# Patient Record
Sex: Male | Born: 1950
Health system: Southern US, Community
[De-identification: ages and names within clinical notes are randomized; demographics above are authoritative.]

## PROBLEM LIST (undated history)

## (undated) DIAGNOSIS — Z87898 Personal history of other specified conditions: Secondary | ICD-10-CM

## (undated) DIAGNOSIS — Z0000 Encounter for general adult medical examination without abnormal findings: Secondary | ICD-10-CM

## (undated) DIAGNOSIS — F1991 Other psychoactive substance use, unspecified, in remission: Secondary | ICD-10-CM

## (undated) DIAGNOSIS — B182 Chronic viral hepatitis C: Secondary | ICD-10-CM

## (undated) DIAGNOSIS — Z8719 Personal history of other diseases of the digestive system: Secondary | ICD-10-CM

## (undated) DIAGNOSIS — K219 Gastro-esophageal reflux disease without esophagitis: Secondary | ICD-10-CM

## (undated) DIAGNOSIS — I10 Essential (primary) hypertension: Secondary | ICD-10-CM

## (undated) DIAGNOSIS — T7840XA Allergy, unspecified, initial encounter: Secondary | ICD-10-CM

## (undated) DIAGNOSIS — F1120 Opioid dependence, uncomplicated: Secondary | ICD-10-CM

## (undated) DIAGNOSIS — R2230 Localized swelling, mass and lump, unspecified upper limb: Secondary | ICD-10-CM

## (undated) DIAGNOSIS — IMO0001 Reserved for inherently not codable concepts without codable children: Secondary | ICD-10-CM

## (undated) DIAGNOSIS — M25512 Pain in left shoulder: Secondary | ICD-10-CM

## (undated) DIAGNOSIS — R030 Elevated blood-pressure reading, without diagnosis of hypertension: Secondary | ICD-10-CM

## (undated) DIAGNOSIS — L989 Disorder of the skin and subcutaneous tissue, unspecified: Secondary | ICD-10-CM

## (undated) HISTORY — DX: Personal history of other specified conditions: Z87.898

## (undated) HISTORY — DX: Opioid dependence, uncomplicated: F11.20

## (undated) HISTORY — DX: Encounter for general adult medical examination without abnormal findings: Z00.00

## (undated) HISTORY — DX: Pain in left shoulder: M25.512

## (undated) HISTORY — DX: Essential (primary) hypertension: I10

## (undated) HISTORY — DX: Gastro-esophageal reflux disease without esophagitis: K21.9

## (undated) HISTORY — DX: Other psychoactive substance use, unspecified, in remission: F19.91

## (undated) HISTORY — DX: Personal history of other diseases of the digestive system: Z87.19

## (undated) HISTORY — DX: Chronic viral hepatitis C: B18.2

## (undated) HISTORY — DX: Reserved for inherently not codable concepts without codable children: IMO0001

## (undated) HISTORY — DX: Allergy, unspecified, initial encounter: T78.40XA

## (undated) HISTORY — DX: Elevated blood-pressure reading, without diagnosis of hypertension: R03.0

## (undated) HISTORY — DX: Localized swelling, mass and lump, unspecified upper limb: R22.30

## (undated) HISTORY — DX: Disorder of the skin and subcutaneous tissue, unspecified: L98.9

---

## 1994-06-12 HISTORY — PX: KNEE ARTHROSCOPY: SUR90

## 2005-03-03 ENCOUNTER — Ambulatory Visit: Payer: Self-pay | Admitting: Internal Medicine

## 2005-03-21 ENCOUNTER — Ambulatory Visit: Payer: Self-pay | Admitting: Internal Medicine

## 2007-01-10 ENCOUNTER — Ambulatory Visit: Payer: Self-pay | Admitting: Internal Medicine

## 2007-01-10 LAB — CONVERTED CEMR LAB
ALT: 58 units/L — ABNORMAL HIGH (ref 0–53)
AST: 57 units/L — ABNORMAL HIGH (ref 0–37)
Albumin: 4.4 g/dL (ref 3.5–5.2)
Basophils Relative: 1.3 % — ABNORMAL HIGH (ref 0.0–1.0)
HCT: 40.3 % (ref 39.0–52.0)
HCV Quantitative: 1710000 intl units/mL — ABNORMAL HIGH (ref <5)
Hemoglobin: 14.1 g/dL (ref 13.0–17.0)
INR: 1 (ref 0.9–2.0)
Monocytes Absolute: 0.3 10*3/uL (ref 0.2–0.7)
Neutrophils Relative %: 51.1 % (ref 43.0–77.0)
Prothrombin Time: 12 s (ref 10.0–14.0)
RDW: 11.9 % (ref 11.5–14.6)
Total Bilirubin: 1.1 mg/dL (ref 0.3–1.2)

## 2007-02-07 ENCOUNTER — Ambulatory Visit: Payer: Self-pay | Admitting: Internal Medicine

## 2007-05-23 ENCOUNTER — Encounter: Payer: Self-pay | Admitting: Internal Medicine

## 2007-05-23 ENCOUNTER — Ambulatory Visit: Payer: Self-pay | Admitting: Gastroenterology

## 2007-07-31 ENCOUNTER — Ambulatory Visit (HOSPITAL_COMMUNITY): Admission: RE | Admit: 2007-07-31 | Discharge: 2007-07-31 | Payer: Self-pay | Admitting: Gastroenterology

## 2007-07-31 ENCOUNTER — Encounter (INDEPENDENT_AMBULATORY_CARE_PROVIDER_SITE_OTHER): Payer: Self-pay | Admitting: Interventional Radiology

## 2007-08-15 ENCOUNTER — Ambulatory Visit: Payer: Self-pay | Admitting: Gastroenterology

## 2007-11-21 ENCOUNTER — Ambulatory Visit: Payer: Self-pay | Admitting: Gastroenterology

## 2008-05-14 ENCOUNTER — Encounter: Payer: Self-pay | Admitting: Internal Medicine

## 2008-05-14 ENCOUNTER — Ambulatory Visit: Payer: Self-pay | Admitting: Gastroenterology

## 2008-07-19 ENCOUNTER — Emergency Department (HOSPITAL_COMMUNITY): Admission: EM | Admit: 2008-07-19 | Discharge: 2008-07-19 | Payer: Self-pay | Admitting: Emergency Medicine

## 2008-07-20 ENCOUNTER — Ambulatory Visit: Payer: Self-pay | Admitting: Internal Medicine

## 2008-07-20 DIAGNOSIS — B182 Chronic viral hepatitis C: Secondary | ICD-10-CM | POA: Insufficient documentation

## 2008-07-20 DIAGNOSIS — M545 Low back pain, unspecified: Secondary | ICD-10-CM | POA: Insufficient documentation

## 2008-08-20 ENCOUNTER — Ambulatory Visit: Payer: Self-pay | Admitting: Internal Medicine

## 2008-08-20 DIAGNOSIS — K1321 Leukoplakia of oral mucosa, including tongue: Secondary | ICD-10-CM | POA: Insufficient documentation

## 2008-08-20 DIAGNOSIS — K121 Other forms of stomatitis: Secondary | ICD-10-CM | POA: Insufficient documentation

## 2008-08-20 DIAGNOSIS — K1230 Oral mucositis (ulcerative), unspecified: Secondary | ICD-10-CM

## 2008-09-04 ENCOUNTER — Ambulatory Visit: Payer: Self-pay | Admitting: Internal Medicine

## 2008-11-12 ENCOUNTER — Ambulatory Visit: Payer: Self-pay | Admitting: Internal Medicine

## 2008-11-12 DIAGNOSIS — M25519 Pain in unspecified shoulder: Secondary | ICD-10-CM | POA: Insufficient documentation

## 2008-12-17 ENCOUNTER — Ambulatory Visit: Payer: Self-pay | Admitting: Internal Medicine

## 2008-12-29 ENCOUNTER — Encounter: Admission: RE | Admit: 2008-12-29 | Discharge: 2009-01-19 | Payer: Self-pay | Admitting: Orthopedic Surgery

## 2009-01-01 ENCOUNTER — Telehealth: Payer: Self-pay | Admitting: Internal Medicine

## 2009-03-26 ENCOUNTER — Ambulatory Visit: Payer: Self-pay | Admitting: Internal Medicine

## 2009-06-07 ENCOUNTER — Telehealth: Payer: Self-pay | Admitting: Internal Medicine

## 2009-06-08 ENCOUNTER — Telehealth: Payer: Self-pay | Admitting: Internal Medicine

## 2009-09-27 ENCOUNTER — Ambulatory Visit: Payer: Self-pay | Admitting: Internal Medicine

## 2009-09-28 ENCOUNTER — Ambulatory Visit (HOSPITAL_BASED_OUTPATIENT_CLINIC_OR_DEPARTMENT_OTHER): Admission: RE | Admit: 2009-09-28 | Discharge: 2009-09-28 | Payer: Self-pay | Admitting: Internal Medicine

## 2009-09-28 ENCOUNTER — Telehealth: Payer: Self-pay | Admitting: Internal Medicine

## 2009-09-28 ENCOUNTER — Ambulatory Visit: Payer: Self-pay | Admitting: Diagnostic Radiology

## 2009-09-28 ENCOUNTER — Encounter: Payer: Self-pay | Admitting: Internal Medicine

## 2009-09-28 DIAGNOSIS — D376 Neoplasm of uncertain behavior of liver, gallbladder and bile ducts: Secondary | ICD-10-CM | POA: Insufficient documentation

## 2009-09-28 LAB — CONVERTED CEMR LAB
Albumin: 4.9 g/dL (ref 3.5–5.2)
BUN: 13 mg/dL (ref 6–23)
CO2: 19 meq/L (ref 19–32)
Calcium: 9.9 mg/dL (ref 8.4–10.5)
Chloride: 103 meq/L (ref 96–112)
Creatinine, Ser: 1.21 mg/dL (ref 0.40–1.50)
Eosinophils Absolute: 0 10*3/uL (ref 0.0–0.7)
Eosinophils Relative: 1 % (ref 0–5)
Glucose, Bld: 99 mg/dL (ref 70–99)
HCT: 45.2 % (ref 39.0–52.0)
Hemoglobin: 15.2 g/dL (ref 13.0–17.0)
Lymphs Abs: 1.1 10*3/uL (ref 0.7–4.0)
MCV: 90.9 fL (ref 78.0–100.0)
Monocytes Absolute: 0.2 10*3/uL (ref 0.1–1.0)
Monocytes Relative: 7 % (ref 3–12)
Platelets: 120 10*3/uL — ABNORMAL LOW (ref 150–400)
Potassium: 4.5 meq/L (ref 3.5–5.3)
WBC: 3.3 10*3/uL — ABNORMAL LOW (ref 4.0–10.5)

## 2009-09-29 ENCOUNTER — Encounter: Payer: Self-pay | Admitting: Internal Medicine

## 2009-09-29 ENCOUNTER — Ambulatory Visit (HOSPITAL_BASED_OUTPATIENT_CLINIC_OR_DEPARTMENT_OTHER): Admission: RE | Admit: 2009-09-29 | Discharge: 2009-09-29 | Payer: Self-pay | Admitting: Internal Medicine

## 2009-09-29 ENCOUNTER — Ambulatory Visit: Payer: Self-pay | Admitting: Diagnostic Radiology

## 2009-09-29 LAB — CONVERTED CEMR LAB: AFP-Tumor Marker: 4.9 ng/mL (ref 0.0–8.0)

## 2009-10-04 ENCOUNTER — Encounter: Payer: Self-pay | Admitting: Internal Medicine

## 2009-10-04 ENCOUNTER — Telehealth (INDEPENDENT_AMBULATORY_CARE_PROVIDER_SITE_OTHER): Payer: Self-pay | Admitting: *Deleted

## 2010-01-26 ENCOUNTER — Ambulatory Visit: Payer: Self-pay | Admitting: Internal Medicine

## 2010-02-11 ENCOUNTER — Encounter: Payer: Self-pay | Admitting: Internal Medicine

## 2010-04-12 ENCOUNTER — Encounter: Payer: Self-pay | Admitting: Internal Medicine

## 2010-04-12 LAB — CONVERTED CEMR LAB
BUN: 12 mg/dL (ref 6–23)
Creatinine, Ser: 1.11 mg/dL (ref 0.40–1.50)

## 2010-04-16 ENCOUNTER — Ambulatory Visit: Payer: Self-pay | Admitting: Diagnostic Radiology

## 2010-04-16 ENCOUNTER — Ambulatory Visit (HOSPITAL_BASED_OUTPATIENT_CLINIC_OR_DEPARTMENT_OTHER): Admission: RE | Admit: 2010-04-16 | Discharge: 2010-04-16 | Payer: Self-pay | Admitting: Internal Medicine

## 2010-04-18 ENCOUNTER — Telehealth: Payer: Self-pay | Admitting: Internal Medicine

## 2010-06-10 ENCOUNTER — Telehealth: Payer: Self-pay | Admitting: Internal Medicine

## 2010-07-03 ENCOUNTER — Encounter: Payer: Self-pay | Admitting: Gastroenterology

## 2010-07-04 ENCOUNTER — Encounter: Payer: Self-pay | Admitting: Internal Medicine

## 2010-07-12 NOTE — Progress Notes (Signed)
Summary: MRI Results  Phone Note Outgoing Call   Summary of Call: call pt - MRI of liver - negative for liver cancer Initial call taken by: D. Thomos Lemons DO,  April 18, 2010 2:10 PM  Follow-up for Phone Call        call placed to patient at [phone]. Patients wife Lynden Ang states patient was at work. She has been advised per Dr Artist Pais instructions, and states she will inform patient when he returns home Follow-up by: Glendell Docker CMA,  April 18, 2010 3:38 PM

## 2010-07-12 NOTE — Assessment & Plan Note (Signed)
Summary: 3 month follow up/mhf   Vital Signs:  Patient profile:   60 year old male Weight:      166.25 pounds BMI:     22.63 O2 Sat:      98 % on Room air Temp:     98.2 degrees F oral Pulse rate:   71 / minute Pulse rhythm:   regular Resp:     16 per minute BP sitting:   128 / 70  (right arm) Cuff size:   regular  Vitals Entered By: Glendell Docker CMA (January 26, 2010 8:27 AM)  O2 Flow:  Room air CC: 3 Month Follow up  Is Patient Diabetic? No Pain Assessment Patient in pain? no      Comments No concerns, medication for mouth sores are working well   Primary Care Ichael Pullara:  D. Thomos Lemons DO  CC:  3 Month Follow up .  History of Present Illness: 60 y/o white male with hep c for f/u surveillance liver US was abnormal ,  abd MRI obtained MRI of abd IMPRESSION: 1.   Motion degraded exam.  The motion is most severe involving the arterial phase postcontrast series.  This decreases sensitivity for dysplastic nodule or early hepatocellular carcinoma. 2.  Given this factor, 2 hepatic hemangiomas without evidence of cirrhosis or suspicious liver lesion. 3.   Intra and extrahepatic biliary ductal dilatation without cause identified.  Correlate with bilirubin levels.  If these are elevated, consider further evaluation with ERCP to exclude ampullary stenosis or otherwise occult ampullary lesion.  bilirubin levels normal.  AFP levels normal feels well overall.   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   Eyes:  pupils equal, pupils round, and pupils reactive to light.   Lungs:  normal respiratory effort and normal breath sounds.   Heart:  normal rate, regular rhythm, and no gallop.   Abdomen:  soft, non-tender, no masses, no hepatomegaly, and no splenomegaly.     Impression & Recommendations:  Problem # 1:  HEPATITIS C, CHRONIC (ICD-070.54) Limited MRI of Abd in 09/2009.  AFP level normal. Bilirubin level normal  plan - repeat MRI of abd in Oct. we discussed new medication for tx of hep c.  refer again to Hep C clinic IMPRESSION: 1.   Motion degraded exam.  The motion is most severe involving the arterial phase postcontrast series.  This decreases sensitivity for dysplastic nodule or early hepatocellular carcinoma. 2.  Given this factor, 2 hepatic hemangiomas without evidence of cirrhosis or suspicious liver lesion. 3.   Intra and extrahepatic biliary ductal dilatation without cause identified.  Correlate with bilirubin levels.  If these are elevated, consider further evaluation with ERCP to exclude ampullary stenosis or otherwise occult ampullary lesion. Orders: Misc. Referral (Misc. Ref)  Problem # 2:  LEUKOPLAKIA OF ORAL MUCOSA INCLUDING TONGUE (ICD-528.6) Assessment: Improved  Problem # 3:  LIVER MASS (ICD-235.3) MRI of liver 09/2009 showed. plan on repeat MRI of abd next year and AFP  IMPRESSION: 1.   Motion degraded exam.  The motion is most severe involving the arterial phase postcontrast series.  This decreases sensitivity for dysplastic nodule or early hepatocellular carcinoma. 2.  Given this factor, 2 hepatic hemangiomas without evidence of cirrhosis or suspicious liver lesion. 3.   Intra and extrahepatic biliary ductal dilatation without cause identified.  Correlate with bilirubin levels.  If these are elevated, consider further evaluation with ERCP to exclude ampullary stenosis or otherwise occult ampullary lesion.  Complete Medication  List: 1)  Methadone Hcl 10 Mg/30ml Soln (Methadone hcl) .... 14mg  by mouth once daily 2)  Advil 200 Mg Tabs (Ibuprofen) .... Take 1 tablet by mouth three times a day as needed 3)  Fluocinonide 0.05 % Gel (Fluocinonide) .... Use two times a day as directed 4)  Lidocaine Viscous 2 % Soln (Lidocaine hcl) .... 5 ml two times a day as needed (swish and spit)  Patient Instructions: 1)  Please schedule a follow-up appointment in 6 months. Prescriptions: LIDOCAINE VISCOUS 2 % SOLN (LIDOCAINE HCL) 5 ml two times a day as needed (swish and spit)  #60 ml x 3   Entered and Authorized by:   D. Thomos Lemons DO   Signed by:   D. Thomos Lemons DO on 01/26/2010   Method used:   Electronically to        [facility]* (retail)       [facility]. [address]       [place], [place]  [address]       Ph: [phone] or [phone]       Fax: [phone]   RxID:   [id number] FLUOCINONIDE 0.05 % GEL (FLUOCINONIDE) use two times a day as directed  #30 x 1   Entered and Authorized by:   D. Thomos Lemons DO   Signed by:   D. Thomos Lemons DO on 01/26/2010   Method used:   Electronically to        [facility]* (retail)       [facility]. [address]       [place], [place]  [address]       Ph: [phone] or [phone]       Fax: [phone]   RxID:   [id number]   Current Allergies (reviewed today): No known allergies

## 2010-07-12 NOTE — Assessment & Plan Note (Signed)
Summary: 6 mo. f/u - jr   Vital Signs:  Patient profile:   60 year old [name] Height:      72 inches Weight:      171.25 pounds BMI:     23.31 O2 Sat:      98 % on Room air Temp:     98.0 degrees F oral Pulse rate:   75 / minute Pulse rhythm:   regular Resp:     16 per minute BP sitting:   136 / 70  (right arm) Cuff size:   large  Vitals Entered By: Glendell Docker CMA (September 27, 2009 9:51 AM)  O2 Flow:  Room air CC: Rm 3- 6 Month Follow up  Comments no changes per patient   Primary Care Provider:  Dondra Spry DO  CC:  Rm 3- 6 Month Follow up .  History of Present Illness: Shaun [name] with chronic hep c for f/u still has interittent issues with mouth soreness  hep c - does not have reg f/u with hep c clinic no jaundice,  no wt changes,  no anorexia  Preventive Screening-Counseling & Management  Alcohol-Tobacco     Smoking Status: never  Allergies (verified): No Known Drug Allergies  Past History:  Past Medical History: Chronic Hepatitis C  Hx of IV Drug use    Negative HIV screening the past.     Social History: Married Current Smoker  Alcohol use-no   Occupation: [employer]      Physical Exam  General:  alert, well-developed, and well-nourished.   Lungs:  normal respiratory effort and normal breath sounds.   Heart:  normal rate, regular rhythm, and no gallop.   Abdomen:  soft, non-tender, no masses, no hepatomegaly, and no splenomegaly.     Impression & Recommendations:  Problem # 1:  HEPATITIS C, CHRONIC (ICD-070.54) He has not followed with Hep C clinic.  follow LFTs.  screen for [facility] with alpha feto protein and liver US Orders: T-Comprehensive Metabolic Panel ([id number]) T-CBC w/Diff ([id number]) T- * Misc. Laboratory test [phone]) Ultrasound (Ultrasound)  Problem # 2:  LEUKOPLAKIA OF ORAL MUCOSA INCLUDING TONGUE (ICD-528.6) Pt likely has licen planus.  He has intermittent soreness of the mouth and tongue.  trial of fluocinonide  gel  Complete Medication List: 1)  Methadone Hcl 10 Mg/6ml Soln (Methadone hcl) .... 14mg  by mouth once daily 2)  Advil 200 Mg Tabs (Ibuprofen) .... Take 1 tablet by mouth three times a day as needed 3)  Fluocinonide 0.05 % Gel (Fluocinonide) .... Use two times a day as directed  Patient Instructions: 1)  Please schedule a follow-up appointment in 3 months. 2)  Call our office if sores in the mouth get worse Prescriptions: FLUOCINONIDE 0.05 % GEL (FLUOCINONIDE) use two times a day as directed  #30 grams x 3   Entered and Authorized by:   D. Thomos Lemons DO   Signed by:   D. Thomos Lemons DO on 09/27/2009   Method used:   Electronically to        [facility]. [phone]* (retail)       [address]       [place], [place]  [address]       Ph: [phone]       Fax: [phone]   RxID:   [id number]   Current Allergies (reviewed today): No known allergies

## 2010-07-12 NOTE — Progress Notes (Signed)
Summary: Lab Testing  ---- Converted from flag ---- ---- 09/28/2009 9:25 AM, D. Thomos Lemons DO wrote: please make sure alpha fetoprotein lab order completed ------------------------------  Phone Note Outgoing Call Call back at [phone]   Call placed by: Glendell Docker CMA,  September 28, 2009 10:11 AM Call placed to: [facility] Summary of Call: Spoke with Dorene Grebe with [facility] she states the test for Hepatitis C  Reba is currently not being performed anywhere in the [place] and a letter regarding that shoudl be received by our office. She did state  there  alternative tests;  Hepatitis antibody, or pcr qualitative or quantitative could be performed. She also states that Alpha fetoprotein was not found on any of the patients lab orders, however the test could be added. If added a diagnosis will be needed.  Initial call taken by: Glendell Docker CMA,  September 28, 2009 10:15 AM  Follow-up for Phone Call        I just need alpha fetoprotein.  use hep c code Follow-up by: D. Thomos Lemons DO,  September 28, 2009 4:49 PM  Additional Follow-up for Phone Call Additional follow up Details #1::        spoke with Marylene Land at [facility] for the [employer] added ([id number]) Additional Follow-up by: Glendell Docker CMA,  September 29, 2009 10:25 AM

## 2010-07-12 NOTE — Miscellaneous (Signed)
Summary: BUN/Creatinine prior to MRI  Clinical Lists Changes  Orders: Added new Test order of T-BUN [phone]) - Signed Added new Test order of T-Creatinine Blood [phone]) - Signed

## 2010-07-12 NOTE — Progress Notes (Signed)
Summary: MRI Resuts  Phone Note Call from Patient Call back at Work Phone [phone]   Caller: Patient Call For: D. Thomos Lemons DO Reason for Call: Talk to Nurse Summary of Call: Pt is anxious to know results from MRI, pls call to advise Initial call taken by: Lannette Donath,  October 04, 2009 2:27 PM  Follow-up for Phone Call        MRI of liver suboptimal quality due to motion degradation but no discrete mass identified.  I suggest repeat MRI of abd in 6 months alpha feto protein - tumor marker was normal Follow-up by: D. Thomos Lemons DO,  October 04, 2009 3:03 PM  Additional Follow-up for Phone Call Additional follow up Details #1::        patient advised per Dr Artist Pais instructions Additional Follow-up by: Glendell Docker CMA,  October 05, 2009 11:29 AM

## 2010-07-12 NOTE — Progress Notes (Signed)
Summary: Test Results  Phone Note Outgoing Call   Summary of Call: call pt - u/s of liver shows abnormality - could be hemangioma (benign tumor) but given hx of hep c, radiologist recommends MRI of liver.  see orders Initial call taken by: D. Thomos Lemons DO,  September 28, 2009 4:51 PM  Follow-up for Phone Call        patient advised per Dr Artist Pais instructions  Follow-up by: Glendell Docker CMA,  September 29, 2009 10:37 AM  New Problems: LIVER MASS (ICD-235.3)   New Problems: LIVER MASS (ICD-235.3)

## 2010-07-14 NOTE — Progress Notes (Signed)
Summary: Fluocinonide Gel Refill  Phone Note Refill Request Message from:  Fax from Pharmacy on June 10, 2010 2:56 PM  Refills Requested: Medication #1:  FLUOCINONIDE 0.05 % GEL use two times a day as directed   Dosage confirmed as above?Dosage Confirmed   Brand Name Necessary? No   Supply Requested: 1 month   Last Refilled: 05/03/2010 [facility] [address] [place] [phone]    Method Requested: Electronic Next Appointment Scheduled: 07-20-10 Dr Artist Pais  Initial call taken by: Roselle Locus,  June 10, 2010 2:57 PM  Follow-up for Phone Call        Rx completed in Dr. Tiajuana Amass Follow-up by: Glendell Docker CMA,  June 10, 2010 3:38 PM    Prescriptions: FLUOCINONIDE 0.05 % GEL (FLUOCINONIDE) use two times a day as directed  #30 x 0   Entered by:   Glendell Docker CMA   Authorized by:   D. Thomos Lemons DO   Signed by:   Glendell Docker CMA on 06/10/2010   Method used:   Electronically to        [facility]* (retail)       [address] [place]       [place], [place]  [address]       Ph: [phone] or [phone]       Fax: [phone]   RxID:   [id number]

## 2010-07-20 ENCOUNTER — Encounter: Payer: Self-pay | Admitting: Internal Medicine

## 2010-07-20 ENCOUNTER — Ambulatory Visit (INDEPENDENT_AMBULATORY_CARE_PROVIDER_SITE_OTHER): Payer: 59 | Admitting: Internal Medicine

## 2010-07-20 DIAGNOSIS — K1321 Leukoplakia of oral mucosa, including tongue: Secondary | ICD-10-CM

## 2010-07-20 DIAGNOSIS — B182 Chronic viral hepatitis C: Secondary | ICD-10-CM

## 2010-07-20 LAB — CONVERTED CEMR LAB
AFP-Tumor Marker: 4.4 ng/mL (ref 0.0–8.0)
ALT: 55 units/L — ABNORMAL HIGH (ref 0–53)
Alkaline Phosphatase: 54 units/L (ref 39–117)
Bilirubin, Direct: 0.2 mg/dL (ref 0.0–0.3)
CO2: 27 meq/L (ref 19–32)
Chloride: 101 meq/L (ref 96–112)
Creatinine, Ser: 1.1 mg/dL (ref 0.40–1.50)
HCV Quantitative: 2400000 intl units/mL — ABNORMAL HIGH (ref <43)
Indirect Bilirubin: 0.5 mg/dL (ref 0.0–0.9)
Platelets: 124 10*3/uL — ABNORMAL LOW (ref 150–400)
RDW: 12.6 % (ref 11.5–15.5)
Sodium: 140 meq/L (ref 135–145)
Total Protein: 7.2 g/dL (ref 6.0–8.3)
WBC: 3 10*3/uL — ABNORMAL LOW (ref 4.0–10.5)

## 2010-07-21 ENCOUNTER — Encounter (INDEPENDENT_AMBULATORY_CARE_PROVIDER_SITE_OTHER): Payer: Self-pay | Admitting: *Deleted

## 2010-07-26 ENCOUNTER — Encounter: Payer: Self-pay | Admitting: Internal Medicine

## 2010-07-26 ENCOUNTER — Encounter (INDEPENDENT_AMBULATORY_CARE_PROVIDER_SITE_OTHER): Payer: Self-pay | Admitting: *Deleted

## 2010-07-28 ENCOUNTER — Encounter: Payer: Self-pay | Admitting: Internal Medicine

## 2010-07-28 NOTE — Letter (Signed)
Summary: Pre Visit Letter Revised  [facility]  [address] [place], [place] [address]   Phone: [phone]  Fax: [phone]        07/21/2010 MRN: [id number] Shaun [name] [address] [place], [place]  [address]  [place]             Procedure Date:  August 11, 2010   dir col-Dr Dalene Carrow to the Gastroenterology Division at [facility].    You are scheduled to see a nurse for your pre-procedure visit on July 28, 2010 at 11:00am on the 3rd floor at [facility], [address]  We ask that you try to arrive at our office 15 minutes prior to your appointment time to allow for check-in.  Please take a minute to review the attached form.  If you answer "Yes" to one or more of the questions on the first page, we ask that you call the person listed at your earliest opportunity.  If you answer "No" to all of the questions, please complete the rest of the form and bring it to your appointment.    Your nurse visit will consist of discussing your medical and surgical history, your immediate family medical history, and your medications.   If you are unable to list all of your medications on the form, please bring the medication bottles to your appointment and we will list them.  We will need to be aware of both prescribed and over the counter drugs.  We will need to know exact dosage information as well.    Please be prepared to read and sign documents such as consent forms, a financial agreement, and acknowledgement forms.  If necessary, and with your consent, a friend or relative is welcome to sit-in on the nurse visit with you.  Please bring your insurance card so that we may make a copy of it.  If your insurance requires a referral to see a specialist, please bring your referral form from your primary care physician.  No co-pay is required for this nurse visit.     If you cannot keep your appointment, please call [phone] to cancel or reschedule  prior to your appointment date.  This allows [place] the opportunity to schedule an appointment for another patient in need of care.    Thank you for choosing  Gastroenterology for your medical needs.  We appreciate the opportunity to care for you.  Please visit [place] at our website  to learn more about our practice.  Sincerely, The Gastroenterology Division

## 2010-07-28 NOTE — Assessment & Plan Note (Signed)
Summary: 6 month follow up/mhf   Vital Signs:  Patient profile:   60 year old male Height:      72 inches Weight:      171 pounds BMI:     23.28 O2 Sat:      97 % on Room air Temp:     98.4 degrees F oral Pulse rate:   67 / minute Resp:     18 per minute BP sitting:   130 / 70  (right arm) Cuff size:   regular  Vitals Entered By: Glendell Docker CMA (July 20, 2010 8:28 AM)  O2 Flow:  Room air CC: 6 Month Follow up  Is Patient Diabetic? No Pain Assessment Patient in pain? no         Primary Care Provider:  Dondra Spry DO  CC:  6 Month Follow up .  History of Present Illness:       This is a 60 year old male who presents with Hepatitis C follow-up.  The patient complains of no newsymptoms, but denies weight loss, weight gain, and abdominal pain.  Evaluation to date has included Hepatitis serology, HIV testing, HCV viral level, and MRI.    pt seen by hep c clinic in [place] in the past.  pt unable to get appt due to waiting list    Preventive Screening-Counseling & Management  Alcohol-Tobacco     Smoking Status: never  Allergies (verified): No Known Drug Allergies  Past History:  Past Medical History: Chronic Hepatitis C  Hx of IV Drug use     Negative HIV screening the past.      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   Head:  normocephalic and atraumatic.   Eyes:  pupils equal, pupils round, and pupils reactive to light.  no scleral icterus Mouth:  pharynx pink and moist.   Lungs:  normal respiratory effort and normal breath sounds.   Heart:  normal rate, regular rhythm, no murmur, and no gallop.     Impression &  Recommendations:  Problem # 1:  HEPATITIS C, CHRONIC (ICD-070.54) Pt unable to get appt at Hep C clinic in [place] refer to Dr. Marcelene Butte in [place]. Hep C genotype 1 obtain updated LFTs,  viral load, and AFP pt previously vaccinated for Hep A and Hep B (pt to forward copies of vaccine records) pt given copy of last MRI of Liver to bring with him to specialist appt  Orders: Gastroenterology Referral (GI) T-Basic Metabolic Panel [phone]) T-Hepatic Function [phone]) T-CBC No Diff ([id number]) T-Hepatitis C Viral Load ([id number]) T- * Misc. Laboratory test [id number])  Problem # 2:  STOMATITIS AND MUCOSITIS UNSPECIFIED (ICD-528.00) Assessment: Unchanged controlled with use of fluocinonide gel pt will call when he needs refill  Problem # 3:  PREVENTIVE HEALTH CARE (ICD-V70.0) refer to GI for screening colonoscopy  Orders: Gastroenterology Referral (GI)  Td Booster: Tdap (03/26/2009)   Flu Vax: Fluvax Non-MCR (03/26/2009)   Pneumovax: Pneumovax (07/20/2008)  Complete Medication List: 1)  Methadone Hcl 10 Mg/74ml Soln (Methadone hcl) .... 14mg  by mouth once daily 2)  Advil 200 Mg Tabs (Ibuprofen) .... Take 1  tablet by mouth three times a day as needed 3)  Fluocinonide 0.05 % Gel (Fluocinonide) .... Use two times a day as directed 4)  Lidocaine Viscous 2 % Soln (Lidocaine hcl) .... 5 ml two times a day as needed (swish and spit)  Other Orders: T-PSA ([id number])  Patient Instructions: 1)  Please schedule a follow-up appointment in 6 months.   Orders Added: 1)  Gastroenterology Referral [GI] 2)  T-Basic Metabolic Panel [[id number]] 3)  T-Hepatic Function [[id number]] 4)  T-CBC No Diff [[id number]] 5)  T-Hepatitis C Viral Load [[id number]] 6)  T- * Misc. Laboratory test [99999] 7)  T-PSA [phone] 8)  Gastroenterology Referral [GI] 9)  Est. Patient Level III [30865]    Current Allergies (reviewed today): No known allergies

## 2010-08-03 NOTE — Letter (Signed)
Summary: [facility] Instructions  [facility]  [address] [place], [place] [address]   Phone: [phone]  Fax: [phone]       Shaun [name]    11-19-1950    MRN: [id number]        Procedure Day /Date:  Friday 08/12/10     Arrival Time: 1:00pm      Procedure Time:  2:00pm     Location of Procedure:                    [facility] _  [facility] (4th Floor)                       PREPARATION FOR COLONOSCOPY WITH MOVIPREP   Starting 5 days prior to your procedure  SATURDAY 02/25  do not eat nuts, seeds, popcorn, corn, beans, peas,  salads, or any raw vegetables.  Do not take any fiber supplements (e.g. Metamucil, Citrucel, and Benefiber).  THE DAY BEFORE YOUR PROCEDURE         DATE: Thursday 3/1  1.  Drink clear liquids the entire day-NO SOLID FOOD  2.  Do not drink anything colored red or purple.  Avoid juices with pulp.  No orange juice.  3.  Drink at least 64 oz. (8 glasses) of fluid/clear liquids during the day to prevent dehydration and help the prep work efficiently.  CLEAR LIQUIDS INCLUDE: Water Jello Ice Popsicles Tea (sugar ok, no milk/cream) Powdered fruit flavored drinks Coffee (sugar ok, no milk/cream) Gatorade Juice: apple, white grape, white cranberry  Lemonade Clear bullion, consomm, broth Carbonated beverages (any kind) Strained chicken noodle soup Hard Candy                             4.  In the morning, mix first dose of MoviPrep solution:    Empty 1 Pouch A and 1 Pouch B into the disposable container    Add lukewarm drinking water to the top line of the container. Mix to dissolve    Refrigerate (mixed solution should be used within 24 hrs)  5.  Begin drinking the prep at 5:00 p.m. The MoviPrep container is divided by 4 marks.   Every 15 minutes drink the solution down to the next mark (approximately 8 oz) until the full liter is complete.   6.  Follow completed prep with 16 oz of clear liquid of your choice  (Nothing red or purple).  Continue to drink clear liquids until bedtime.  7.  Before going to bed, mix second dose of MoviPrep solution:    Empty 1 Pouch A and 1 Pouch B into the disposable container    Add lukewarm drinking water to the top line of the container. Mix to dissolve    Refrigerate  THE DAY OF YOUR PROCEDURE      DATE: Friday 3/2  Beginning at  9:00 a.m. (5 hours before procedure):         1. Every 15 minutes, drink the solution down to the next mark (approx 8 oz) until the full liter is complete.  2. Follow completed prep with 16 oz. of clear liquid of your choice.    3. You may drink clear liquids until  12:00pm  (2 HOURS BEFORE PROCEDURE).   MEDICATION INSTRUCTIONS  Unless otherwise instructed, you should take regular prescription medications with a small sip of water   as early as possible the  morning of your procedure.        OTHER INSTRUCTIONS  You will need a responsible adult at least 61 years of age to accompany you and drive you home.   This person must remain in the waiting room during your procedure.  Wear loose fitting clothing that is easily removed.  Leave jewelry and other valuables at home.  However, you may wish to bring a book to read or  an iPod/MP3 player to listen to music as you wait for your procedure to start.  Remove all body piercing jewelry and leave at home.  Total time from sign-in until discharge is approximately 2-3 hours.  You should go home directly after your procedure and rest.  You can resume normal activities the  day after your procedure.  The day of your procedure you should not:   Drive   Make legal decisions   Operate machinery   Drink alcohol   Return to work  You will receive specific instructions about eating, activities and medications before you leave.    The above instructions have been reviewed and explained to me by   Ezra Sites RN  July 28, 2010 11:32 AM    I fully understand and  can verbalize these instructions _____________________________ Date _________

## 2010-08-03 NOTE — Letter (Signed)
   [place] at [facility] [address] [place], [place]  [address]  [place] Phone: [phone]      July 26, 2010   FLEET HIGHAM [address] [place], [place] [address]  RE:  LAB RESULTS  Dear  Mr. HOH,  The following is an interpretation of your most recent lab tests.  Please take note of any instructions provided or changes to medications that have resulted from your lab work.  PSA:  normal - no follow-up needed PSA: 0.67  ELECTROLYTES:  Good - no changes needed  KIDNEY FUNCTION TESTS:  Good - no changes needed  LIVER FUNCTION TESTS:  Stable - no changes needed  CBC:  Stable - no changes needed  AFP ( tumor marker ) - normal       Sincerely Yours,    Dr. Thomos Lemons  Appended Document:  mailed

## 2010-08-03 NOTE — Miscellaneous (Signed)
Summary: [facility]- pt needs Propofol  Clinical Lists Changes  Medications: Added new medication of MOVIPREP 100 GM  SOLR (PEG-KCL-NACL-NASULF-NA ASC-C) As per prep instructions. - Signed Rx of MOVIPREP 100 GM  SOLR (PEG-KCL-NACL-NASULF-NA ASC-C) As per prep instructions.;  #1 x 0;  Signed;  Entered by: Ezra Sites RN;  Authorized by: Hart Carwin MD;  Method used: Electronically to [facility]*, [address], [place], [place]  [address], Ph: [phone] or [phone], Fax: [phone] Observations: Added new observation of NKA: T (07/28/2010 10:44)  Pt has been taking Methadone for 20 years because of history of IV drug use.  Pt scheduled for colonoscopy w/ Propofol 08/12/2010.  Prescriptions: MOVIPREP 100 GM  SOLR (PEG-KCL-NACL-NASULF-NA ASC-C) As per prep instructions.  #1 x 0   Entered by:   Ezra Sites RN   Authorized by:   Hart Carwin MD   Signed by:   Ezra Sites RN on 07/28/2010   Method used:   Electronically to        [facility]* (retail)       [address]. [address]       [place], [place]  [address]       Ph: [phone] or [phone]       Fax: [phone]   RxID:   [id number]

## 2010-08-03 NOTE — Miscellaneous (Signed)
Summary: for colon/needs Propofol  Clinical Lists Changes   Pt has history of IV drug use. He has not used IV drugs in 20 years.  He has been taking Methadone 12 mg. daily for 20 years.  Pt scheduled for colonoscopy w/ Propofol 08/12/2010 at 2:00 at [facility].

## 2010-08-09 NOTE — Letter (Signed)
Summary: [facility]   Imported By: Maryln Gottron 08/04/2010 13:36:42  _____________________________________________________________________  External Attachment:    Type:   Image     Comment:   External Document

## 2010-08-11 ENCOUNTER — Other Ambulatory Visit: Payer: 59 | Admitting: Internal Medicine

## 2010-08-12 ENCOUNTER — Encounter (AMBULATORY_SURGERY_CENTER): Payer: BC Managed Care – PPO | Admitting: Internal Medicine

## 2010-08-12 ENCOUNTER — Encounter: Payer: Self-pay | Admitting: Internal Medicine

## 2010-08-12 DIAGNOSIS — Z1211 Encounter for screening for malignant neoplasm of colon: Secondary | ICD-10-CM

## 2010-08-12 LAB — HM COLONOSCOPY

## 2010-08-18 NOTE — Procedures (Signed)
Summary: Colonoscopy  Patient: [name] Note: All result statuses are Final unless otherwise noted.  Tests: (1) Colonoscopy (COL)   COL Colonoscopy           DONE     [facility]     [address]     [place], [place]  [address]           COLONOSCOPY PROCEDURE REPORT           PATIENT:  Shaun, [name]  MR#:  [id number]     BIRTHDATE:  1951-02-03, 59 yrs. old  GENDER:  male     ENDOSCOPIST:  Hedwig Morton. Juanda Chance, MD     REF. BY:  Thomos Lemons, DO     PROCEDURE DATE:  08/12/2010     PROCEDURE:  Colonoscopy 19147     ASA CLASS:  Class II     INDICATIONS:  colorectal cancer screening, average risk     MEDICATIONS:   propofol (Diprivan) 380 mg IV           DESCRIPTION OF PROCEDURE:   After the risks benefits and     alternatives of the procedure were thoroughly explained, informed     consent was obtained.  Digital rectal exam was performed and     revealed no rectal masses.   The LB [id number]-[id number] endoscope     was introduced through the anus and advanced to the cecum, which     was identified by both the appendix and ileocecal valve, without     limitations.  The quality of the prep was good, using MiraLax.     The instrument was then slowly withdrawn as the colon was fully     examined.     <<PROCEDUREIMAGES>>           FINDINGS:  No polyps or cancers were seen (see image1, image2,     image3, and image4).   Retroflexed views in the rectum revealed no     abnormalities.    The scope was then withdrawn from the patient     and the procedure completed.           COMPLICATIONS:  None     ENDOSCOPIC IMPRESSION:     1) No polyps or cancers     2) Normal colonoscopy     RECOMMENDATIONS:     1) high fiber diet     REPEAT EXAM:  In 10 year(s) for.           ______________________________     Hedwig Morton. Juanda Chance, MD           CC:           n.     eSIGNED:   Hedwig Morton. Orena Cavazos at 08/12/2010 02:53 PM           [name], [phone]  Note: An exclamation  mark (!) indicates a result that was not dispersed into the flowsheet. Document Creation Date: 08/12/2010 2:54 PM _______________________________________________________________________  (1) Order result status: Final Collection or observation date-time: 08/12/2010 14:48 Requested date-time:  Receipt date-time:  Reported date-time:  Referring Physician:   Ordering Physician: Lina Sar [phone]) Specimen Source:  Source: Launa Grill Order Number: [id number] Lab site:   Appended Document: Colonoscopy    Clinical Lists Changes  Observations: Added new observation of COLONNXTDUE: 08/2020 (08/12/2010 16:16)

## 2010-09-27 LAB — URINALYSIS, ROUTINE W REFLEX MICROSCOPIC
Bilirubin Urine: NEGATIVE
Hgb urine dipstick: NEGATIVE
Nitrite: NEGATIVE
Specific Gravity, Urine: 1.016 (ref 1.005–1.030)
Urobilinogen, UA: 0.2 mg/dL (ref 0.0–1.0)
pH: 6 (ref 5.0–8.0)

## 2010-11-03 ENCOUNTER — Encounter: Payer: Self-pay | Admitting: Internal Medicine

## 2010-12-03 ENCOUNTER — Emergency Department (HOSPITAL_COMMUNITY): Payer: BC Managed Care – PPO

## 2010-12-03 ENCOUNTER — Emergency Department (HOSPITAL_BASED_OUTPATIENT_CLINIC_OR_DEPARTMENT_OTHER): Payer: BC Managed Care – PPO

## 2010-12-03 ENCOUNTER — Emergency Department (HOSPITAL_BASED_OUTPATIENT_CLINIC_OR_DEPARTMENT_OTHER)
Admission: EM | Admit: 2010-12-03 | Discharge: 2010-12-03 | Disposition: A | Discharge status: Home / Self Care | Payer: BC Managed Care – PPO | Attending: Emergency Medicine | Admitting: Emergency Medicine

## 2010-12-03 ENCOUNTER — Emergency Department (INDEPENDENT_AMBULATORY_CARE_PROVIDER_SITE_OTHER): Payer: BC Managed Care – PPO

## 2010-12-03 DIAGNOSIS — R609 Edema, unspecified: Secondary | ICD-10-CM

## 2010-12-03 DIAGNOSIS — F17200 Nicotine dependence, unspecified, uncomplicated: Secondary | ICD-10-CM | POA: Insufficient documentation

## 2010-12-03 DIAGNOSIS — W208XXA Other cause of strike by thrown, projected or falling object, initial encounter: Secondary | ICD-10-CM | POA: Insufficient documentation

## 2010-12-03 DIAGNOSIS — M25579 Pain in unspecified ankle and joints of unspecified foot: Secondary | ICD-10-CM

## 2010-12-03 DIAGNOSIS — S8010XA Contusion of unspecified lower leg, initial encounter: Secondary | ICD-10-CM | POA: Insufficient documentation

## 2010-12-23 ENCOUNTER — Telehealth: Payer: Self-pay | Admitting: Internal Medicine

## 2010-12-23 MED ORDER — FLUOCINONIDE 0.05 % EX GEL: Freq: Two times a day (BID) | CUTANEOUS | Status: DC

## 2010-12-23 NOTE — Telephone Encounter (Signed)
Refill- fluocinonide 0.05% gel. Use twice daily, as directed. Qty 30gm. Last fill 11.22.11

## 2011-01-13 ENCOUNTER — Encounter: Payer: Self-pay | Admitting: Internal Medicine

## 2011-01-18 ENCOUNTER — Ambulatory Visit: Payer: 59 | Admitting: Internal Medicine

## 2011-01-18 ENCOUNTER — Telehealth: Payer: Self-pay | Admitting: *Deleted

## 2011-01-18 ENCOUNTER — Ambulatory Visit (INDEPENDENT_AMBULATORY_CARE_PROVIDER_SITE_OTHER): Payer: BC Managed Care – PPO | Admitting: Internal Medicine

## 2011-01-18 ENCOUNTER — Encounter: Payer: Self-pay | Admitting: Internal Medicine

## 2011-01-18 DIAGNOSIS — B182 Chronic viral hepatitis C: Secondary | ICD-10-CM

## 2011-01-18 DIAGNOSIS — K121 Other forms of stomatitis: Secondary | ICD-10-CM

## 2011-01-18 MED ORDER — MAGIC MOUTHWASH W/LIDOCAINE: 5.0000 mL | Freq: Three times a day (TID) | ORAL | Status: DC | PRN

## 2011-01-18 NOTE — Patient Instructions (Signed)
Please schedule chem7, lft, cbc (070.54) prior to next visit

## 2011-01-18 NOTE — Progress Notes (Signed)
  Subjective:    Patient ID: Shaun [name], male    DOB: 17-Dec-1950, 60 y.o.   MRN: [id number]  HPI Pt presents to clinic for followup of multiple medical problems.  H/o hep c now s/p gi evaluation. Records currently not available however pt indicates liver bx was stage one with no recommendation for interferon tx or gi followup.  Plt count has been mildly depressed in past. Denies gross active bleeding, blood in stool, nose bleeds, or hematuria. Notes h/o recurrent mouth ulcers with no recent flare. Previously tx'ed symptomatically with magic mouthwash with improved comfort. No other complaints.  Reviewed pmh, medications and allergies    Review of Systems  Constitutional: Negative for fever.  HENT: Negative for sore throat, mouth sores and trouble swallowing.   Gastrointestinal: Negative for abdominal pain and blood in stool.  Genitourinary: Negative for hematuria.       Objective:   Physical Exam  Nursing note and vitals reviewed. Constitutional: He appears well-developed and well-nourished. No distress.  HENT:  Head: Normocephalic and atraumatic.  Right Ear: External ear normal.  Left Ear: External ear normal.  Eyes: Conjunctivae are normal. No scleral icterus.  Neck: Neck supple. Carotid bruit is not present.  Cardiovascular: Normal rate, regular rhythm and normal heart sounds.  Exam reveals no gallop and no friction rub.   No murmur heard. Pulmonary/Chest: Effort normal and breath sounds normal. No respiratory distress. He has no wheezes. He has no rales.  Abdominal: Soft. Bowel sounds are normal. He exhibits no distension and no mass. There is no hepatosplenomegaly. There is no tenderness. There is no rebound and no guarding.  Neurological: He is alert.  Skin: Skin is warm and dry. He is not diaphoretic.  Psychiatric: He has a normal mood and affect.          Assessment & Plan:

## 2011-01-18 NOTE — Telephone Encounter (Signed)
Pharmacist called on rx received for magic mouth wash. He would like to know what components that provider is wanting the patient to have. He was not sure if it was to include a antibiotic. Please advise.

## 2011-01-18 NOTE — Assessment & Plan Note (Signed)
Stable. Asx. S/p GI consult with no apparent recommendation for treatment. Schedule f/u lft's with next visit.

## 2011-01-18 NOTE — Telephone Encounter (Signed)
Call placed to [facility] on Rx clarification  On Magic Mouth Wash. 30 ml Benadryl, 60 ml of Mylanta and 4 gm of Carafate provided to pharmacist per Dr Rodena Medin instruction.

## 2011-01-18 NOTE — Assessment & Plan Note (Signed)
Currently asx. Magic mouthwash provided for future sores/ulcers.

## 2011-01-19 ENCOUNTER — Other Ambulatory Visit: Payer: Self-pay | Admitting: Internal Medicine

## 2011-01-19 DIAGNOSIS — B182 Chronic viral hepatitis C: Secondary | ICD-10-CM

## 2011-03-03 LAB — CBC
Platelets: 88 — ABNORMAL LOW
RBC: 4.98
WBC: 4.3

## 2011-03-03 LAB — PROTIME-INR
INR: 0.9
Prothrombin Time: 12.7

## 2011-05-16 ENCOUNTER — Encounter: Payer: Self-pay | Admitting: Internal Medicine

## 2011-05-16 ENCOUNTER — Ambulatory Visit (INDEPENDENT_AMBULATORY_CARE_PROVIDER_SITE_OTHER): Payer: BC Managed Care – PPO | Admitting: Internal Medicine

## 2011-05-16 DIAGNOSIS — B1920 Unspecified viral hepatitis C without hepatic coma: Secondary | ICD-10-CM

## 2011-05-16 DIAGNOSIS — D696 Thrombocytopenia, unspecified: Secondary | ICD-10-CM

## 2011-05-16 DIAGNOSIS — B182 Chronic viral hepatitis C: Secondary | ICD-10-CM

## 2011-05-16 DIAGNOSIS — Z13220 Encounter for screening for lipoid disorders: Secondary | ICD-10-CM

## 2011-05-16 DIAGNOSIS — Z23 Encounter for immunization: Secondary | ICD-10-CM

## 2011-05-16 DIAGNOSIS — Z2911 Encounter for prophylactic immunotherapy for respiratory syncytial virus (RSV): Secondary | ICD-10-CM

## 2011-05-16 NOTE — Progress Notes (Signed)
  Subjective:    Patient ID: Shaun [name], male    DOB: [birth year], 60 y.o.   MRN: [id number]  HPI Pt presents to clinic for followup of multiple medical problems. No recent mouth ulcers. Uses magic mouthwash for mild ulcers and viscous lidocaine for more severe. H/o hep c without abd pain. No active complaints.  Past Medical History  Diagnosis Date  . Hepatitis c, chronic   . History of intravenous drug use in remission    No past surgical history on file.  reports that he has never smoked. He has never used smokeless tobacco. He reports that he does not drink alcohol. His drug history not on file. family history includes Heart disease in his father.  There is no history of Cancer. No Known Allergies   Review of Systems see hpi     Objective:   Physical Exam  Nursing note and vitals reviewed. Constitutional: He appears well-developed and well-nourished. No distress.  HENT:  Head: Normocephalic and atraumatic.  Right Ear: External ear normal.  Left Ear: External ear normal.  Eyes: Conjunctivae are normal. No scleral icterus.  Neck: Neck supple.  Cardiovascular: Normal rate, regular rhythm and normal heart sounds.  Exam reveals no gallop and no friction rub.   No murmur heard. Pulmonary/Chest: Effort normal and breath sounds normal. No respiratory distress. He has no wheezes.  Neurological: He is alert.  Skin: Skin is warm and dry. He is not diaphoretic.  Psychiatric: He has a normal mood and affect.          Assessment & Plan:

## 2011-05-17 LAB — CBC
Hemoglobin: 15.5 g/dL (ref 13.0–17.0)
Platelets: 131 10*3/uL — ABNORMAL LOW (ref 150–400)
RBC: 4.87 MIL/uL (ref 4.22–5.81)
WBC: 3 10*3/uL — ABNORMAL LOW (ref 4.0–10.5)

## 2011-05-17 LAB — BASIC METABOLIC PANEL
CO2: 30 mEq/L (ref 19–32)
Calcium: 9.4 mg/dL (ref 8.4–10.5)
Chloride: 101 mEq/L (ref 96–112)
Glucose, Bld: 103 mg/dL — ABNORMAL HIGH (ref 70–99)
Potassium: 4.2 mEq/L (ref 3.5–5.3)
Sodium: 141 mEq/L (ref 135–145)

## 2011-05-17 LAB — HEPATIC FUNCTION PANEL
AST: 44 U/L — ABNORMAL HIGH (ref 0–37)
Albumin: 4.7 g/dL (ref 3.5–5.2)
Alkaline Phosphatase: 52 U/L (ref 39–117)
Total Protein: 6.9 g/dL (ref 6.0–8.3)

## 2011-05-17 LAB — LIPID PANEL: HDL: 50 mg/dL (ref >39)

## 2011-05-17 NOTE — Assessment & Plan Note (Signed)
Obtain cbc, chem7 and lft

## 2011-07-07 ENCOUNTER — Ambulatory Visit (INDEPENDENT_AMBULATORY_CARE_PROVIDER_SITE_OTHER): Payer: BC Managed Care – PPO | Admitting: Internal Medicine

## 2011-07-07 ENCOUNTER — Encounter: Payer: Self-pay | Admitting: Internal Medicine

## 2011-07-07 VITALS — BP 116/60 | HR 68 | Temp 98.3°F | Resp 18 | Wt 173.0 lb

## 2011-07-07 DIAGNOSIS — M549 Dorsalgia, unspecified: Secondary | ICD-10-CM

## 2011-07-07 MED ORDER — FLUOCINONIDE 0.05 % EX GEL: Freq: Two times a day (BID) | CUTANEOUS | Status: DC

## 2011-07-07 MED ORDER — CYCLOBENZAPRINE HCL 5 MG PO TABS: 5.0000 mg | ORAL_TABLET | Freq: Three times a day (TID) | ORAL | Status: DC | PRN

## 2011-07-08 NOTE — Assessment & Plan Note (Signed)
Attempt flexeril prn. Cautioned re possible sedating effect. Followup if no improvement or worsening.  

## 2011-07-08 NOTE — Progress Notes (Signed)
  Subjective:    Patient ID: Shaun [name], male    DOB: 1950-08-19, 60 y.o.   MRN: [id number]  HPI Pt presents to clinic for evaluation of back pain. Notes 1+wk h/o left medial scapular pain without injury/trauma. No radiating pain. Pain worse with lifting or certain arm movements. Taking ibuprofen otc without adverse effect. Medication helps. No other alleviating or exacerbating factors. No other complaints.   Past Medical History  Diagnosis Date  . Hepatitis c, chronic   . History of intravenous drug use in remission    No past surgical history on file.  reports that he has never smoked. He has never used smokeless tobacco. He reports that he does not drink alcohol. His drug history not on file. family history includes Heart disease in his father.  There is no history of Cancer. No Known Allergies   Review of Systems see hpi     Objective:   Physical Exam  Nursing note and vitals reviewed. Constitutional: He appears well-developed and well-nourished.  HENT:  Head: Normocephalic and atraumatic.  Musculoskeletal:       FROM left arm/shoulder. Left scapula NT without bony abn. Mild tenderness to palpation along lower rhomboid muscles.   Neurological: He is alert.  Skin: Skin is warm and dry.  Psychiatric: He has a normal mood and affect.          Assessment & Plan:

## 2011-10-20 ENCOUNTER — Telehealth: Payer: Self-pay | Admitting: Internal Medicine

## 2011-10-20 MED ORDER — MAGIC MOUTHWASH W/LIDOCAINE: 5.0000 mL | Freq: Three times a day (TID) | ORAL | Status: DC | PRN

## 2011-10-20 NOTE — Telephone Encounter (Signed)
Refill- benad 30ml mylant 60ml cara F4. Swish in mouth and swallow 33ml(1 teasp) 3 times daily as needed. Qty 90 last fill 3.21.13

## 2011-10-20 NOTE — Telephone Encounter (Signed)
ok 

## 2011-10-20 NOTE — Telephone Encounter (Signed)
Call placed to  [facility] at [phone], verbal refill provided to pharmacist.

## 2011-11-14 ENCOUNTER — Encounter: Payer: Self-pay | Admitting: Internal Medicine

## 2011-11-14 ENCOUNTER — Ambulatory Visit (INDEPENDENT_AMBULATORY_CARE_PROVIDER_SITE_OTHER): Payer: BC Managed Care – PPO | Admitting: Internal Medicine

## 2011-11-14 VITALS — BP 122/80 | HR 67 | Temp 98.2°F | Resp 18 | Ht 72.0 in | Wt 167.0 lb

## 2011-11-14 DIAGNOSIS — B1920 Unspecified viral hepatitis C without hepatic coma: Secondary | ICD-10-CM

## 2011-11-14 DIAGNOSIS — R739 Hyperglycemia, unspecified: Secondary | ICD-10-CM

## 2011-11-14 DIAGNOSIS — D696 Thrombocytopenia, unspecified: Secondary | ICD-10-CM

## 2011-11-14 DIAGNOSIS — R21 Rash and other nonspecific skin eruption: Secondary | ICD-10-CM

## 2011-11-14 DIAGNOSIS — B182 Chronic viral hepatitis C: Secondary | ICD-10-CM

## 2011-11-14 DIAGNOSIS — R7309 Other abnormal glucose: Secondary | ICD-10-CM

## 2011-11-14 LAB — CBC WITH DIFFERENTIAL/PLATELET
Eosinophils Relative: 1 % (ref 0–5)
HCT: 41.7 % (ref 39.0–52.0)
Lymphocytes Relative: 41 % (ref 12–46)
Lymphs Abs: 1.5 10*3/uL (ref 0.7–4.0)
MCV: 86.9 fL (ref 78.0–100.0)
Monocytes Absolute: 0.3 10*3/uL (ref 0.1–1.0)
RBC: 4.8 MIL/uL (ref 4.22–5.81)
WBC: 3.7 10*3/uL — ABNORMAL LOW (ref 4.0–10.5)

## 2011-11-14 LAB — BASIC METABOLIC PANEL WITH GFR
BUN: 16 mg/dL (ref 6–23)
CO2: 27 meq/L (ref 19–32)
Calcium: 9.2 mg/dL (ref 8.4–10.5)
Chloride: 101 meq/L (ref 96–112)
Creat: 1.2 mg/dL (ref 0.50–1.35)
Glucose, Bld: 105 mg/dL — ABNORMAL HIGH (ref 70–99)
Potassium: 4.4 meq/L (ref 3.5–5.3)
Sodium: 139 meq/L (ref 135–145)

## 2011-11-14 LAB — HEPATIC FUNCTION PANEL
ALT: 54 U/L — ABNORMAL HIGH (ref 0–53)
AST: 49 U/L — ABNORMAL HIGH (ref 0–37)
Albumin: 4.6 g/dL (ref 3.5–5.2)
Alkaline Phosphatase: 47 U/L (ref 39–117)
Bilirubin, Direct: 0.2 mg/dL (ref 0.0–0.3)
Indirect Bilirubin: 0.7 mg/dL (ref 0.0–0.9)
Total Bilirubin: 0.9 mg/dL (ref 0.3–1.2)
Total Protein: 7 g/dL (ref 6.0–8.3)

## 2011-11-14 NOTE — Progress Notes (Signed)
  Subjective:    Patient ID: Shaun [name], male    DOB: [birth year], 61 y.o.   MRN: [id number]  HPI Pt presents to clinic for followup of multiple medical problems. H/o chronic hepatitis c. States no longer followed by GI-seen last year. Has thrombocytopenia without gross active bleeding. States h/o chronic intermittent rash described as papules. Lidex helps but returns. No obvious trigger.   Past Medical History  Diagnosis Date  . Hepatitis C, chronic   . History of intravenous drug use in remission    No past surgical history on file.  reports that he has never smoked. He has never used smokeless tobacco. He reports that he does not drink alcohol. His drug history not on file. family history includes Heart disease in his father.  There is no history of Cancer. No Known Allergies    Review of Systems see hpi     Objective:   Physical Exam  Nursing note and vitals reviewed. Constitutional: He appears well-developed and well-nourished. No distress.  HENT:  Head: Normocephalic and atraumatic.  Eyes: Conjunctivae are normal. No scleral icterus.  Cardiovascular: Normal rate, regular rhythm and normal heart sounds.  Exam reveals no gallop and no friction rub.   No murmur heard. Pulmonary/Chest: Effort normal and breath sounds normal. No respiratory distress. He has no wheezes. He has no rales.  Abdominal: Soft. Bowel sounds are normal. He exhibits no distension and no mass. There is no tenderness. There is no rebound and no guarding.  Neurological: He is alert.  Skin: Skin is warm and dry. No rash noted. He is not diaphoretic.  Psychiatric: He has a normal mood and affect.          Assessment & Plan:

## 2011-11-15 DIAGNOSIS — R21 Rash and other nonspecific skin eruption: Secondary | ICD-10-CM | POA: Insufficient documentation

## 2011-11-15 LAB — AFP TUMOR MARKER: AFP-Tumor Marker: 4.4 ng/mL (ref 0.0–8.0)

## 2011-11-15 NOTE — Assessment & Plan Note (Signed)
Continue lidex prn. Consider derm consult if rash persists

## 2011-11-15 NOTE — Assessment & Plan Note (Signed)
Obtain cbc, lft. Check AFP

## 2012-02-09 ENCOUNTER — Telehealth: Payer: Self-pay | Admitting: *Deleted

## 2012-02-09 NOTE — Telephone Encounter (Signed)
Patient request to have lab results faxed to [facility]; came in office and signed release form, done/[facility]

## 2012-05-14 ENCOUNTER — Ambulatory Visit (INDEPENDENT_AMBULATORY_CARE_PROVIDER_SITE_OTHER): Payer: BC Managed Care – PPO | Admitting: Internal Medicine

## 2012-05-14 ENCOUNTER — Other Ambulatory Visit (HOSPITAL_BASED_OUTPATIENT_CLINIC_OR_DEPARTMENT_OTHER): Payer: BC Managed Care – PPO

## 2012-05-14 ENCOUNTER — Encounter: Payer: Self-pay | Admitting: Internal Medicine

## 2012-05-14 VITALS — BP 126/72 | HR 65 | Temp 98.1°F | Resp 14 | Ht 71.0 in | Wt 170.2 lb

## 2012-05-14 DIAGNOSIS — Z23 Encounter for immunization: Secondary | ICD-10-CM

## 2012-05-14 DIAGNOSIS — R7309 Other abnormal glucose: Secondary | ICD-10-CM

## 2012-05-14 DIAGNOSIS — B1920 Unspecified viral hepatitis C without hepatic coma: Secondary | ICD-10-CM

## 2012-05-14 DIAGNOSIS — R739 Hyperglycemia, unspecified: Secondary | ICD-10-CM | POA: Insufficient documentation

## 2012-05-14 DIAGNOSIS — D696 Thrombocytopenia, unspecified: Secondary | ICD-10-CM

## 2012-05-14 DIAGNOSIS — B182 Chronic viral hepatitis C: Secondary | ICD-10-CM

## 2012-05-14 NOTE — Assessment & Plan Note (Signed)
Obtain Chem-7 and A1c

## 2012-05-14 NOTE — Progress Notes (Signed)
  Subjective:    Patient ID: Shaun [name], male    DOB: January 01, 1951, 61 y.o.   MRN: [id number]  HPI Pt presents to clinic for followup of multiple medical problems. Known history of hepatitis C previously evaluated by gastroenterology and underwent liver biopsy. Has not followed back up with GI. Has had negative AFP within the past year. No known hepatic imaging since 2011. Denies abdominal pain, increase in abdominal girth fever or chills. Also has history of hyperglycemia without formal diagnosis of diabetes.  Past Medical History  Diagnosis Date  . Hepatitis C, chronic   . History of intravenous drug use in remission    No past surgical history on file.  reports that he has never smoked. He has never used smokeless tobacco. He reports that he does not drink alcohol. His drug history not on file. family history includes Heart disease in his father.  There is no history of Cancer. No Known Allergies    Review of Systems see hpi     Objective:   Physical Exam  Nursing note and vitals reviewed. Constitutional: He appears well-developed and well-nourished. No distress.  HENT:  Head: Normocephalic and atraumatic.  Right Ear: External ear normal.  Left Ear: External ear normal.  Eyes: Conjunctivae normal are normal. No scleral icterus.  Neck: Neck supple.  Cardiovascular: Normal rate, regular rhythm and normal heart sounds.  Exam reveals no gallop and no friction rub.   No murmur heard. Pulmonary/Chest: Effort normal and breath sounds normal. No respiratory distress. He has no wheezes. He has no rales.  Abdominal: Soft. Normal appearance and bowel sounds are normal. He exhibits no shifting dullness, no ascites and no mass. There is no hepatosplenomegaly. There is no tenderness.  Neurological: He is alert.  Skin: He is not diaphoretic.  Psychiatric: He has a normal mood and affect.          Assessment & Plan:

## 2012-05-14 NOTE — Assessment & Plan Note (Signed)
Obtain CBC, liver function tests and abdominal ultrasound. AFP negative within the past 6 months.

## 2012-05-15 MED ORDER — FLUOCINONIDE 0.05 % EX GEL: Freq: Two times a day (BID) | CUTANEOUS | Status: DC

## 2012-05-15 MED ORDER — MAGIC MOUTHWASH W/LIDOCAINE: 5.0000 mL | Freq: Three times a day (TID) | ORAL | Status: DC | PRN

## 2012-05-15 NOTE — Addendum Note (Signed)
Addended by: Regis Bill on: 05/15/2012 11:59 AM   Modules accepted: Orders

## 2012-05-15 NOTE — Addendum Note (Signed)
Addended by: Regis Bill on: 05/15/2012 01:45 PM   Modules accepted: Orders

## 2012-05-17 ENCOUNTER — Other Ambulatory Visit: Payer: Self-pay | Admitting: *Deleted

## 2012-05-17 MED ORDER — MAGIC MOUTHWASH W/LIDOCAINE: ORAL | Status: DC

## 2012-05-20 ENCOUNTER — Ambulatory Visit (HOSPITAL_BASED_OUTPATIENT_CLINIC_OR_DEPARTMENT_OTHER)
Admission: RE | Admit: 2012-05-20 | Discharge: 2012-05-20 | Disposition: A | Discharge status: Home / Self Care | Payer: BC Managed Care – PPO | Source: Ambulatory Visit | Attending: Internal Medicine | Admitting: Internal Medicine

## 2012-05-20 ENCOUNTER — Telehealth: Payer: Self-pay | Admitting: *Deleted

## 2012-05-20 DIAGNOSIS — B1920 Unspecified viral hepatitis C without hepatic coma: Secondary | ICD-10-CM | POA: Insufficient documentation

## 2012-05-20 DIAGNOSIS — D1809 Hemangioma of other sites: Secondary | ICD-10-CM | POA: Insufficient documentation

## 2012-05-20 NOTE — Telephone Encounter (Signed)
Left a detailed voice message that there was no mass seen on abdominal ultra sound.

## 2012-05-22 ENCOUNTER — Ambulatory Visit (INDEPENDENT_AMBULATORY_CARE_PROVIDER_SITE_OTHER): Payer: BC Managed Care – PPO | Admitting: Family

## 2012-05-22 ENCOUNTER — Encounter: Payer: Self-pay | Admitting: Family

## 2012-05-22 VITALS — BP 130/72 | HR 84 | Temp 98.3°F | Resp 16 | Ht 71.0 in | Wt 172.1 lb

## 2012-05-22 DIAGNOSIS — R599 Enlarged lymph nodes, unspecified: Secondary | ICD-10-CM

## 2012-05-22 DIAGNOSIS — R591 Generalized enlarged lymph nodes: Secondary | ICD-10-CM

## 2012-05-22 NOTE — Assessment & Plan Note (Signed)
I suspect that this is related to recent flu shot. Will have pt return in 2 weeks for recheck.  If symptoms worsen, or if no improvement at that time, consider additional work up for LAD.

## 2012-05-22 NOTE — Patient Instructions (Addendum)
Please call if you develop fever, increased pain/swelling. Follow up in 2 weeks so we can re-evaluate you.

## 2012-05-22 NOTE — Progress Notes (Signed)
  Subjective:    Patient ID: Shaun [name], male    DOB: May 20, 1951, 61 y.o.   MRN: [id number]  HPI  Mr. Brodowski is a 61 yr old male who presents today with chief complaint of "knot" on his left arm. He first noticed 1 week ago.  Knot is mildly tender.  He had a flu shot on 12/3.  Denies fever, cough or cold symptoms.   Review of Systems See HPI  Past Medical History  Diagnosis Date  . Hepatitis C, chronic   . History of intravenous drug use in remission     History   Social History  . Marital Status: Married    Spouse Name: N/A    Number of Children: N/A  . Years of Education: N/A   Occupational History  . Not on file.   Social History Main Topics  . Smoking status: Never Smoker   . Smokeless tobacco: Never Used  . Alcohol Use: No  . Drug Use: Not on file  . Sexually Active: Not on file   Other Topics Concern  . Not on file   Social History Narrative  . No narrative on file    No past surgical history on file.  Family History  Problem Relation Age of Onset  . Heart disease Father   . Cancer Neg Hx     negative for colon and prostate    No Known Allergies  Current Outpatient Prescriptions on File Prior to Visit  Medication Sig Dispense Refill  . Alum & Mag Hydroxide-Simeth (MAGIC MOUTHWASH W/LIDOCAINE) SOLN SWISH & SPIT MOUTHWASH [3] THREE TIMES DAILY AS NEEDED.  480 mL  0  . cyclobenzaprine (FLEXERIL) 5 MG tablet Take 1 tablet (5 mg total) by mouth 3 (three) times daily as needed for muscle spasms.  30 tablet  0  . fluocinonide gel (LIDEX) 0.05 % Apply topically 2 (two) times daily.  30 g  1  . glucosamine-chondroitin 500-400 MG tablet Take 1 tablet by mouth daily.      Marland Kitchen ibuprofen (ADVIL,MOTRIN) 200 MG tablet Take 200 mg by mouth as needed.      . methadone (DOLOPHINE) 10 MG/5ML solution Take 14mg  by mouth once daily.       . Multiple Vitamin (MULTIVITAMIN) tablet Take 1 tablet by mouth daily.        BP 130/72  Pulse 84  Temp 98.3 F (36.8 C)  (Oral)  Resp 16  Ht 5\' 11"  (1.803 m)  Wt 172 lb 1.3 oz (78.055 kg)  BMI 24.00 kg/m2  SpO2 98%       Objective:   Physical Exam  Constitutional: He appears well-developed and well-nourished. No distress.  Cardiovascular: Normal rate and regular rhythm.   No murmur heard. Pulmonary/Chest: Effort normal and breath sounds normal. No respiratory distress. He has no wheezes. He has no rales. He exhibits no tenderness.  Lymphadenopathy:       Approximately 1 cm diameter firm mobile mass left upper inner arm most consistent with lymph node.  A similar sizes palpable lymph node in left axilla.   Skin: Skin is warm and dry.          Assessment & Plan:

## 2012-05-28 ENCOUNTER — Telehealth: Payer: Self-pay | Admitting: *Deleted

## 2012-05-28 MED ORDER — CEPHALEXIN 500 MG PO CAPS: 500.0000 mg | ORAL_CAPSULE | Freq: Four times a day (QID) | ORAL | Status: DC

## 2012-05-28 NOTE — Telephone Encounter (Signed)
Per verbal from Provider, ok to send Keflex 500mg  1 four times daily for 7 days, #28 x no refills sent to [facility]. Advised pt to keep appt for tomorrow. Pt voices understanding.

## 2012-05-28 NOTE — Telephone Encounter (Signed)
Pt called stating swollen lymph node on his arm has increased in size and is slightly reddish in appearance. Also notes that he has another gland that is swollen in his left axilla, also appears reddish in color. Pt denies fever. Scheduled pt appt for tomorrow at 3:30.  Please advise if there are further directions?

## 2012-05-29 ENCOUNTER — Ambulatory Visit (INDEPENDENT_AMBULATORY_CARE_PROVIDER_SITE_OTHER): Payer: BC Managed Care – PPO | Admitting: Family

## 2012-05-29 ENCOUNTER — Encounter: Payer: Self-pay | Admitting: Family

## 2012-05-29 VITALS — BP 128/74 | HR 64 | Temp 98.4°F | Resp 16 | Wt 169.0 lb

## 2012-05-29 DIAGNOSIS — R591 Generalized enlarged lymph nodes: Secondary | ICD-10-CM

## 2012-05-29 DIAGNOSIS — R599 Enlarged lymph nodes, unspecified: Secondary | ICD-10-CM

## 2012-05-29 NOTE — Progress Notes (Signed)
Subjective:    Patient ID: Shaun [name], male    DOB: Apr 29, 1951, 61 y.o.   MRN: [id number]  HPI  Shaun [name] is a 61 yr old male with hx of hep C who presents today for follow up of his lymphadenopathy.  He was seen on 12/11 with mass left arm, felt to be slightly enlarged lymph node.  Yesterday, he noted that the lymph node on the left arm had become larger and slightly reddened.  He reports enlarged lymph node in left axilla "the size of a walnut."  He denies any associated fever.    Review of Systems    see HPI  Past Medical History  Diagnosis Date  . Hepatitis C, chronic   . History of intravenous drug use in remission     History   Social History  . Marital Status: Married    Spouse Name: N/A    Number of Children: N/A  . Years of Education: N/A   Occupational History  . Not on file.   Social History Main Topics  . Smoking status: Never Smoker   . Smokeless tobacco: Never Used  . Alcohol Use: No  . Drug Use: Not on file  . Sexually Active: Not on file   Other Topics Concern  . Not on file   Social History Narrative  . No narrative on file    No past surgical history on file.  Family History  Problem Relation Age of Onset  . Heart disease Father   . Cancer Neg Hx     negative for colon and prostate    No Known Allergies  Current Outpatient Prescriptions on File Prior to Visit  Medication Sig Dispense Refill  . Alum & Mag Hydroxide-Simeth (MAGIC MOUTHWASH W/LIDOCAINE) SOLN SWISH & SPIT MOUTHWASH [3] THREE TIMES DAILY AS NEEDED.  480 mL  0  . cephALEXin (KEFLEX) 500 MG capsule Take 1 capsule (500 mg total) by mouth 4 (four) times daily.  28 capsule  0  . cyclobenzaprine (FLEXERIL) 5 MG tablet Take 1 tablet (5 mg total) by mouth 3 (three) times daily as needed for muscle spasms.  30 tablet  0  . fluocinonide gel (LIDEX) 0.05 % Apply topically 2 (two) times daily.  30 g  1  . glucosamine-chondroitin 500-400 MG tablet Take 1 tablet by mouth daily.       Marland Kitchen ibuprofen (ADVIL,MOTRIN) 200 MG tablet Take 200 mg by mouth as needed.      . methadone (DOLOPHINE) 10 MG/5ML solution Take 14mg  by mouth once daily.       . Multiple Vitamin (MULTIVITAMIN) tablet Take 1 tablet by mouth daily.        BP 128/74  Pulse 64  Temp 98.4 F (36.9 C) (Oral)  Resp 16  Wt 169 lb (76.658 kg)  SpO2 97%    Objective:   Physical Exam  Constitutional: He appears well-developed and well-nourished. No distress.  Lymphadenopathy:       Head (right side): No occipital adenopathy present.       Head (left side): No occipital adenopathy present.    He has cervical adenopathy.    He has axillary adenopathy.       Right axillary: No pectoral and no lateral adenopathy present.       Right: No inguinal adenopathy present.       Left: No inguinal adenopathy present.       Lymph node again noted left upper/inner arm, now larger in size- about 1 inch  diameter.  Mild overlying erythema is noted, another smaller lymph node is below this lymph node.   Large 1 inch tender lymph node is noted in the left axilla.   Psychiatric: He has a normal mood and affect. His behavior is normal. Thought content normal.          Assessment & Plan:

## 2012-05-29 NOTE — Assessment & Plan Note (Addendum)
Deteriorated.  Reviewed case with Dr. Rodena Medin.  Most likely cause is infectious.  Viral vs bacterial. Will have pt complete keflex, follow up with Dr. Rodena Medin in 2-3 weeks- sooner if symptoms worsen.  If no improvement at that time, consider referral to surgery for biopsy.  He had neg HIV screen back in 2010, consider HIV re-screen next visit.

## 2012-05-29 NOTE — Patient Instructions (Addendum)
Please continue keflex. Follow up with Dr. Rodena Medin in 2-3 weeks. Call us if you develop fever, increased pain or redness.

## 2012-06-07 ENCOUNTER — Ambulatory Visit (INDEPENDENT_AMBULATORY_CARE_PROVIDER_SITE_OTHER): Payer: BC Managed Care – PPO | Admitting: Family

## 2012-06-07 ENCOUNTER — Encounter: Payer: Self-pay | Admitting: Family

## 2012-06-07 ENCOUNTER — Ambulatory Visit (HOSPITAL_BASED_OUTPATIENT_CLINIC_OR_DEPARTMENT_OTHER)
Admission: RE | Admit: 2012-06-07 | Discharge: 2012-06-07 | Disposition: A | Discharge status: Home / Self Care | Payer: BC Managed Care – PPO | Source: Ambulatory Visit | Attending: Family | Admitting: Family

## 2012-06-07 ENCOUNTER — Telehealth: Payer: Self-pay | Admitting: Family

## 2012-06-07 VITALS — BP 124/70 | HR 75 | Temp 98.5°F | Resp 16 | Wt 168.2 lb

## 2012-06-07 DIAGNOSIS — R599 Enlarged lymph nodes, unspecified: Secondary | ICD-10-CM

## 2012-06-07 DIAGNOSIS — R2230 Localized swelling, mass and lump, unspecified upper limb: Secondary | ICD-10-CM

## 2012-06-07 DIAGNOSIS — R229 Localized swelling, mass and lump, unspecified: Secondary | ICD-10-CM | POA: Insufficient documentation

## 2012-06-07 DIAGNOSIS — R591 Generalized enlarged lymph nodes: Secondary | ICD-10-CM

## 2012-06-07 MED ORDER — DOXYCYCLINE HYCLATE 100 MG PO TABS: 100.0000 mg | ORAL_TABLET | Freq: Two times a day (BID) | ORAL | Status: DC

## 2012-06-07 NOTE — Telephone Encounter (Signed)
Please call pt on work phone this afternoon and let him know that I reviewed the ultrasound. The area appears to be a solid mass- nothing to drain. I would like him to continue antibiotics as we discussed and I am going to refer him to see a surgeon to possibly perform a biopsy of the area.

## 2012-06-07 NOTE — Progress Notes (Signed)
  Subjective:    Patient ID: Shaun [name], male    DOB: [birth year], 61 y.o.   MRN: [id number]  HPI  Shaun [name] is a 61 yr old male who presents today for follow up. He was seen last week for lymphadenopathy and placed on keflex.  He has completed keflex, but notes that the area of concern on his left upper arm is now slightly worse. The area remains red and tender.  He denies any other associated symptoms such as fever or rash.   Review of Systems See HPI    Objective:   Physical Exam  Constitutional: He is oriented to person, place, and time. He appears well-developed and well-nourished. No distress.  Lymphadenopathy:       Golf ball sized firm mass noted on left upper inner arm.  Non-fluctuant.  Mild associated overlying erythema is noted.  Left axillary LAD is resolved.   Neurological: He is alert and oriented to person, place, and time.          Assessment & Plan:

## 2012-06-07 NOTE — Assessment & Plan Note (Signed)
Lymph node enlargement versus abscess.  Will perform [place] of area. Rx with doxy. If appears Lymph node- refer to surgeon for biopsy.  If appears abscess will plan rx with doxy and close follow up.

## 2012-06-07 NOTE — Telephone Encounter (Signed)
LMOM with contact name & number for return call RE: results & further provider instructions/SLS 

## 2012-06-07 NOTE — Patient Instructions (Addendum)
Please start doxycycline. Complete your ultrasound on the first floor.  Follow up with Dr. Rodena Medin in 1 week.

## 2012-06-10 NOTE — Telephone Encounter (Signed)
Patient returned phone call. Best # [phone]

## 2012-06-10 NOTE — Telephone Encounter (Signed)
Patient informed, understood & agreed, has already been contacted for OV w/Surgeon [01.06.14]; canceled upcoming follow-up w/PCP; pt will re-scheduled after visit with surgeon/SLS

## 2012-06-13 ENCOUNTER — Ambulatory Visit: Payer: BC Managed Care – PPO | Admitting: Internal Medicine

## 2012-06-17 ENCOUNTER — Encounter (INDEPENDENT_AMBULATORY_CARE_PROVIDER_SITE_OTHER): Payer: Self-pay | Admitting: General Surgery

## 2012-06-17 ENCOUNTER — Ambulatory Visit: Payer: BC Managed Care – PPO | Admitting: Family

## 2012-06-17 ENCOUNTER — Ambulatory Visit (INDEPENDENT_AMBULATORY_CARE_PROVIDER_SITE_OTHER): Payer: BC Managed Care – PPO | Admitting: General Surgery

## 2012-06-17 VITALS — BP 132/76 | HR 66 | Temp 98.6°F | Resp 18 | Ht 72.0 in | Wt 167.8 lb

## 2012-06-17 DIAGNOSIS — R2230 Localized swelling, mass and lump, unspecified upper limb: Secondary | ICD-10-CM

## 2012-06-17 DIAGNOSIS — R229 Localized swelling, mass and lump, unspecified: Secondary | ICD-10-CM

## 2012-06-17 NOTE — Progress Notes (Signed)
Patient ID: Shaun [name], male   DOB: 24-Oct-1950, 62 y.o.   MRN: [id number]  Chief Complaint  Patient presents with  . New Evaluation    hypochoic    HPI Shaun [name] is a 62 y.o. male.  Referred by Dr. Rodena Medin HPI 11 yom with history of hep c and substance abuse who presents with recent history of enlarging left upper arm mass that causes some discomfort. He has no other symptoms with this.  It has grown rapidly in last couple weeks.  He also noted a mass under his left axilla.  He comes in today to have this evaluated.  He describes no weight loss or change in appetite. Past Medical History  Diagnosis Date  . Hepatitis C, chronic   . History of intravenous drug use in remission     Past Surgical History  Procedure Date  . Knee arthroscopy 1996    ligment repair    Family History  Problem Relation Age of Onset  . Heart disease Father   . Cancer Neg Hx     negative for colon and prostate    Social History History  Substance Use Topics  . Smoking status: Never Smoker   . Smokeless tobacco: Never Used  . Alcohol Use: No    No Known Allergies  Current Outpatient Prescriptions  Medication Sig Dispense Refill  . Alum & Mag Hydroxide-Simeth (MAGIC MOUTHWASH W/LIDOCAINE) SOLN SWISH & SPIT MOUTHWASH [3] THREE TIMES DAILY AS NEEDED.  480 mL  0  . fluocinonide gel (LIDEX) 0.05 % Apply topically 2 (two) times daily.  30 g  1  . glucosamine-chondroitin 500-400 MG tablet Take 1 tablet by mouth daily.      Marland Kitchen ibuprofen (ADVIL,MOTRIN) 200 MG tablet Take 200 mg by mouth as needed.      . methadone (DOLOPHINE) 10 MG/5ML solution Take 14mg  by mouth once daily.       . Multiple Vitamin (MULTIVITAMIN) tablet Take 1 tablet by mouth daily.      . cyclobenzaprine (FLEXERIL) 5 MG tablet Take 1 tablet (5 mg total) by mouth 3 (three) times daily as needed for muscle spasms.  30 tablet  0  . doxycycline (VIBRA-TABS) 100 MG tablet Take 1 tablet (100 mg total) by mouth 2 (two) times daily.   20 tablet  0    Review of Systems Review of Systems  Constitutional: Negative for fever, chills and unexpected weight change.  HENT: Negative for hearing loss, congestion, sore throat, trouble swallowing and voice change.   Eyes: Negative for visual disturbance.  Respiratory: Negative for cough and wheezing.   Cardiovascular: Negative for chest pain, palpitations and leg swelling.  Gastrointestinal: Negative for nausea, vomiting, abdominal pain, diarrhea, constipation, blood in stool, abdominal distention, anal bleeding and rectal pain.  Genitourinary: Negative for hematuria and difficulty urinating.  Musculoskeletal: Negative for arthralgias.  Skin: Negative for rash and wound.  Neurological: Negative for seizures, syncope, weakness and headaches.  Hematological: Positive for adenopathy. Does not bruise/bleed easily.  Psychiatric/Behavioral: Negative for confusion.    Blood pressure 132/76, pulse 66, temperature 98.6 F (37 C), resp. rate 18, height 6' (1.829 m), weight 167 lb 12.8 oz (76.114 kg).  Physical Exam Physical Exam  Vitals reviewed. Constitutional: He appears well-developed and well-nourished.  Eyes: No scleral icterus.  Cardiovascular: Normal rate, regular rhythm and normal heart sounds.   Pulses:      Radial pulses are 3+ on the left side.  Pulmonary/Chest: Effort normal and breath sounds normal. He has  no wheezes. He has no rales.  Musculoskeletal:       Arms: Lymphadenopathy:    He has no cervical adenopathy.    He has axillary adenopathy.       Left axillary: Lateral adenopathy present.    Data Reviewed  U/s reviewed  Assessment    Left upper extremity mass    Plan    I am concerned that this a sarcoma by exam, u/s results, growth recently and possibly lymphadenopathy. Will obtain an mr of left upper extremity, ct chest and then plan biopsy       Shaun [name] 06/17/2012, 9:56 PM

## 2012-06-19 ENCOUNTER — Telehealth (INDEPENDENT_AMBULATORY_CARE_PROVIDER_SITE_OTHER): Payer: Self-pay | Admitting: General Surgery

## 2012-06-19 NOTE — Telephone Encounter (Signed)
Called and spoke with patient and made him aware we need his new insurance card before we can pre-cert his testing scheduled for Monday 06/24/12. He states they have still not sent this to him. He sent his check certified mail to them and they received it on 06/13/2012. I offered to delay his testing until we have the information. He does not want to delay his testing. He is aware that his insurance may deny the test later if we are unable to get a pre-cert. He expressed understanding and will get [place] the card as soon as he has it.

## 2012-06-21 ENCOUNTER — Other Ambulatory Visit (INDEPENDENT_AMBULATORY_CARE_PROVIDER_SITE_OTHER): Payer: Self-pay | Admitting: General Surgery

## 2012-06-21 ENCOUNTER — Telehealth (INDEPENDENT_AMBULATORY_CARE_PROVIDER_SITE_OTHER): Payer: Self-pay | Admitting: General Surgery

## 2012-06-21 ENCOUNTER — Telehealth (INDEPENDENT_AMBULATORY_CARE_PROVIDER_SITE_OTHER): Payer: Self-pay

## 2012-06-21 DIAGNOSIS — IMO0002 Reserved for concepts with insufficient information to code with codable children: Secondary | ICD-10-CM

## 2012-06-21 DIAGNOSIS — R2230 Localized swelling, mass and lump, unspecified upper limb: Secondary | ICD-10-CM

## 2012-06-21 DIAGNOSIS — R590 Localized enlarged lymph nodes: Secondary | ICD-10-CM

## 2012-06-21 NOTE — Telephone Encounter (Signed)
Per Lynden Ang at [facility] order in system for Left Humerus was incorrect and needed to be with/without...changed order per her req.Marland KitchenMarland KitchenElease Hashimoto is aware

## 2012-06-21 NOTE — Telephone Encounter (Signed)
Diannia Ruder asking if we would change the order on CT Chest to contrast only. I will make the change in epic.

## 2012-06-24 ENCOUNTER — Ambulatory Visit
Admission: RE | Admit: 2012-06-24 | Discharge: 2012-06-24 | Disposition: A | Discharge status: Home / Self Care | Payer: BC Managed Care – PPO | Source: Ambulatory Visit | Attending: General Surgery | Admitting: General Surgery

## 2012-06-24 ENCOUNTER — Other Ambulatory Visit: Payer: BC Managed Care – PPO

## 2012-06-24 DIAGNOSIS — IMO0002 Reserved for concepts with insufficient information to code with codable children: Secondary | ICD-10-CM

## 2012-06-24 DIAGNOSIS — R2230 Localized swelling, mass and lump, unspecified upper limb: Secondary | ICD-10-CM

## 2012-06-24 DIAGNOSIS — R590 Localized enlarged lymph nodes: Secondary | ICD-10-CM

## 2012-06-24 MED ORDER — GADOBENATE DIMEGLUMINE 529 MG/ML IV SOLN
15.0000 mL | Freq: Once | INTRAVENOUS | Status: AC | PRN
  Administered 2012-06-24: 15 mL via INTRAVENOUS

## 2012-06-24 MED ORDER — IOHEXOL 300 MG/ML  SOLN
75.0000 mL | Freq: Once | INTRAMUSCULAR | Status: AC | PRN
  Administered 2012-06-24: 75 mL via INTRAVENOUS

## 2012-06-26 ENCOUNTER — Telehealth (INDEPENDENT_AMBULATORY_CARE_PROVIDER_SITE_OTHER): Payer: Self-pay

## 2012-06-26 NOTE — Telephone Encounter (Signed)
Called pt to let him know that his scans were reviewed by Dr Dwain Sarna. The chest CT scan is normal and the MRI left humerous is advising that we schedule a bx. Dr Dwain Sarna wants me to schedule the pt for a u/s guided bx of lt arm. I will work on getting this scheduled at the hospital and call the pt back. I will need to make a f/u appt with Dr Dwain Sarna after u/s guided bx. The pt understands.

## 2012-06-27 ENCOUNTER — Telehealth (INDEPENDENT_AMBULATORY_CARE_PROVIDER_SITE_OTHER): Payer: Self-pay

## 2012-06-27 DIAGNOSIS — IMO0002 Reserved for concepts with insufficient information to code with codable children: Secondary | ICD-10-CM

## 2012-06-27 NOTE — Telephone Encounter (Signed)
Called to let scheduling know there is an order in epic that needs to be scheduled for u/s guided bx. They will look at it under review.

## 2012-06-28 ENCOUNTER — Encounter (INDEPENDENT_AMBULATORY_CARE_PROVIDER_SITE_OTHER): Payer: BC Managed Care – PPO | Admitting: General Surgery

## 2012-06-28 NOTE — Telephone Encounter (Signed)
LMOM w/Jennifer to call me about the status of an appt for the pt w/IR for u/s guided bx.

## 2012-07-02 ENCOUNTER — Encounter (HOSPITAL_COMMUNITY): Payer: Self-pay | Admitting: Pharmacy Technician

## 2012-07-02 ENCOUNTER — Other Ambulatory Visit (HOSPITAL_COMMUNITY): Payer: Self-pay | Admitting: Physician Assistant

## 2012-07-02 ENCOUNTER — Telehealth (INDEPENDENT_AMBULATORY_CARE_PROVIDER_SITE_OTHER): Payer: Self-pay

## 2012-07-02 DIAGNOSIS — IMO0002 Reserved for concepts with insufficient information to code with codable children: Secondary | ICD-10-CM

## 2012-07-02 NOTE — Telephone Encounter (Signed)
Called to check on the status of the u/s bx appt and the scheduler told me the order was in wrong. I advised her that I put this order in last week and I have been waiting for an appt. I tried to explain to [place] when I called the first time of scheduling this test but she wouldn't listen and told me this would be in review with IR. I called back after a few days to check on the appt and still no appt they told me I needed to speak with IR. I left a voicemail in IR and still no answer to the appt with no call back. I called this am to scheduling and they told me this doesn't go to IR they could of scheduled the first time I called so I am re doing the orders.

## 2012-07-03 NOTE — Telephone Encounter (Signed)
Called pt to make sure the hospital called him with his appt time for the bx scheduled for 1/23 at 2:00. The pt was aware of the appt info. I made him a f/u appt with Dr Dwain Sarna for next week to go over the results.

## 2012-07-04 ENCOUNTER — Encounter (HOSPITAL_COMMUNITY): Payer: Self-pay

## 2012-07-04 ENCOUNTER — Telehealth (INDEPENDENT_AMBULATORY_CARE_PROVIDER_SITE_OTHER): Payer: Self-pay

## 2012-07-04 ENCOUNTER — Ambulatory Visit (HOSPITAL_COMMUNITY)
Admission: RE | Admit: 2012-07-04 | Discharge: 2012-07-04 | Disposition: A | Discharge status: Home / Self Care | Payer: BC Managed Care – PPO | Source: Ambulatory Visit | Attending: General Surgery | Admitting: General Surgery

## 2012-07-04 DIAGNOSIS — L980 Pyogenic granuloma: Secondary | ICD-10-CM | POA: Insufficient documentation

## 2012-07-04 DIAGNOSIS — Z79899 Other long term (current) drug therapy: Secondary | ICD-10-CM | POA: Insufficient documentation

## 2012-07-04 DIAGNOSIS — IMO0002 Reserved for concepts with insufficient information to code with codable children: Secondary | ICD-10-CM

## 2012-07-04 DIAGNOSIS — R229 Localized swelling, mass and lump, unspecified: Secondary | ICD-10-CM | POA: Insufficient documentation

## 2012-07-04 DIAGNOSIS — B182 Chronic viral hepatitis C: Secondary | ICD-10-CM | POA: Insufficient documentation

## 2012-07-04 LAB — APTT: aPTT: 26 seconds (ref 24–37)

## 2012-07-04 LAB — CBC
HCT: 42.8 % (ref 39.0–52.0)
Hemoglobin: 15.2 g/dL (ref 13.0–17.0)
MCHC: 35.5 g/dL (ref 30.0–36.0)
MCV: 87.7 fL (ref 78.0–100.0)
RDW: 12.2 % (ref 11.5–15.5)

## 2012-07-04 LAB — PROTIME-INR: INR: 1.05 (ref 0.00–1.49)

## 2012-07-04 MED ORDER — DIAZEPAM 5 MG PO TABS
ORAL_TABLET | ORAL | Status: AC
  Administered 2012-07-04: 5 mg via ORAL
  Filled 2012-07-04: qty 1

## 2012-07-04 MED ORDER — SODIUM CHLORIDE 0.9 % IV SOLN: Freq: Once | INTRAVENOUS | Status: DC

## 2012-07-04 MED ORDER — DIAZEPAM 5 MG PO TABS
5.0000 mg | ORAL_TABLET | Freq: Once | ORAL | Status: AC
  Administered 2012-07-04: 5 mg via ORAL

## 2012-07-04 NOTE — Telephone Encounter (Signed)
Called pt to notify him that his platelet count is really low and that Dr Dwain Sarna wants him seen by his PCP tomorrow. I called DR Hodgin's office with [facility] in [facility] they advised me that Dr Rodena Medin is out on medical leave but the nurse practioner could see the pt tomorrow at 3:00. The pt understands to go to the appt tomorrow and to f/u with Dr Dwain Sarna as planned next week.

## 2012-07-04 NOTE — Procedures (Signed)
Interventional Radiology Procedure Note  Procedure: Biopsy and culture of left upper arm nodule. Complications: None Recommendations: - Purulent drainage favor bartonella or atypical TB vs necrotic node - Path and micro pending  Signed,  Sterling Big, MD Vascular & Interventional Radiologist [facility]iology

## 2012-07-04 NOTE — H&P (Signed)
Agree with PA note.  Of note, pt suddenly recalled he has been bitten and scratched by a cat in the recent past.  He says he was asked before about this and he didn't think of it at the time.  Could this be Bartonella?  Signed,  Sterling Big, MD Vascular & Interventional Radiologist [facility]

## 2012-07-04 NOTE — H&P (Signed)
Chief Complaint: "I'm here for a biopsy" Referring Physician:[place] HPI: Shaun [name] is an 62 y.o. male who has an enlarging mass on his left upper arm. He has had imaging that is concerning for malignancy and is referred for biopsy. PMHx and meds reviewed.  Past Medical History:  Past Medical History  Diagnosis Date  . Hepatitis C, chronic   . History of intravenous drug use in remission     Past Surgical History:  Past Surgical History  Procedure Date  . Knee arthroscopy 1996    ligment repair    Family History:  Family History  Problem Relation Age of Onset  . Heart disease Father   . Cancer Neg Hx     negative for colon and prostate    Social History:  reports that he has never smoked. He has never used smokeless tobacco. He reports that he does not drink alcohol. His drug history not on file.  Allergies: No Known Allergies  Medications: Alum & Mag Hydroxide-Simeth (MAGIC MOUTHWASH W/LIDOCAINE) SOLN (Taking) Sig - Route: Take 10 mLs by mouth 3 (three) times daily as needed. Swish and spit. - Oral Class: Historical Med fluocinonide gel (LIDEX) 0.05 % (Taking) Sig - Route: Apply 1 application topically 2 (two) times daily. - Topical Class: Historical Med glucosamine-chondroitin 500-400 MG tablet (Taking) Sig - Route: Take 1 tablet by mouth daily. - Oral Class: Historical Med Number of times this order has been changed since signing: 1 Order [place] ibuprofen (ADVIL,MOTRIN) 200 MG tablet (Taking) Sig - Route: Take 200 mg by mouth 3 (three) times daily as needed. For pain. - Oral Class: Historical Med Number of times this order has been changed since signing: 2 Order [place] methadone (DOLOPHINE) 10 MG/5ML solution (Taking) Sig - Route: Take 12 mg by mouth daily. - Oral Class: Historical Med Number of times this order has been changed since signing: 2 Order [place] Multiple Vitamin (MULTIVITAMIN) tablet (Taking) Sig - Route: Take 1 tablet by mouth daily. - Oral  Class: Historical Med   Please HPI for pertinent positives, otherwise complete 10 system ROS negative.  Physical Exam: Blood pressure 124/80, pulse 60, temperature 97.8 F (36.6 C), temperature source Oral, resp. rate 18, height 6' (1.829 m), weight 160 lb (72.576 kg), SpO2 98.00%. Body mass index is 21.70 kg/(m^2).   General Appearance:  Alert, cooperative, no distress, appears stated age  Head:  Normocephalic, without obvious abnormality, atraumatic  ENT: Unremarkable  Neck: Supple, symmetrical, trachea midline, no adenopathy, thyroid: not enlarged, symmetric, no tenderness/mass/nodules  Lungs:   Clear to auscultation bilaterally, no w/r/r, respirations unlabored without use of accessory muscles.  Heart:  Regular rate and rhythm, S1, S2 normal, no murmur, rub or gallop. Carotids 2+ without bruit.  Extremities: (L)medial humeral area with palpable nontender mass. Soft and no overlying skin changes or erythema.  Pulses: 2+ and symmetric  Neurologic: Normal affect, no gross deficits.   No results found for this or any previous visit (from the past 48 hour(s)). No results found.  Assessment/Plan (L)medial upper arm mass Hep C Plan for US guided biopsy. Originally planned for sedation but feel pt would do fine with po Valium and local anesthesia only, he is agreeable to that. Discussed risks. Labs pending Consent signed in chart  Brayton El PA-C 07/04/2012, 1:26 PM

## 2012-07-05 ENCOUNTER — Encounter (HOSPITAL_COMMUNITY): Payer: Self-pay | Admitting: Emergency Medicine

## 2012-07-05 ENCOUNTER — Ambulatory Visit: Payer: BC Managed Care – PPO | Admitting: Family

## 2012-07-05 ENCOUNTER — Emergency Department (HOSPITAL_COMMUNITY)
Admission: EM | Admit: 2012-07-05 | Discharge: 2012-07-05 | Disposition: A | Discharge status: Home / Self Care | Payer: BC Managed Care – PPO | Attending: Emergency Medicine | Admitting: Emergency Medicine

## 2012-07-05 ENCOUNTER — Telehealth: Payer: Self-pay | Admitting: Family

## 2012-07-05 DIAGNOSIS — Z79899 Other long term (current) drug therapy: Secondary | ICD-10-CM | POA: Insufficient documentation

## 2012-07-05 DIAGNOSIS — F1910 Other psychoactive substance abuse, uncomplicated: Secondary | ICD-10-CM | POA: Insufficient documentation

## 2012-07-05 DIAGNOSIS — R591 Generalized enlarged lymph nodes: Secondary | ICD-10-CM

## 2012-07-05 DIAGNOSIS — Z8619 Personal history of other infectious and parasitic diseases: Secondary | ICD-10-CM | POA: Insufficient documentation

## 2012-07-05 DIAGNOSIS — R599 Enlarged lymph nodes, unspecified: Secondary | ICD-10-CM | POA: Insufficient documentation

## 2012-07-05 LAB — CBC WITH DIFFERENTIAL/PLATELET
Basophils Absolute: 0 10*3/uL (ref 0.0–0.1)
Basophils Relative: 1 % (ref 0–1)
Eosinophils Absolute: 0.1 10*3/uL (ref 0.0–0.7)
Eosinophils Relative: 2 % (ref 0–5)
Lymphs Abs: 1.1 10*3/uL (ref 0.7–4.0)
MCH: 30.6 pg (ref 26.0–34.0)
MCV: 89.2 fL (ref 78.0–100.0)
Neutrophils Relative %: 54 % (ref 43–77)
Platelets: 121 10*3/uL — ABNORMAL LOW (ref 150–400)
RBC: 4.83 MIL/uL (ref 4.22–5.81)
RDW: 12.1 % (ref 11.5–15.5)

## 2012-07-05 LAB — BASIC METABOLIC PANEL
Calcium: 9.4 mg/dL (ref 8.4–10.5)
GFR calc Af Amer: 83 mL/min — ABNORMAL LOW (ref >90)
GFR calc non Af Amer: 72 mL/min — ABNORMAL LOW (ref >90)
Glucose, Bld: 139 mg/dL — ABNORMAL HIGH (ref 70–99)
Potassium: 3.9 mEq/L (ref 3.5–5.1)
Sodium: 137 mEq/L (ref 135–145)

## 2012-07-05 NOTE — Telephone Encounter (Signed)
Please call pt and let him know that based on his platelet count he should be evaluated in the ER and not in the office.  Please advise him to go directly to the ER.

## 2012-07-05 NOTE — ED Notes (Signed)
RN to obtain labs with start of IV 

## 2012-07-05 NOTE — ED Provider Notes (Signed)
History     CSN: [id number]  Arrival date & time 07/05/12  1013   First MD Initiated Contact with Patient 07/05/12 1021      Chief Complaint  Patient presents with  . low platelet count     (Consider location/radiation/quality/duration/timing/severity/associated sxs/prior treatment) HPI.... recent biopsy on mass in proximal medial left upper extremity yesterday.  Blood work revealed a low platelet count. Patient was instructed to come to the emergency room department.  Otherwise he has no complaints. No bruising. No petechiae. No rectal bleeding. No other somatic complaints.  Past Medical History  Diagnosis Date  . Hepatitis C, chronic   . History of intravenous drug use in remission     Past Surgical History  Procedure Date  . Knee arthroscopy 1996    ligment repair    Family History  Problem Relation Age of Onset  . Heart disease Father   . Cancer Neg Hx     negative for colon and prostate    History  Substance Use Topics  . Smoking status: Never Smoker   . Smokeless tobacco: Never Used  . Alcohol Use: No      Review of Systems  All other systems reviewed and are negative.    Allergies  Review of patient's allergies indicates no known allergies.  Home Medications   Current Outpatient Rx  Name  Route  Sig  Dispense  Refill  . MAGIC MOUTHWASH W/LIDOCAINE   Oral   Take 10 mLs by mouth 3 (three) times daily as needed. Swish and spit.         Marland Kitchen FLUOCINONIDE 0.05 % EX GEL   Topical   Apply 1 application topically 2 (two) times daily as needed. Applies to tongue as needed         . GLUCOSAMINE-CHONDROITIN 500-400 MG PO TABS   Oral   Take 1 tablet by mouth every morning.          . IBUPROFEN 200 MG PO TABS   Oral   Take 200 mg by mouth 3 (three) times daily as needed. For pain.         Marland Kitchen METHADONE HCL 10 MG/5ML PO SOLN   Oral   Take 12 mg by mouth every morning.          Marland Kitchen ONE-DAILY MULTI VITAMINS PO TABS   Oral   Take 1 tablet by  mouth every morning.            BP 101/65  Pulse 73  Temp 98.4 F (36.9 C)  Resp 20  SpO2 100%  Physical Exam  Nursing note and vitals reviewed. Constitutional: He is oriented to person, place, and time. He appears well-developed and well-nourished.  HENT:  Head: Normocephalic and atraumatic.  Eyes: Conjunctivae normal and EOM are normal. Pupils are equal, round, and reactive to light.  Neck: Normal range of motion. Neck supple.  Cardiovascular: Normal rate, regular rhythm and normal heart sounds.   Pulmonary/Chest: Effort normal and breath sounds normal.  Abdominal: Soft. Bowel sounds are normal.  Musculoskeletal: Normal range of motion.  Neurological: He is alert and oriented to person, place, and time.  Skin: Skin is warm and dry.       No ecchymosis or petechiae.  Healing wound on left upper extremity  Psychiatric: He has a normal mood and affect.    ED Course  Procedures (including critical care time)  Labs Reviewed  CBC WITH DIFFERENTIAL - Abnormal; Notable for the following:  WBC 3.2 (*)     Platelets 121 (*)     All other components within normal limits  BASIC METABOLIC PANEL - Abnormal; Notable for the following:    Glucose, Bld 139 (*)     GFR calc non Af Amer 72 (*)     GFR calc Af Amer 83 (*)     All other components within normal limits   Results for orders placed during the hospital encounter of 07/05/12  CBC WITH DIFFERENTIAL      Component Value Range   WBC 3.2 (*) 4.0 - 10.5 K/uL   RBC 4.83  4.22 - 5.81 MIL/uL   Hemoglobin 14.8  13.0 - 17.0 g/dL   HCT 91.4  78.2 - 95.6 %   MCV 89.2  78.0 - 100.0 fL   MCH 30.6  26.0 - 34.0 pg   MCHC 34.3  30.0 - 36.0 g/dL   RDW 21.3  08.6 - 57.8 %   Platelets 121 (*) 150 - 400 K/uL   Neutrophils Relative 54  43 - 77 %   Neutro Abs 1.8  1.7 - 7.7 K/uL   Lymphocytes Relative 35  12 - 46 %   Lymphs Abs 1.1  0.7 - 4.0 K/uL   Monocytes Relative 8  3 - 12 %   Monocytes Absolute 0.3  0.1 - 1.0 K/uL    Eosinophils Relative 2  0 - 5 %   Eosinophils Absolute 0.1  0.0 - 0.7 K/uL   Basophils Relative 1  0 - 1 %   Basophils Absolute 0.0  0.0 - 0.1 K/uL  BASIC METABOLIC PANEL      Component Value Range   Sodium 137  135 - 145 mEq/L   Potassium 3.9  3.5 - 5.1 mEq/L   Chloride 100  96 - 112 mEq/L   CO2 27  19 - 32 mEq/L   Glucose, Bld 139 (*) 70 - 99 mg/dL   BUN 14  6 - 23 mg/dL   Creatinine, Ser 4.69  0.50 - 1.35 mg/dL   Calcium 9.4  8.4 - 62.9 mg/dL   GFR calc non Af Amer 72 (*) >90 mL/min   GFR calc Af Amer 83 (*) >90 mL/min   US Biopsy  07/04/2012  *RADIOLOGY REPORT*  ULTRASOUND CORE BIOPSY  Date: July 04, 2012  Clinical History: 62 year old male with a past medical history of IV drug use and hepatitis C.  Additionally, he has a 26-month history of nodular mass in the superficial soft tissues of the medial left upper arm.  During the consent process, he recalled that he was bit and scratched by a kitten prior to the development of this lump.  He states that he forgot to mention this on his previous Dr. visits.  He presents for ultrasound guided biopsy  Procedures Performed: 1. Ultrasound-guided core biopsy  Interventional Radiologist:  Sterling Big, MD  Sedation: Conscious sedation was not used  PROCEDURE/FINDINGS:   Informed consent was obtained from the patient following explanation of the procedure, risks, benefits and alternatives. The patient understands, agrees and consents for the procedure. All questions were addressed. A time out was performed.  Maximal barrier sterile technique utilized including caps, mask, sterile gowns, sterile gloves, large sterile drape, hand hygiene, and betadine skin prep.  The left upper extremity was interrogated with ultrasound.  An irregular hypoechoic mass was successfully identified.  Local anesthesia was achieved by infiltration 1% lidocaine.  A small dermatotomy was made in the skin with #11  blade.  Using a 18 gauge Biopince automated biopsy device,  several core biopsies were obtained.  After the second core biopsy I note is there was some purulent appearing liquid draining from the site.  Therefore, I swabbed and culture this and attempted to palpating express all of the residual fluid.  Additional core biopsies were then obtained for both culture and cytology.  The patient tolerated the procedure well, there is no immediate complication.  IMPRESSION:  Technically successful ultrasound guided core biopsy of superficial nodule in the medial left upper extremity. Of note, at least a portion of the mass consisted of complex fluid which was successfully expelled, and sent for culture.  Additionally, just today he recalled an event where he was bit and scratched by a kitten prior to the onset of this nodule.  I am suspicious for possible Bartonella (cat scratch lymphadenopathy) or potentially an atypical mycobacterial infection.  If this is a neoplastic process, it is highly necrotic.  Samples were sent for cytology as well as bacterial, fungal and mycobacterial culture.  Signed,  Sterling Big, MD Vascular & Interventional Radiologist [facility] Radiology   Original Report Authenticated By: Malachy Moan, M.D.      1. Lymphadenopathy       MDM  Platelet count today was 121      HgB 14.8       WBC  3.2       Suspect lab error for earlier reading        Donnetta Hutching, MD 07/05/12 1407

## 2012-07-05 NOTE — Telephone Encounter (Signed)
Notified pt at 8:45am and he voiced understanding. Called [facility] and gave update to charge nurse of pt's pending arrival.

## 2012-07-05 NOTE — ED Notes (Signed)
IV team responded  

## 2012-07-05 NOTE — ED Notes (Signed)
IV team paged.  

## 2012-07-05 NOTE — ED Notes (Addendum)
Patient states he had a biopsy done of lump on left upper arm yesterday and had labs as well- patient's PCP called today and said his platelet count was low and he needed to come to the ER to be evaluated.  Patient denies any symptoms at this time.

## 2012-07-07 LAB — CULTURE, ROUTINE-ABSCESS: Culture: NO GROWTH

## 2012-07-10 ENCOUNTER — Encounter (INDEPENDENT_AMBULATORY_CARE_PROVIDER_SITE_OTHER): Payer: Self-pay | Admitting: General Surgery

## 2012-07-10 ENCOUNTER — Ambulatory Visit (INDEPENDENT_AMBULATORY_CARE_PROVIDER_SITE_OTHER): Payer: BC Managed Care – PPO | Admitting: General Surgery

## 2012-07-10 VITALS — BP 160/72 | HR 60 | Temp 97.7°F | Resp 16 | Ht 72.0 in | Wt 172.0 lb

## 2012-07-10 DIAGNOSIS — A281 Cat-scratch disease: Secondary | ICD-10-CM

## 2012-07-10 MED ORDER — DOXYCYCLINE HYCLATE 100 MG PO TABS: 100.0000 mg | ORAL_TABLET | Freq: Two times a day (BID) | ORAL | Status: DC

## 2012-07-10 NOTE — Progress Notes (Signed)
Subjective:     Patient ID: Shaun [name], male   DOB: [birth year], 62 y.o.   MRN: [id number]  HPI  2 yom who I saw several weeks ago with a hard mass on the inner aspect of his left upper arm associated with a palpable node in his axilla.  At this time he denied any trauma or any contact with animals.  He stated he had no idea how this came up.  It grew rapidly.  I was concerned this might be a soft tissue tumor and sent him for imaging and biopsy.  He underwent mr that showed this might very well be node that was necrotic.  An attempt at biopsy then had purulence drain from this and since then it is about a quarter of its previous size.  He states it is much better and significantly less tender.  There is no redness.  He has no fevers.  He feels much better now.He also states that he now recalls that he was bitten and scratched by a kitten just prior to this.  Review of Systems     Objective:   Physical Exam 2x4 cm hard mass upper arm inner aspect over bicep no infection noted, no erythema, this is significantly smaller    Assessment:     Likely catscratch disease    Plan:     I think now this is drained he could be cured with antibiotics and reserving drainage for failure.  This would be operative now.  i will put on two week course of abx and see back at completion

## 2012-07-30 ENCOUNTER — Ambulatory Visit (INDEPENDENT_AMBULATORY_CARE_PROVIDER_SITE_OTHER): Payer: BC Managed Care – PPO | Admitting: General Surgery

## 2012-07-30 ENCOUNTER — Encounter (INDEPENDENT_AMBULATORY_CARE_PROVIDER_SITE_OTHER): Payer: Self-pay | Admitting: General Surgery

## 2012-07-30 VITALS — BP 132/78 | HR 76 | Temp 98.1°F | Resp 16 | Ht 72.0 in | Wt 169.0 lb

## 2012-07-30 DIAGNOSIS — I889 Nonspecific lymphadenitis, unspecified: Secondary | ICD-10-CM

## 2012-07-30 NOTE — Progress Notes (Signed)
Subjective:     Patient ID: Shaun [name], male   DOB: Apr 06, 1951, 62 y.o.   MRN: [id number]  HPI 25 yom who I saw previously with a hard mass on the inner aspect of his left upper arm associated with a palpable node in his axilla. At this time he denied any trauma or any contact with animals. He stated he had no idea how this came up. It grew rapidly. I was concerned this might be a soft tissue tumor and sent him for imaging and biopsy. He underwent mr that showed this might very well be node that was necrotic. An attempt at biopsy then had purulence drain from this and since then it is about a quarter of its previous size. He told me at last visit that he then did recall a cat scratching him at this site.  Since then I have had him on abx after drainage and this is nearly gone.  He has no complaints right now.  Review of Systems     Objective:   Physical Exam Left upper arm with minimal nodularity at prior site of what was an abscess    Assessment:     catscratch disease     Plan:     I think much of his testing would have been avoided if I had known about the history which he did not state initially.  This area is nearly gone now on exam.  I think reasonable to just monitor this for now and will see back if it does not disappear.  This clearly was infectious.

## 2012-07-30 NOTE — Patient Instructions (Signed)
I think this mass will completely go away but if still present in one month call me

## 2012-07-31 LAB — FUNGUS CULTURE W SMEAR

## 2012-08-16 LAB — AFB CULTURE WITH SMEAR (NOT AT ARMC): Acid Fast Smear: NONE SEEN

## 2012-11-11 ENCOUNTER — Ambulatory Visit: Payer: BC Managed Care – PPO | Admitting: Internal Medicine

## 2012-11-12 ENCOUNTER — Ambulatory Visit (INDEPENDENT_AMBULATORY_CARE_PROVIDER_SITE_OTHER): Payer: BC Managed Care – PPO | Admitting: Family Medicine

## 2012-11-12 ENCOUNTER — Encounter: Payer: Self-pay | Admitting: Family Medicine

## 2012-11-12 VITALS — BP 144/80 | HR 72 | Temp 97.9°F | Ht 72.0 in | Wt 168.0 lb

## 2012-11-12 DIAGNOSIS — IMO0001 Reserved for inherently not codable concepts without codable children: Secondary | ICD-10-CM

## 2012-11-12 DIAGNOSIS — K1370 Unspecified lesions of oral mucosa: Secondary | ICD-10-CM

## 2012-11-12 DIAGNOSIS — L989 Disorder of the skin and subcutaneous tissue, unspecified: Secondary | ICD-10-CM

## 2012-11-12 DIAGNOSIS — R030 Elevated blood-pressure reading, without diagnosis of hypertension: Secondary | ICD-10-CM

## 2012-11-12 DIAGNOSIS — R7309 Other abnormal glucose: Secondary | ICD-10-CM

## 2012-11-12 DIAGNOSIS — M25512 Pain in left shoulder: Secondary | ICD-10-CM

## 2012-11-12 DIAGNOSIS — D696 Thrombocytopenia, unspecified: Secondary | ICD-10-CM

## 2012-11-12 DIAGNOSIS — L578 Other skin changes due to chronic exposure to nonionizing radiation: Secondary | ICD-10-CM

## 2012-11-12 DIAGNOSIS — M25519 Pain in unspecified shoulder: Secondary | ICD-10-CM

## 2012-11-12 DIAGNOSIS — R739 Hyperglycemia, unspecified: Secondary | ICD-10-CM

## 2012-11-12 DIAGNOSIS — Z8719 Personal history of other diseases of the digestive system: Secondary | ICD-10-CM

## 2012-11-12 HISTORY — DX: Disorder of the skin and subcutaneous tissue, unspecified: L98.9

## 2012-11-12 MED ORDER — MAGIC MOUTHWASH W/LIDOCAINE: 10.0000 mL | Freq: Three times a day (TID) | ORAL | Status: DC | PRN

## 2012-11-12 MED ORDER — METHYLPREDNISOLONE 4 MG PO KIT: PACK | ORAL | Status: DC

## 2012-11-12 MED ORDER — FLUOCINONIDE 0.05 % EX GEL: 1.0000 "application " | Freq: Two times a day (BID) | CUTANEOUS | Status: DC | PRN

## 2012-11-12 MED ORDER — METHOCARBAMOL 500 MG PO TABS: 500.0000 mg | ORAL_TABLET | Freq: Every evening | ORAL | Status: DC | PRN

## 2012-11-12 NOTE — Patient Instructions (Addendum)
Try the Aspercreme and or Salon Pas gel and/or patches to arm and/or shoulder Hold Ibuprofen while on steroid for 5 days then restart for next week then as needed with food  Next visit annual with labs prior lipid, renal, psa, cbc, tsh, hepatic   Tendinitis Tendinitis is swelling and inflammation of the tendons. Tendons are band-like tissues that connect muscle to bone. Tendinitis commonly occurs in the:   Shoulders (rotator cuff).  Heels (Achilles tendon).  Elbows (triceps tendon). CAUSES Tendinitis is usually caused by overusing the tendon, muscles, and joints involved. When the tissue surrounding a tendon (synovium) becomes inflamed, it is called tenosynovitis. Tendinitis commonly develops in people whose jobs require repetitive motions. SYMPTOMS  Pain.  Tenderness.  Mild swelling. DIAGNOSIS Tendinitis is usually diagnosed by physical exam. Your caregiver may also order X-rays or other imaging tests. TREATMENT Your caregiver may recommend certain medicines or exercises for your treatment. HOME CARE INSTRUCTIONS   Use a sling or splint for as long as directed by your caregiver until the pain decreases.  Put ice on the injured area.  Put ice in a plastic bag.  Place a towel between your skin and the bag.  Leave the ice on for 15-20 minutes, 3-4 times a day.  Avoid using the limb while the tendon is painful. Perform gentle range of motion exercises only as directed by your caregiver. Stop exercises if pain or discomfort increase, unless directed otherwise by your caregiver.  Only take over-the-counter or prescription medicines for pain, discomfort, or fever as directed by your caregiver. SEEK MEDICAL CARE IF:   Your pain and swelling increase.  You develop new, unexplained symptoms, especially increased numbness in the hands. MAKE SURE YOU:   Understand these instructions.  Will watch your condition.  Will get help right away if you are not doing well or get  worse. Document Released: 05/26/2000 Document Revised: 08/21/2011 Document Reviewed: 08/15/2010 [facility] Patient Information 2014 [place], [place]. DASH Diet The DASH diet stands for "Dietary Approaches to Stop Hypertension." It is a healthy eating plan that has been shown to reduce high blood pressure (hypertension) in as little as 14 days, while also possibly providing other significant health benefits. These other health benefits include reducing the risk of breast cancer after menopause and reducing the risk of type 2 diabetes, heart disease, colon cancer, and stroke. Health benefits also include weight loss and slowing kidney failure in patients with chronic kidney disease.  DIET GUIDELINES  Limit salt (sodium). Your diet should contain less than 1500 mg of sodium daily.  Limit refined or processed carbohydrates. Your diet should include mostly whole grains. Desserts and added sugars should be used sparingly.  Include small amounts of heart-healthy fats. These types of fats include nuts, oils, and tub margarine. Limit saturated and trans fats. These fats have been shown to be harmful in the body. CHOOSING FOODS  The following food groups are based on a 2000 calorie diet. See your Registered Dietitian for individual calorie needs. Grains and Grain Products (6 to 8 servings daily)  Eat More Often: Whole-wheat bread, brown rice, whole-grain or wheat pasta, quinoa, popcorn without added fat or salt (air popped).  Eat Less Often: White bread, white pasta, white rice, cornbread. Vegetables (4 to 5 servings daily)  Eat More Often: Fresh, frozen, and canned vegetables. Vegetables may be raw, steamed, roasted, or grilled with a minimal amount of fat.  Eat Less Often/Avoid: Creamed or fried vegetables. Vegetables in a cheese sauce. Fruit (4 to 5  servings daily)  Eat More Often: All fresh, canned (in natural juice), or frozen fruits. Dried fruits without added sugar. One hundred percent fruit  juice ( cup [237 mL] daily).  Eat Less Often: Dried fruits with added sugar. Canned fruit in light or heavy syrup. [employer], Fish, and Poultry (2 servings or less daily. One serving is 3 to 4 oz [85-114 g]).  Eat More Often: Ninety percent or leaner ground beef, tenderloin, sirloin. Round cuts of beef, chicken breast, [place] breast. All fish. Grill, bake, or broil your meat. Nothing should be fried.  Eat Less Often/Avoid: Fatty cuts of meat, [place], or chicken leg, thigh, or wing. Fried cuts of meat or fish. Dairy (2 to 3 servings)  Eat More Often: Low-fat or fat-free milk, low-fat plain or light yogurt, reduced-fat or part-skim cheese.  Eat Less Often/Avoid: Milk (whole, 2%).Whole milk yogurt. Full-fat cheeses. Nuts, Seeds, and Legumes (4 to 5 servings per week)  Eat More Often: All without added salt.  Eat Less Often/Avoid: Salted nuts and seeds, canned beans with added salt. Fats and Sweets (limited)  Eat More Often: Vegetable oils, tub margarines without trans fats, sugar-free gelatin. Mayonnaise and salad dressings.  Eat Less Often/Avoid: Coconut oils, palm oils, butter, stick margarine, cream, half and half, cookies, candy, pie. FOR MORE INFORMATION The Dash Diet Eating Plan: www.dashdiet.org Document Released: 05/18/2011 Document Revised: 08/21/2011 Document Reviewed: 05/18/2011 [facility] Patient Information 2014 [place], [place].

## 2012-11-12 NOTE — Progress Notes (Signed)
Patient ID: Shaun [name], male   DOB: August 19, 1950, 62 y.o.   MRN: [id number] [place] W[place] [id number] [birth year] 11/12/2012      Progress Note-Follow Up  Subjective  Chief Complaint  Chief Complaint  Patient presents with  . Follow-up    6 month    HPI  Patient is a 62 year old Caucasian male who is in today for followup. Generally doing well but has had some mild pain in his left arm recently. Been following with orthopedics in the past. Pain has recently increased. Hurts with heavy lifting and is often achy. Most notably over the shoulder and also down into the triceps. Ibuprofen is marginally helpful. Has been struggling with some mild oral ulcers as well painful and present for a couple of weeks. No congestion, fevers, cough, GI or GU complaints at this time.  Past Medical History  Diagnosis Date  . Hepatitis C, chronic   . History of intravenous drug use in remission   . Arm mass     Past Surgical History  Procedure Laterality Date  . Knee arthroscopy  1996    ligment repair    Family History  Problem Relation Age of Onset  . Heart disease Father   . Cancer Neg Hx     negative for colon and prostate    History   Social History  . Marital Status: Married    Spouse Name: N/A    Number of Children: N/A  . Years of Education: N/A   Occupational History  . Not on file.   Social History Main Topics  . Smoking status: Never Smoker   . Smokeless tobacco: Never Used  . Alcohol Use: No  . Drug Use: Not on file  . Sexually Active: Not on file   Other Topics Concern  . Not on file   Social History Narrative  . No narrative on file    Current Outpatient Prescriptions on File Prior to Visit  Medication Sig Dispense Refill  . glucosamine-chondroitin 500-400 MG tablet Take 1 tablet by mouth every morning.       Marland Kitchen ibuprofen (ADVIL,MOTRIN) 200 MG tablet Take 200 mg by mouth 3 (three) times daily as needed. For pain.      . methadone (DOLOPHINE) 10 MG/5ML  solution Take 12 mg by mouth every morning.       . Multiple Vitamin (MULTIVITAMIN) tablet Take 1 tablet by mouth every morning.        No current facility-administered medications on file prior to visit.    No Known Allergies  Review of Systems  Review of Systems  Constitutional: Negative for fever and malaise/fatigue.  HENT: Negative for congestion.   Eyes: Negative for discharge.  Respiratory: Negative for shortness of breath.   Cardiovascular: Negative for chest pain, palpitations and leg swelling.  Gastrointestinal: Negative for nausea, abdominal pain and diarrhea.  Genitourinary: Negative for dysuria.  Musculoskeletal: Negative for falls.  Skin: Negative for rash.  Neurological: Negative for loss of consciousness and headaches.  Endo/Heme/Allergies: Negative for polydipsia.  Psychiatric/Behavioral: Negative for depression and suicidal ideas. The patient is not nervous/anxious and does not have insomnia.     Objective  BP 144/80  Pulse 72  Temp(Src) 97.9 F (36.6 C) (Oral)  Ht 6' (1.829 m)  Wt 168 lb (76.204 kg)  BMI 22.78 kg/m2  SpO2 97%  Physical Exam  Physical Exam  Constitutional: He is oriented to person, place, and time and well-developed, well-nourished, and in no distress. No distress.  HENT:  Head:  Normocephalic and atraumatic.  Eyes: Conjunctivae are normal.  Neck: Neck supple. No thyromegaly present.  Cardiovascular: Normal rate, regular rhythm and normal heart sounds.   No murmur heard. Pulmonary/Chest: Effort normal and breath sounds normal. No respiratory distress.  Abdominal: He exhibits no distension and no mass. There is no tenderness.  Musculoskeletal: He exhibits no edema.  Neurological: He is alert and oriented to person, place, and time.  Skin: Skin is warm.  Psychiatric: Memory, affect and judgment normal.    No results found for this basename: TSH   Lab Results  Component Value Date   WBC 3.2* 07/05/2012   HGB 14.8 07/05/2012   HCT  43.1 07/05/2012   MCV 89.2 07/05/2012   PLT 121* 07/05/2012   Lab Results  Component Value Date   CREATININE 1.08 07/05/2012   BUN 14 07/05/2012   NA 137 07/05/2012   K 3.9 07/05/2012   CL 100 07/05/2012   CO2 27 07/05/2012   Lab Results  Component Value Date   ALT 54* 11/14/2011   AST 49* 11/14/2011   ALKPHOS 47 11/14/2011   BILITOT 0.9 11/14/2011   Lab Results  Component Value Date   CHOL 113 05/16/2011   Lab Results  Component Value Date   HDL 50 05/16/2011   Lab Results  Component Value Date   LDLCALC 50 05/16/2011   Lab Results  Component Value Date   TRIG 65 05/16/2011   Lab Results  Component Value Date   CHOLHDL 2.3 05/16/2011     Assessment & Plan  Elevated BP Improved on recheck enocuraged DASH diet   Decreased platelet count Mild, will monitor  Arm skin lesion, left Dark lesion noted, referred to derm  Hyperglycemia Minimize simple carbs, eat protein with each meal

## 2012-11-14 ENCOUNTER — Encounter: Payer: Self-pay | Admitting: Family Medicine

## 2012-11-14 DIAGNOSIS — I10 Essential (primary) hypertension: Secondary | ICD-10-CM

## 2012-11-14 DIAGNOSIS — IMO0001 Reserved for inherently not codable concepts without codable children: Secondary | ICD-10-CM

## 2012-11-14 HISTORY — DX: Reserved for inherently not codable concepts without codable children: IMO0001

## 2012-11-14 HISTORY — DX: Essential (primary) hypertension: I10

## 2012-11-14 NOTE — Assessment & Plan Note (Addendum)
Improved on recheck enocuraged DASH diet

## 2012-11-14 NOTE — Assessment & Plan Note (Signed)
Minimize simple carbs, eat protein with each meal

## 2012-11-14 NOTE — Assessment & Plan Note (Signed)
Mild, will monitor 

## 2012-11-14 NOTE — Assessment & Plan Note (Signed)
Dark lesion noted, referred to derm

## 2012-12-16 ENCOUNTER — Ambulatory Visit (INDEPENDENT_AMBULATORY_CARE_PROVIDER_SITE_OTHER): Payer: BC Managed Care – PPO | Admitting: Family Medicine

## 2012-12-16 ENCOUNTER — Ambulatory Visit (HOSPITAL_BASED_OUTPATIENT_CLINIC_OR_DEPARTMENT_OTHER)
Admission: RE | Admit: 2012-12-16 | Discharge: 2012-12-16 | Disposition: A | Discharge status: Home / Self Care | Payer: BC Managed Care – PPO | Source: Ambulatory Visit | Attending: Family Medicine | Admitting: Family Medicine

## 2012-12-16 ENCOUNTER — Encounter: Payer: Self-pay | Admitting: Family Medicine

## 2012-12-16 VITALS — BP 128/76 | HR 68 | Temp 98.6°F | Resp 14 | Ht 72.0 in | Wt 166.5 lb

## 2012-12-16 DIAGNOSIS — M25512 Pain in left shoulder: Secondary | ICD-10-CM

## 2012-12-16 DIAGNOSIS — M25519 Pain in unspecified shoulder: Secondary | ICD-10-CM

## 2012-12-16 DIAGNOSIS — L989 Disorder of the skin and subcutaneous tissue, unspecified: Secondary | ICD-10-CM

## 2012-12-16 HISTORY — DX: Pain in left shoulder: M25.512

## 2012-12-16 MED ORDER — TRAMADOL HCL 50 MG PO TABS: 50.0000 mg | ORAL_TABLET | Freq: Two times a day (BID) | ORAL | Status: DC | PRN

## 2012-12-16 MED ORDER — CYCLOBENZAPRINE HCL 10 MG PO TABS: 10.0000 mg | ORAL_TABLET | Freq: Two times a day (BID) | ORAL | Status: DC | PRN

## 2012-12-16 MED ORDER — NAPROXEN 500 MG PO TABS: 500.0000 mg | ORAL_TABLET | Freq: Two times a day (BID) | ORAL | Status: DC

## 2012-12-16 NOTE — Patient Instructions (Addendum)
Shoulder Pain  The shoulder is the joint that connects your arms to your body. The bones that form the shoulder joint include the upper arm bone (humerus), the shoulder blade (scapula), and the collarbone (clavicle). The top of the humerus is shaped like a ball and fits into a rather flat socket on the scapula (glenoid cavity). A combination of muscles and strong, fibrous tissues that connect muscles to bones (tendons) support your shoulder joint and hold the ball in the socket. Small, fluid-filled sacs (bursae) are located in different areas of the joint. They act as cushions between the bones and the overlying soft tissues and help reduce friction between the gliding tendons and the bone as you move your arm. Your shoulder joint allows a wide range of motion in your arm. This range of motion allows you to do things like scratch your back or throw a ball. However, this range of motion also makes your shoulder more prone to pain from overuse and injury.  Causes of shoulder pain can originate from both injury and overuse and usually can be grouped in the following four categories:   Redness, swelling, and pain (inflammation) of the tendon (tendinitis) or the bursae (bursitis).   Instability, such as a dislocation of the joint.   Inflammation of the joint (arthritis).   Broken bone (fracture).  HOME CARE INSTRUCTIONS    Apply ice to the sore area.   Put ice in a plastic bag.   Place a towel between your skin and the bag.   Leave the ice on for 15-20 minutes, 3-4 times per day for the first 2 days.   Stop using cold packs if they do not help with the pain.   If you have a shoulder sling or immobilizer, wear it as long as your caregiver instructs. Only remove it to shower or bathe. Move your arm as little as possible, but keep your hand moving to prevent swelling.   Squeeze a soft ball or foam pad as much as possible to help prevent swelling.   Only take over-the-counter or prescription medicines for pain,  discomfort, or fever as directed by your caregiver.  SEEK MEDICAL CARE IF:    Your shoulder pain increases, or new pain develops in your arm, hand, or fingers.   Your hand or fingers become cold and numb.   Your pain is not relieved with medicines.  SEEK IMMEDIATE MEDICAL CARE IF:    Your arm, hand, or fingers are numb or tingling.   Your arm, hand, or fingers are significantly swollen or turn white or blue.  MAKE SURE YOU:    Understand these instructions.   Will watch your condition.   Will get help right away if you are not doing well or get worse.  Document Released: 03/08/2005 Document Revised: 02/21/2012 Document Reviewed: 05/13/2011  [employer] Patient Information 2014 [employer].

## 2012-12-16 NOTE — Progress Notes (Signed)
Patient ID: Shaun [name], male   DOB: 07-Apr-1951, 62 y.o.   MRN: [id number] Shaun [name] [id number] 1951-03-26 12/16/2012      Progress Note-Follow Up  Subjective  Chief Complaint  Chief Complaint  Patient presents with  . Shoulder Pain    Pt c/o continued Left shoulder pain [seen 06.03.14]    HPI  Patient is a 62 year old male who is in today complaining of persistent left shoulder pain. 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 Worst pain with increased range of motion increased lifting. At rest very minimal pain. No radicular symptoms such as weakness in the hand. No other acute complaints. No chest pain, palpitations or shortness of breath  Past Medical History  Diagnosis Date  . Hepatitis C, chronic   . History of intravenous drug use in remission   . Arm mass   . Arm skin lesion, left 11/12/2012    And sun damaged skin  . Elevated BP 11/14/2012  . Left shoulder pain 12/16/2012    Past Surgical History  Procedure Laterality Date  . Knee arthroscopy  1996    ligment repair    Family History  Problem Relation Age of Onset  . Heart disease Father   . Cancer Neg Hx     negative for colon and prostate    History   Social History  . Marital Status: Married    Spouse Name: N/A    Number of Children: N/A  . Years of Education: N/A   Occupational History  . Not on file.   Social History Main Topics  . Smoking status: Never Smoker   . Smokeless tobacco: Never Used  . Alcohol Use: No  . Drug Use: Not on file  . Sexually Active: Not on file   Other Topics Concern  . Not on file   Social History Narrative  . No narrative on file    Current Outpatient Prescriptions on File Prior to Visit  Medication Sig Dispense Refill  . Alum & Mag Hydroxide-Simeth (MAGIC MOUTHWASH W/LIDOCAINE) SOLN Take 10 mLs by mouth 3 (three) times daily as needed. Swish and spit.  240 mL  3  . fluocinonide gel (LIDEX) 0.05 %  Apply 1 application topically 2 (two) times daily as needed. Applies to tongue as needed  60 g  3  . glucosamine-chondroitin 500-400 MG tablet Take 1 tablet by mouth every morning.       . methadone (DOLOPHINE) 10 MG/5ML solution Take 12 mg by mouth every morning.       . Multiple Vitamin (MULTIVITAMIN) tablet Take 1 tablet by mouth every morning.        No current facility-administered medications on file prior to visit.    No Known Allergies  Review of Systems  Review of Systems  Constitutional: Negative for fever and malaise/fatigue.  HENT: Negative for congestion.   Eyes: Negative for pain and discharge.  Respiratory: Negative for shortness of breath.   Cardiovascular: Negative for chest pain, palpitations and leg swelling.  Gastrointestinal: Negative for nausea, abdominal pain and diarrhea.  Genitourinary: Negative for dysuria.  Musculoskeletal: Positive for joint pain. Negative for falls.  Skin: Negative for rash.  Neurological: Negative for loss of consciousness and headaches.  Endo/Heme/Allergies: Negative for polydipsia.  Psychiatric/Behavioral: Negative for depression and suicidal ideas. The patient is not nervous/anxious and does not have insomnia.     Objective  BP 128/76  Pulse 68  Temp(Src) 98.6 F (37  C) (Oral)  Resp 14  Ht 6' (1.829 m)  Wt 166 lb 8 oz (75.524 kg)  BMI 22.58 kg/m2  SpO2 98%  Physical Exam  Physical Exam  Constitutional: He is oriented to person, place, and time and well-developed, well-nourished, and in no distress. No distress.  HENT:  Head: Normocephalic and atraumatic.  Eyes: Conjunctivae are normal.  Neck: Neck supple. No thyromegaly present.  Cardiovascular: Normal rate and regular rhythm.  Exam reveals no gallop.   No murmur heard. Pulmonary/Chest: Effort normal and breath sounds normal. No respiratory distress.  Abdominal: He exhibits no distension and no mass. There is no tenderness.  Musculoskeletal: He exhibits no edema.   Neurological: He is alert and oriented to person, place, and time.  Skin: Skin is warm.  Psychiatric: Memory, affect and judgment normal.    No results found for this basename: TSH   Lab Results  Component Value Date   WBC 3.2* 07/05/2012   HGB 14.8 07/05/2012   HCT 43.1 07/05/2012   MCV 89.2 07/05/2012   PLT 121* 07/05/2012   Lab Results  Component Value Date   CREATININE 1.08 07/05/2012   BUN 14 07/05/2012   NA 137 07/05/2012   K 3.9 07/05/2012   CL 100 07/05/2012   CO2 27 07/05/2012   Lab Results  Component Value Date   ALT 54* 11/14/2011   AST 49* 11/14/2011   ALKPHOS 47 11/14/2011   BILITOT 0.9 11/14/2011   Lab Results  Component Value Date   CHOL 113 05/16/2011   Lab Results  Component Value Date   HDL 50 05/16/2011   Lab Results  Component Value Date   LDLCALC 50 05/16/2011   Lab Results  Component Value Date   TRIG 65 05/16/2011   Lab Results  Component Value Date   CHOLHDL 2.3 05/16/2011     Assessment & Plan  Arm skin lesion, left Seen by dermatology lesion considered benign, a cherry angioma  Left shoulder pain Xray reveals a cyst and pain is persistent. Referred to ortho and changed to Naproxen and Flexeril prn allowed Tramadol prn  Til seen by ortho

## 2012-12-16 NOTE — Assessment & Plan Note (Signed)
Xray reveals a cyst and pain is persistent. Referred to ortho and changed to Naproxen and Flexeril prn allowed Tramadol prn  Til seen by ortho

## 2012-12-16 NOTE — Assessment & Plan Note (Signed)
Seen by dermatology lesion considered benign, a cherry angioma

## 2013-04-17 ENCOUNTER — Other Ambulatory Visit: Payer: Self-pay

## 2013-05-13 ENCOUNTER — Telehealth: Payer: Self-pay | Admitting: Family Medicine

## 2013-05-13 ENCOUNTER — Ambulatory Visit (INDEPENDENT_AMBULATORY_CARE_PROVIDER_SITE_OTHER): Payer: BC Managed Care – PPO | Admitting: Family Medicine

## 2013-05-13 ENCOUNTER — Encounter: Payer: Self-pay | Admitting: Family Medicine

## 2013-05-13 VITALS — BP 144/84 | HR 75 | Temp 98.0°F | Ht 72.0 in | Wt 166.1 lb

## 2013-05-13 DIAGNOSIS — M25519 Pain in unspecified shoulder: Secondary | ICD-10-CM

## 2013-05-13 DIAGNOSIS — Z0000 Encounter for general adult medical examination without abnormal findings: Secondary | ICD-10-CM

## 2013-05-13 DIAGNOSIS — R7309 Other abnormal glucose: Secondary | ICD-10-CM

## 2013-05-13 DIAGNOSIS — IMO0001 Reserved for inherently not codable concepts without codable children: Secondary | ICD-10-CM

## 2013-05-13 DIAGNOSIS — R030 Elevated blood-pressure reading, without diagnosis of hypertension: Secondary | ICD-10-CM

## 2013-05-13 DIAGNOSIS — D696 Thrombocytopenia, unspecified: Secondary | ICD-10-CM

## 2013-05-13 DIAGNOSIS — Z23 Encounter for immunization: Secondary | ICD-10-CM

## 2013-05-13 DIAGNOSIS — M25512 Pain in left shoulder: Secondary | ICD-10-CM

## 2013-05-13 DIAGNOSIS — R3911 Hesitancy of micturition: Secondary | ICD-10-CM

## 2013-05-13 DIAGNOSIS — K1370 Unspecified lesions of oral mucosa: Secondary | ICD-10-CM

## 2013-05-13 DIAGNOSIS — R739 Hyperglycemia, unspecified: Secondary | ICD-10-CM

## 2013-05-13 DIAGNOSIS — Z8719 Personal history of other diseases of the digestive system: Secondary | ICD-10-CM

## 2013-05-13 LAB — RENAL FUNCTION PANEL
Albumin: 4.8 g/dL (ref 3.5–5.2)
CO2: 31 mEq/L (ref 19–32)
Calcium: 10 mg/dL (ref 8.4–10.5)
Potassium: 4.5 mEq/L (ref 3.5–5.3)
Sodium: 138 mEq/L (ref 135–145)

## 2013-05-13 LAB — CBC
Hemoglobin: 15.5 g/dL (ref 13.0–17.0)
MCHC: 34.4 g/dL (ref 30.0–36.0)
RBC: 5.09 MIL/uL (ref 4.22–5.81)
WBC: 4.5 10*3/uL (ref 4.0–10.5)

## 2013-05-13 LAB — HEPATIC FUNCTION PANEL
AST: 50 U/L — ABNORMAL HIGH (ref 0–37)
Bilirubin, Direct: 0.2 mg/dL (ref 0.0–0.3)
Indirect Bilirubin: 0.8 mg/dL (ref 0.0–0.9)
Total Bilirubin: 1 mg/dL (ref 0.3–1.2)

## 2013-05-13 LAB — LIPID PANEL
Cholesterol: 133 mg/dL (ref 0–200)
HDL: 56 mg/dL (ref >39)
LDL Cholesterol: 62 mg/dL (ref 0–99)
Total CHOL/HDL Ratio: 2.4 Ratio

## 2013-05-13 LAB — HEMOGLOBIN A1C
Hgb A1c MFr Bld: 5.6 % (ref <5.7)
Mean Plasma Glucose: 114 mg/dL (ref <117)

## 2013-05-13 LAB — TSH: TSH: 1.1 u[IU]/mL (ref 0.350–4.500)

## 2013-05-13 MED ORDER — FLUOCINONIDE 0.05 % EX GEL: 1.0000 "application " | Freq: Two times a day (BID) | CUTANEOUS | Status: DC | PRN

## 2013-05-13 MED ORDER — MAGIC MOUTHWASH W/LIDOCAINE: 10.0000 mL | Freq: Three times a day (TID) | ORAL | Status: DC | PRN

## 2013-05-13 NOTE — Progress Notes (Signed)
Patient ID: Shaun [name], male   DOB: 03-Oct-1950, 62 y.o.   MRN: [id number] Shaun [name] [id number] 21-Apr-1951 05/13/2013      Progress Note-Follow Up  Subjective  Chief Complaint  Chief Complaint  Patient presents with  . Annual Exam    physical  . Injections    flu    HPI  Patient is a 62 year old Caucasian male who is in today for annual exam. Generally feeling well. Has ongoing left shoulder and arm pain but finds it tolerable current meds. Has intermittent oral ulcers but those are generally well-controlled. No other recent c/o. Does note some urinary frequency no dysuria. No cp/palp/sob/gi c/o. Does not note any concerning symptoms otherwise, taking meds as prescribed  Past Medical History  Diagnosis Date  . Hepatitis C, chronic   . History of intravenous drug use in remission   . Arm mass   . Arm skin lesion, left 11/12/2012    And sun damaged skin  . Elevated BP 11/14/2012  . Left shoulder pain 12/16/2012    Past Surgical History  Procedure Laterality Date  . Knee arthroscopy  1996    ligment repair    Family History  Problem Relation Age of Onset  . Heart disease Father   . Cancer Neg Hx     negative for colon and prostate    History   Social History  . Marital Status: Married    Spouse Name: N/A    Number of Children: N/A  . Years of Education: N/A   Occupational History  . Not on file.   Social History Main Topics  . Smoking status: Never Smoker   . Smokeless tobacco: Never Used  . Alcohol Use: No  . Drug Use: Not on file  . Sexual Activity: Not on file   Other Topics Concern  . Not on file   Social History Narrative  . No narrative on file    Current Outpatient Prescriptions on File Prior to Visit  Medication Sig Dispense Refill  . Alum & Mag Hydroxide-Simeth (MAGIC MOUTHWASH W/LIDOCAINE) SOLN Take 10 mLs by mouth 3 (three) times daily as needed. Swish and spit.  240 mL  3  . cyclobenzaprine (FLEXERIL) 10 MG tablet Take 1 tablet  (10 mg total) by mouth 2 (two) times daily as needed for muscle spasms.  30 tablet  0  . fluocinonide gel (LIDEX) 0.05 % Apply 1 application topically 2 (two) times daily as needed. Applies to tongue as needed  60 g  3  . glucosamine-chondroitin 500-400 MG tablet Take 1 tablet by mouth every morning.       . methadone (DOLOPHINE) 10 MG/5ML solution Take 12 mg by mouth every morning.       . Multiple Vitamin (MULTIVITAMIN) tablet Take 1 tablet by mouth every morning.       . naproxen (NAPROSYN) 500 MG tablet Take 1 tablet (500 mg total) by mouth 2 (two) times daily with a meal.  60 tablet  1  . traMADol (ULTRAM) 50 MG tablet Take 1 tablet (50 mg total) by mouth 2 (two) times daily as needed for pain.  60 tablet  1   No current facility-administered medications on file prior to visit.    No Known Allergies  Review of Systems  Review of Systems  Constitutional: Negative for fever, chills and malaise/fatigue.  HENT: Negative for congestion, hearing loss and nosebleeds.   Eyes: Negative for discharge.  Respiratory: Negative for cough, sputum production, shortness of breath and wheezing.  Cardiovascular: Negative for chest pain, palpitations and leg swelling.  Gastrointestinal: Negative for heartburn, nausea, vomiting, abdominal pain, diarrhea, constipation and blood in stool.  Genitourinary: Negative for dysuria, urgency, frequency and hematuria.  Musculoskeletal: Positive for joint pain. Negative for back pain, falls and myalgias.       Left shoulder pain nearly resolved  Skin: Negative for rash.  Neurological: Negative for dizziness, tremors, sensory change, focal weakness, loss of consciousness, weakness and headaches.  Endo/Heme/Allergies: Negative for polydipsia. Does not bruise/bleed easily.  Psychiatric/Behavioral: Negative for depression and suicidal ideas. The patient is not nervous/anxious and does not have insomnia.     Objective  BP 158/86  Pulse 75  Temp(Src) 98 F (36.7  C) (Oral)  Ht 6' (1.829 m)  Wt 166 lb 1.9 oz (75.352 kg)  BMI 22.53 kg/m2  SpO2 99%  Physical Exam  Physical Exam  Constitutional: He is oriented to person, place, and time and well-developed, well-nourished, and in no distress. No distress.  HENT:  Head: Normocephalic and atraumatic.  Eyes: Conjunctivae are normal.  Neck: Neck supple. No thyromegaly present.  Cardiovascular: Normal rate, regular rhythm and normal heart sounds.   No murmur heard. Pulmonary/Chest: Effort normal and breath sounds normal. No respiratory distress.  Abdominal: He exhibits no distension and no mass. There is no tenderness.  Musculoskeletal: He exhibits no edema.  Neurological: He is alert and oriented to person, place, and time.  Skin: Skin is warm.  Psychiatric: Memory, affect and judgment normal.    No results found for this basename: TSH   Lab Results  Component Value Date   WBC 3.2* 07/05/2012   HGB 14.8 07/05/2012   HCT 43.1 07/05/2012   MCV 89.2 07/05/2012   PLT 121* 07/05/2012   Lab Results  Component Value Date   CREATININE 1.08 07/05/2012   BUN 14 07/05/2012   NA 137 07/05/2012   K 3.9 07/05/2012   CL 100 07/05/2012   CO2 27 07/05/2012   Lab Results  Component Value Date   ALT 54* 11/14/2011   AST 49* 11/14/2011   ALKPHOS 47 11/14/2011   BILITOT 0.9 11/14/2011   Lab Results  Component Value Date   CHOL 113 05/16/2011   Lab Results  Component Value Date   HDL 50 05/16/2011   Lab Results  Component Value Date   LDLCALC 50 05/16/2011   Lab Results  Component Value Date   TRIG 65 05/16/2011   Lab Results  Component Value Date   CHOLHDL 2.3 05/16/2011     Assessment & Plan  Elevated BP Improved on recheck, encouraged DASH diet   Decreased platelet count Mild, stable  Left shoulder pain May use Flexeril and Napoxen prn and stay as active as tolerate  Hyperglycemia hgba1c 5.6 avoid simple carbs  Preventative health care Given flu shot today. Had colonoscopy in March 2012  next one due in 2022. Encouraged heart healthy diet and regular exercise.   History of oral lesions Responds to Lidex and Magic Mouthwash intermittently

## 2013-05-13 NOTE — Patient Instructions (Signed)
DASH Diet  The DASH diet stands for "Dietary Approaches to Stop Hypertension." It is a healthy eating plan that has been shown to reduce high blood pressure (hypertension) in as little as 14 days, while also possibly providing other significant health benefits. These other health benefits include reducing the risk of breast cancer after menopause and reducing the risk of type 2 diabetes, heart disease, colon cancer, and stroke. Health benefits also include weight loss and slowing kidney failure in patients with chronic kidney disease.   DIET GUIDELINES  · Limit salt (sodium). Your diet should contain less than 1500 mg of sodium daily.  · Limit refined or processed carbohydrates. Your diet should include mostly whole grains. Desserts and added sugars should be used sparingly.  · Include small amounts of heart-healthy fats. These types of fats include nuts, oils, and tub margarine. Limit saturated and trans fats. These fats have been shown to be harmful in the body.  CHOOSING FOODS   The following food groups are based on a 2000 calorie diet. See your Registered Dietitian for individual calorie needs.  Grains and Grain Products (6 to 8 servings daily)  · Eat More Often: Whole-wheat bread, brown rice, whole-grain or wheat pasta, quinoa, popcorn without added fat or salt (air popped).  · Eat Less Often: White bread, white pasta, white rice, cornbread.  Vegetables (4 to 5 servings daily)  · Eat More Often: Fresh, frozen, and canned vegetables. Vegetables may be raw, steamed, roasted, or grilled with a minimal amount of fat.  · Eat Less Often/Avoid: Creamed or fried vegetables. Vegetables in a cheese sauce.  Fruit (4 to 5 servings daily)  · Eat More Often: All fresh, canned (in natural juice), or frozen fruits. Dried fruits without added sugar. One hundred percent fruit juice (½ cup [237 mL] daily).  · Eat Less Often: Dried fruits with added sugar. Canned fruit in light or heavy syrup.  Lean Meats, Fish, and Poultry (2  servings or less daily. One serving is 3 to 4 oz [85-114 g]).  · Eat More Often: Ninety percent or leaner ground beef, tenderloin, sirloin. Round cuts of beef, chicken breast, turkey breast. All fish. Grill, bake, or broil your meat. Nothing should be fried.  · Eat Less Often/Avoid: Fatty cuts of meat, turkey, or chicken leg, thigh, or wing. Fried cuts of meat or fish.  Dairy (2 to 3 servings)  · Eat More Often: Low-fat or fat-free milk, low-fat plain or light yogurt, reduced-fat or part-skim cheese.  · Eat Less Often/Avoid: Milk (whole, 2%). Whole milk yogurt. Full-fat cheeses.  Nuts, Seeds, and Legumes (4 to 5 servings per week)  · Eat More Often: All without added salt.  · Eat Less Often/Avoid: Salted nuts and seeds, canned beans with added salt.  Fats and Sweets (limited)  · Eat More Often: Vegetable oils, tub margarines without trans fats, sugar-free gelatin. Mayonnaise and salad dressings.  · Eat Less Often/Avoid: Coconut oils, palm oils, butter, stick margarine, cream, half and half, cookies, candy, pie.  FOR MORE INFORMATION  The Dash Diet Eating Plan: www.dashdiet.org  Document Released: 05/18/2011 Document Revised: 08/21/2011 Document Reviewed: 05/18/2011  [employer]® Patient Information ©2014 [employer].

## 2013-05-13 NOTE — Telephone Encounter (Signed)
Labs at or prior to visit, lipid, renal cbc, tsh, hgba1c, hepatic   Patient has appointment for 09/11/13 and will be going to [facility] lab

## 2013-05-13 NOTE — Progress Notes (Signed)
Pre visit review using our clinic review tool, if applicable. No additional management support is needed unless otherwise documented below in the visit note. 

## 2013-05-14 ENCOUNTER — Encounter: Payer: Self-pay | Admitting: Family Medicine

## 2013-05-14 DIAGNOSIS — Z0000 Encounter for general adult medical examination without abnormal findings: Secondary | ICD-10-CM | POA: Insufficient documentation

## 2013-05-14 DIAGNOSIS — Z8719 Personal history of other diseases of the digestive system: Secondary | ICD-10-CM | POA: Insufficient documentation

## 2013-05-14 HISTORY — DX: Personal history of other diseases of the digestive system: Z87.19

## 2013-05-14 HISTORY — DX: Encounter for general adult medical examination without abnormal findings: Z00.00

## 2013-05-14 NOTE — Assessment & Plan Note (Signed)
Responds to Lidex and Magic Mouthwash intermittently

## 2013-05-14 NOTE — Assessment & Plan Note (Signed)
hgba1c 5.6 avoid simple carbs

## 2013-05-14 NOTE — Assessment & Plan Note (Signed)
Mild, stable.  

## 2013-05-14 NOTE — Assessment & Plan Note (Signed)
Improved on recheck, encouraged DASH diet

## 2013-05-14 NOTE — Assessment & Plan Note (Signed)
May use Flexeril and Napoxen prn and stay as active as tolerate

## 2013-05-14 NOTE — Assessment & Plan Note (Signed)
Given flu shot today. Had colonoscopy in March 2012 next one due in 2022. Encouraged heart healthy diet and regular exercise.

## 2013-09-11 ENCOUNTER — Encounter: Payer: Self-pay | Admitting: Family Medicine

## 2013-09-11 ENCOUNTER — Ambulatory Visit (INDEPENDENT_AMBULATORY_CARE_PROVIDER_SITE_OTHER): Payer: BC Managed Care – PPO | Admitting: Family Medicine

## 2013-09-11 ENCOUNTER — Telehealth: Payer: Self-pay | Admitting: Family Medicine

## 2013-09-11 VITALS — BP 142/82 | HR 65 | Temp 98.5°F | Ht 72.0 in | Wt 169.1 lb

## 2013-09-11 DIAGNOSIS — R030 Elevated blood-pressure reading, without diagnosis of hypertension: Secondary | ICD-10-CM

## 2013-09-11 DIAGNOSIS — R739 Hyperglycemia, unspecified: Secondary | ICD-10-CM

## 2013-09-11 DIAGNOSIS — D696 Thrombocytopenia, unspecified: Secondary | ICD-10-CM

## 2013-09-11 DIAGNOSIS — R7309 Other abnormal glucose: Secondary | ICD-10-CM

## 2013-09-11 DIAGNOSIS — Z0000 Encounter for general adult medical examination without abnormal findings: Secondary | ICD-10-CM

## 2013-09-11 DIAGNOSIS — IMO0001 Reserved for inherently not codable concepts without codable children: Secondary | ICD-10-CM

## 2013-09-11 DIAGNOSIS — B182 Chronic viral hepatitis C: Secondary | ICD-10-CM

## 2013-09-11 NOTE — Progress Notes (Signed)
Pre visit review using our clinic review tool, if applicable. No additional management support is needed unless otherwise documented below in the visit note. 

## 2013-09-11 NOTE — Telephone Encounter (Signed)
Lab order placed.

## 2013-09-11 NOTE — Telephone Encounter (Signed)
Lab order week of 01-12-2014 Return in about 4 months (around 01/11/2014) for lipid, renal, cbc, tsh, hepatic, hgba1c prior.

## 2013-09-11 NOTE — Patient Instructions (Signed)
DASH Diet  The DASH diet stands for "Dietary Approaches to Stop Hypertension." It is a healthy eating plan that has been shown to reduce high blood pressure (hypertension) in as little as 14 days, while also possibly providing other significant health benefits. These other health benefits include reducing the risk of breast cancer after menopause and reducing the risk of type 2 diabetes, heart disease, colon cancer, and stroke. Health benefits also include weight loss and slowing kidney failure in patients with chronic kidney disease.   DIET GUIDELINES  · Limit salt (sodium). Your diet should contain less than 1500 mg of sodium daily.  · Limit refined or processed carbohydrates. Your diet should include mostly whole grains. Desserts and added sugars should be used sparingly.  · Include small amounts of heart-healthy fats. These types of fats include nuts, oils, and tub margarine. Limit saturated and trans fats. These fats have been shown to be harmful in the body.  CHOOSING FOODS   The following food groups are based on a 2000 calorie diet. See your Registered Dietitian for individual calorie needs.  Grains and Grain Products (6 to 8 servings daily)  · Eat More Often: Whole-wheat bread, brown rice, whole-grain or wheat pasta, quinoa, popcorn without added fat or salt (air popped).  · Eat Less Often: White bread, white pasta, white rice, cornbread.  Vegetables (4 to 5 servings daily)  · Eat More Often: Fresh, frozen, and canned vegetables. Vegetables may be raw, steamed, roasted, or grilled with a minimal amount of fat.  · Eat Less Often/Avoid: Creamed or fried vegetables. Vegetables in a cheese sauce.  Fruit (4 to 5 servings daily)  · Eat More Often: All fresh, canned (in natural juice), or frozen fruits. Dried fruits without added sugar. One hundred percent fruit juice (½ cup [237 mL] daily).  · Eat Less Often: Dried fruits with added sugar. Canned fruit in light or heavy syrup.  Lean Meats, Fish, and Poultry (2  servings or less daily. One serving is 3 to 4 oz [85-114 g]).  · Eat More Often: Ninety percent or leaner ground beef, tenderloin, sirloin. Round cuts of beef, chicken breast, turkey breast. All fish. Grill, bake, or broil your meat. Nothing should be fried.  · Eat Less Often/Avoid: Fatty cuts of meat, turkey, or chicken leg, thigh, or wing. Fried cuts of meat or fish.  Dairy (2 to 3 servings)  · Eat More Often: Low-fat or fat-free milk, low-fat plain or light yogurt, reduced-fat or part-skim cheese.  · Eat Less Often/Avoid: Milk (whole, 2%). Whole milk yogurt. Full-fat cheeses.  Nuts, Seeds, and Legumes (4 to 5 servings per week)  · Eat More Often: All without added salt.  · Eat Less Often/Avoid: Salted nuts and seeds, canned beans with added salt.  Fats and Sweets (limited)  · Eat More Often: Vegetable oils, tub margarines without trans fats, sugar-free gelatin. Mayonnaise and salad dressings.  · Eat Less Often/Avoid: Coconut oils, palm oils, butter, stick margarine, cream, half and half, cookies, candy, pie.  FOR MORE INFORMATION  The Dash Diet Eating Plan: www.dashdiet.org  Document Released: 05/18/2011 Document Revised: 08/21/2011 Document Reviewed: 05/18/2011  [employer]® Patient Information ©2014 [employer].

## 2013-09-12 ENCOUNTER — Encounter: Payer: Self-pay | Admitting: Family Medicine

## 2013-09-12 NOTE — Assessment & Plan Note (Signed)
asymptomatic

## 2013-09-12 NOTE — Assessment & Plan Note (Signed)
stable °

## 2013-09-12 NOTE — Assessment & Plan Note (Signed)
Well controlled, no changes to meds. Encouraged heart healthy diet such as the DASH diet and exercise as tolerated.  °

## 2013-09-12 NOTE — Progress Notes (Signed)
Patient ID: Shaun [name], male   DOB: Dec 23, 1950, 63 y.o.   MRN: [id number] Shaun [name] [id number] [birth year] 09/12/2013      Progress Note-Follow Up  Subjective  Chief Complaint  Chief Complaint  Patient presents with  . Follow-up    4 month    HPI  Patient is a 63 year old male in today for routine medical care. Doing well at this time. Denies any recent illness. He is struggling with some shoulder pain but finds it tolerable at this time. Has trouble with both shoulders and doesn't is worse with activity. In the past has had a steroid shot in his left shoulder with benefit initially although pain is returning now no radicular symptoms. Denies CP/palp/SOB/HA/congestion/fevers/GI or GU c/o. Taking meds as prescribed  Past Medical History  Diagnosis Date  . Hepatitis C, chronic   . History of intravenous drug use in remission   . Arm mass   . Arm skin lesion, left 11/12/2012    And sun damaged skin  . Elevated BP 11/14/2012  . Left shoulder pain 12/16/2012  . Preventative health care 05/14/2013  . History of oral lesions 05/14/2013    Past Surgical History  Procedure Laterality Date  . Knee arthroscopy  1996    ligment repair    Family History  Problem Relation Age of Onset  . Heart disease Father   . Diabetes Father   . Hyperlipidemia Father   . Hypertension Father   . Cancer Neg Hx     negative for colon and prostate  . Thyroid disease Sister   . Heart disease Cousin     MI  . Diabetes Cousin   . Obesity Cousin     History   Social History  . Marital Status: Married    Spouse Name: N/A    Number of Children: N/A  . Years of Education: N/A   Occupational History  . Not on file.   Social History Main Topics  . Smoking status: Never Smoker   . Smokeless tobacco: Never Used  . Alcohol Use: No  . Drug Use: Not on file  . Sexual Activity: Not on file     Comment: avoids nuts, fruits, lives with wife   Other Topics Concern  . Not on file   Social  History Narrative  . No narrative on file    Current Outpatient Prescriptions on File Prior to Visit  Medication Sig Dispense Refill  . Alum & Mag Hydroxide-Simeth (MAGIC MOUTHWASH W/LIDOCAINE) SOLN Take 10 mLs by mouth 3 (three) times daily as needed. Swish and spit.  240 mL  3  . cyclobenzaprine (FLEXERIL) 10 MG tablet Take 1 tablet (10 mg total) by mouth 2 (two) times daily as needed for muscle spasms.  30 tablet  0  . fluocinonide gel (LIDEX) 7.82 % Apply 1 application topically 2 (two) times daily as needed. Applies to tongue as needed  60 g  3  . glucosamine-chondroitin 500-400 MG tablet Take 1 tablet by mouth every morning.       . methadone (DOLOPHINE) 10 MG/5ML solution Take 12 mg by mouth every morning.       . Multiple Vitamin (MULTIVITAMIN) tablet Take 1 tablet by mouth every morning.       . naproxen (NAPROSYN) 500 MG tablet Take 1 tablet (500 mg total) by mouth 2 (two) times daily with a meal.  60 tablet  1  . traMADol (ULTRAM) 50 MG tablet Take 1 tablet (50 mg total) by  mouth 2 (two) times daily as needed for pain.  60 tablet  1   No current facility-administered medications on file prior to visit.    No Known Allergies  Review of Systems  Review of Systems  Constitutional: Negative for fever and malaise/fatigue.  HENT: Negative for congestion.   Eyes: Negative for discharge.  Respiratory: Negative for shortness of breath.   Cardiovascular: Negative for chest pain, palpitations and leg swelling.  Gastrointestinal: Negative for nausea, abdominal pain and diarrhea.  Genitourinary: Negative for dysuria.  Musculoskeletal: Negative for falls.  Skin: Negative for rash.  Neurological: Negative for loss of consciousness and headaches.  Endo/Heme/Allergies: Negative for polydipsia.  Psychiatric/Behavioral: Negative for depression and suicidal ideas. The patient is not nervous/anxious and does not have insomnia.     Objective  BP 142/82  Pulse 65  Temp(Src) 98.5 F (36.9  C) (Oral)  Ht 6' (1.829 m)  Wt 169 lb 1.9 oz (76.712 kg)  BMI 22.93 kg/m2  SpO2 99%  Physical Exam  Physical Exam  Constitutional: He is oriented to person, place, and time and well-developed, well-nourished, and in no distress. No distress.  HENT:  Head: Normocephalic and atraumatic.  Eyes: Conjunctivae are normal.  Neck: Neck supple. No thyromegaly present.  Cardiovascular: Normal rate, regular rhythm and normal heart sounds.   No murmur heard. Pulmonary/Chest: Effort normal and breath sounds normal. No respiratory distress.  Abdominal: He exhibits no distension and no mass. There is no tenderness.  Musculoskeletal: He exhibits no edema.  Neurological: He is alert and oriented to person, place, and time.  Skin: Skin is warm.  Psychiatric: Memory, affect and judgment normal.    Lab Results  Component Value Date   TSH 1.100 05/13/2013   Lab Results  Component Value Date   WBC 4.5 05/13/2013   HGB 15.5 05/13/2013   HCT 45.0 05/13/2013   MCV 88.4 05/13/2013   PLT 147* 05/13/2013   Lab Results  Component Value Date   CREATININE 1.21 05/13/2013   BUN 15 05/13/2013   NA 138 05/13/2013   K 4.5 05/13/2013   CL 99 05/13/2013   CO2 31 05/13/2013   Lab Results  Component Value Date   ALT 56* 05/13/2013   AST 50* 05/13/2013   ALKPHOS 60 05/13/2013   BILITOT 1.0 05/13/2013   Lab Results  Component Value Date   CHOL 133 05/13/2013   Lab Results  Component Value Date   HDL 56 05/13/2013   Lab Results  Component Value Date   LDLCALC 62 05/13/2013   Lab Results  Component Value Date   TRIG 74 05/13/2013   Lab Results  Component Value Date   CHOLHDL 2.4 05/13/2013     Assessment & Plan  Elevated BP Well controlled, no changes to meds. Encouraged heart healthy diet such as the DASH diet and exercise as tolerated.   Decreased platelet count stable  HEPATITIS C, CHRONIC asymptomatic  Hyperglycemia  minimize simple carbs. Increase exercise as tolerated

## 2013-09-12 NOTE — Assessment & Plan Note (Signed)
minimize simple carbs. Increase exercise as tolerated.  

## 2013-11-04 IMAGING — US US BIOPSY
1 series · 8 of 8 positions shown · non-contrast
Comparison: none

ULTRASOUND CORE BIOPSY

Date: July 04, 2012
CLINICAL HISTORY: 61-year-old male with a past medical history of
IV drug use and hepatitis C.  Additionally, he has a 2-month
history of nodular mass in the superficial soft tissues of the
medial left upper arm.  During the consent process, he recalled
that he was bit and scratched by a kitten prior to the development
of this lump.  He states that he forgot to mention this on his
previous Dr. visits.  He presents for ultrasound guided biopsy

[Series 1: us biopsy · 0.06mm/px · 8 acquisitions, 8 frames shown]
[im 1/8]
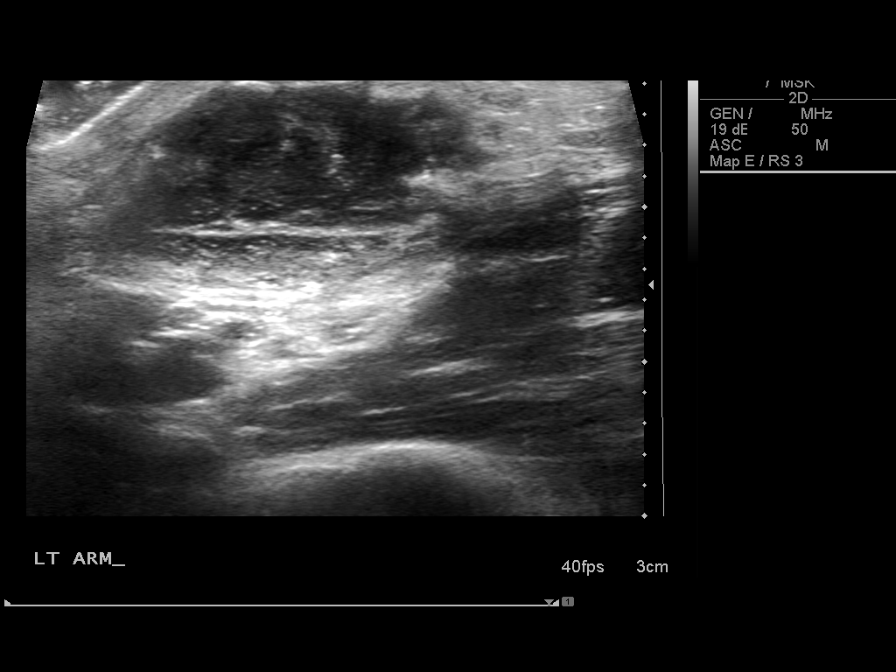
[im 2/8]
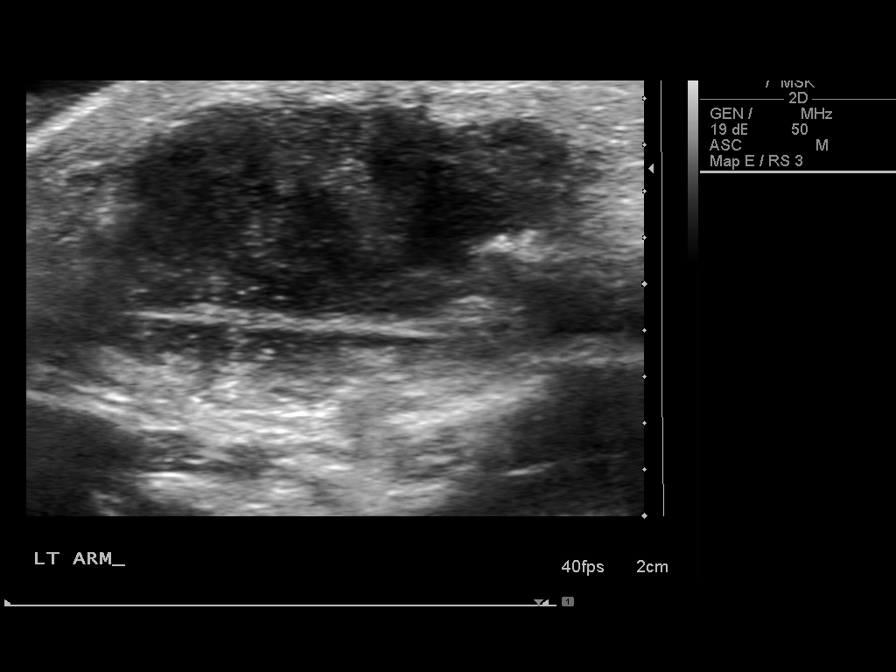
[im 3/8]
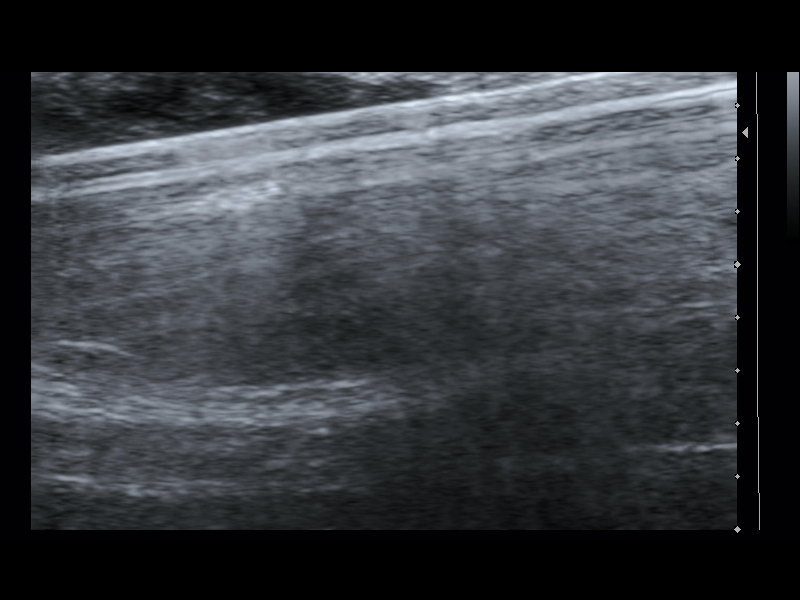
[im 4/8]
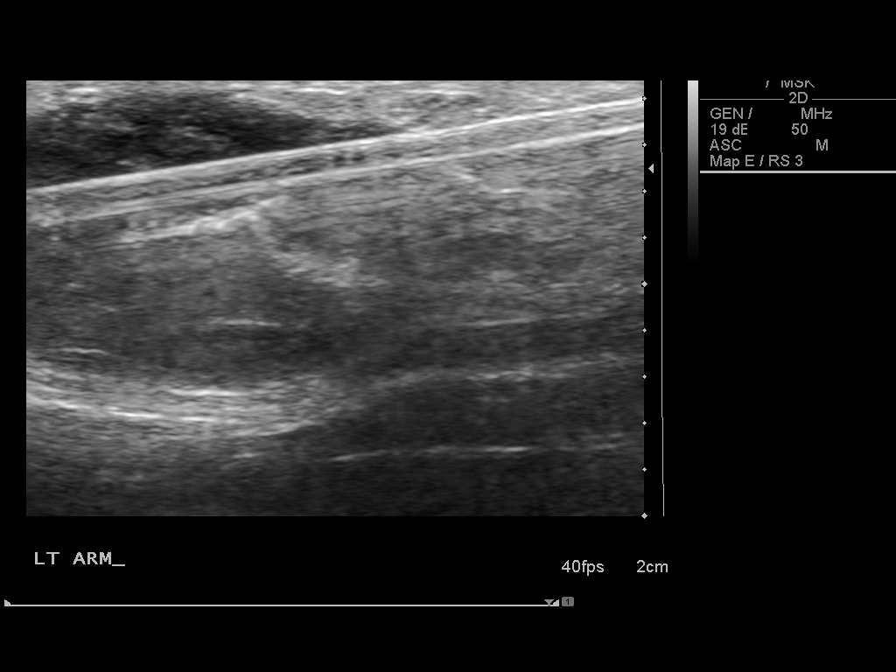
[im 5/8]
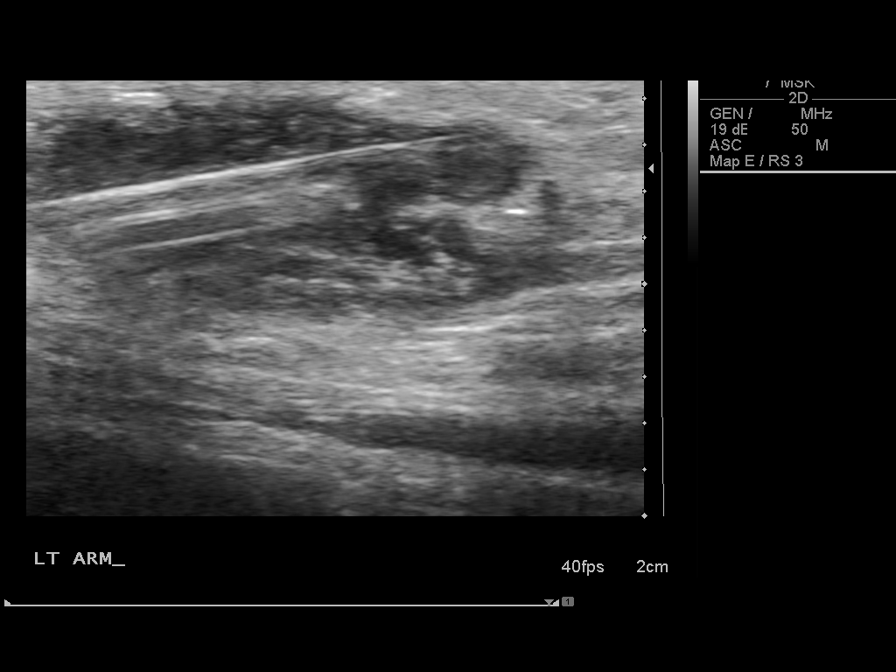
[im 6/8]
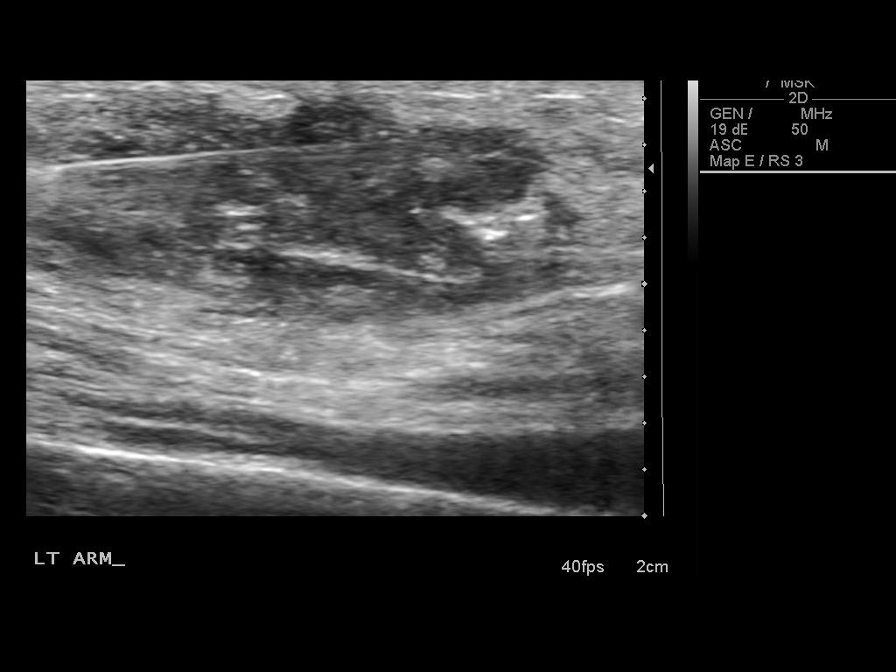
[im 7/8]
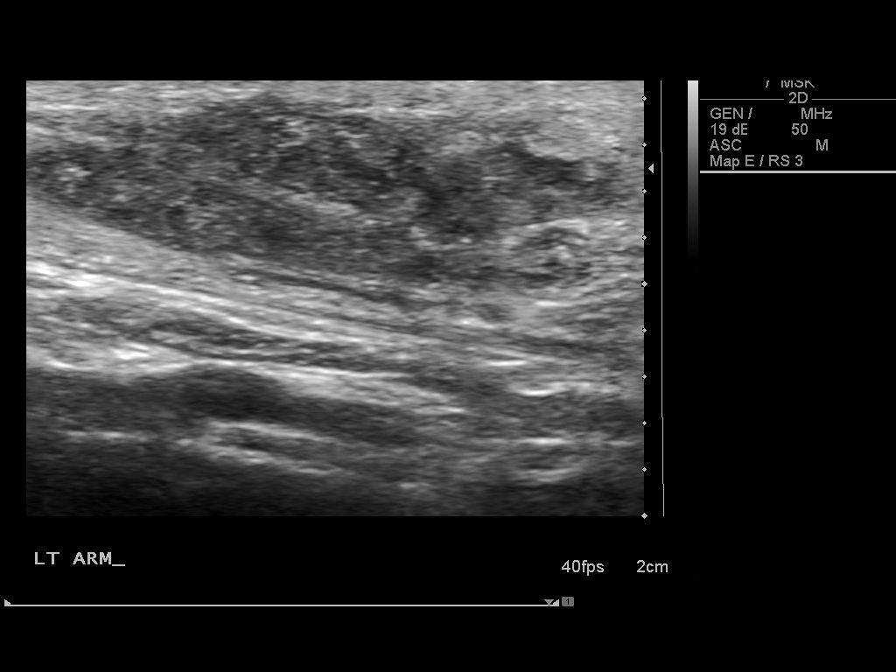
[im 8/8]
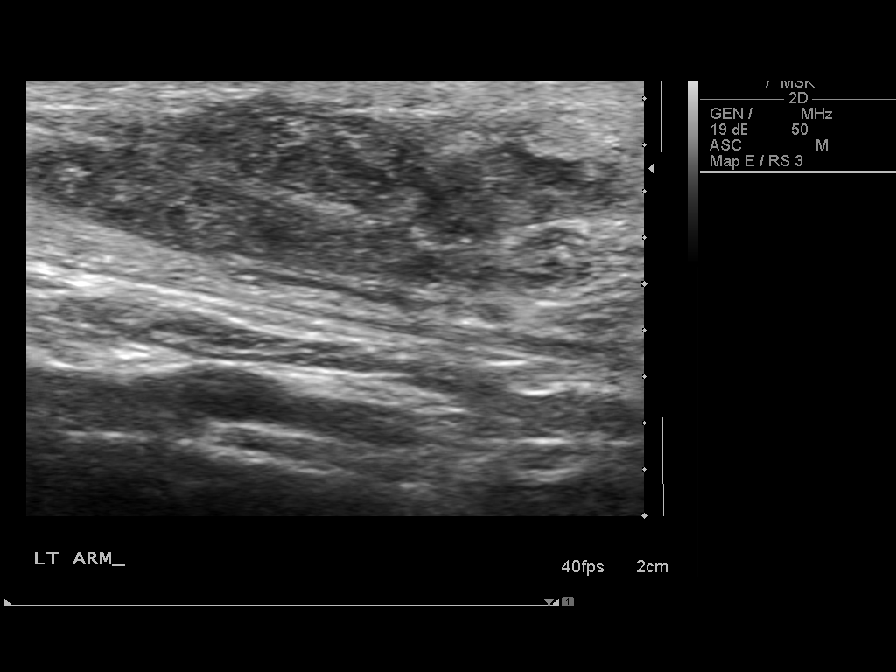

[8 of 8 positions shown; findings below may reference images not displayed]

Procedures Performed:
1. Ultrasound-guided core biopsy

Sedation: Conscious sedation was not used

PROCEDURE/FINDINGS:

 Informed consent was obtained from the patient following
explanation of the procedure, risks, benefits and alternatives.
The patient understands, agrees and consents for the procedure.
All questions were addressed. A time out was performed.

Maximal barrier sterile technique utilized including caps, mask,
sterile gowns, sterile gloves, large sterile drape, hand hygiene,
and betadine skin prep.

The left upper extremity was interrogated with ultrasound.  An
irregular hypoechoic mass was successfully identified.  Local
anesthesia was achieved by infiltration 1% lidocaine.  A small
dermatotomy was made in the skin with #11 blade.  Using a 18 gauge
Biopince automated biopsy device, several core biopsies were
obtained.  After the second core biopsy I note is there was some
purulent appearing liquid draining from the site.  Therefore, I
swabbed and culture this and attempted to palpating express all of
the residual fluid.  Additional core biopsies were then obtained
for both culture and cytology.

The patient tolerated the procedure well, there is no immediate
complication.
IMPRESSION: Technically successful ultrasound guided core biopsy of superficial
nodule in the medial left upper extremity. Of note, at least a
portion of the mass consisted of complex fluid which was
successfully expelled, and sent for culture.  Additionally, just
today he recalled an event where he was bit and scratched by a
kitten prior to the onset of this nodule.

I am suspicious for possible Bartonella (cat scratch
lymphadenopathy) or potentially an atypical mycobacterial
infection.  If this is a neoplastic process, it is highly necrotic.

Samples were sent for cytology as well as bacterial, fungal and
mycobacterial culture.

[REDACTED]

## 2013-12-22 ENCOUNTER — Telehealth: Payer: Self-pay | Admitting: Family Medicine

## 2013-12-22 DIAGNOSIS — Z8719 Personal history of other diseases of the digestive system: Secondary | ICD-10-CM

## 2013-12-22 MED ORDER — MAGIC MOUTHWASH W/LIDOCAINE: 10.0000 mL | Freq: Three times a day (TID) | ORAL | Status: AC | PRN

## 2013-12-22 NOTE — Telephone Encounter (Signed)
Refill sent.  Additional refills will have to come from Dr. Charlett Blake.

## 2013-12-22 NOTE — Telephone Encounter (Signed)
Refill- magic mouthwash  [facility]

## 2014-01-10 IMAGING — US US ABDOMEN COMPLETE
1 series · 14 of 25 positions shown · non-contrast
Comparison: Abdominal ultrasound 09/28/2009 and MRI abdomen
04/16/2010.

CLINICAL DATA: History of hepatitis C.

COMPLETE ABDOMINAL ULTRASOUND

[Series 1: us abdomen complete · 0.30mm/px · 14 of 90 slices shown]
[im 1/90]
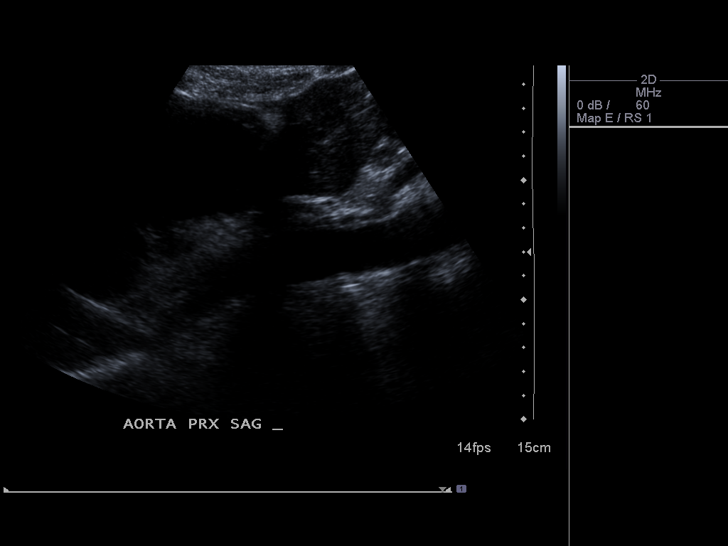
[im 8/90]
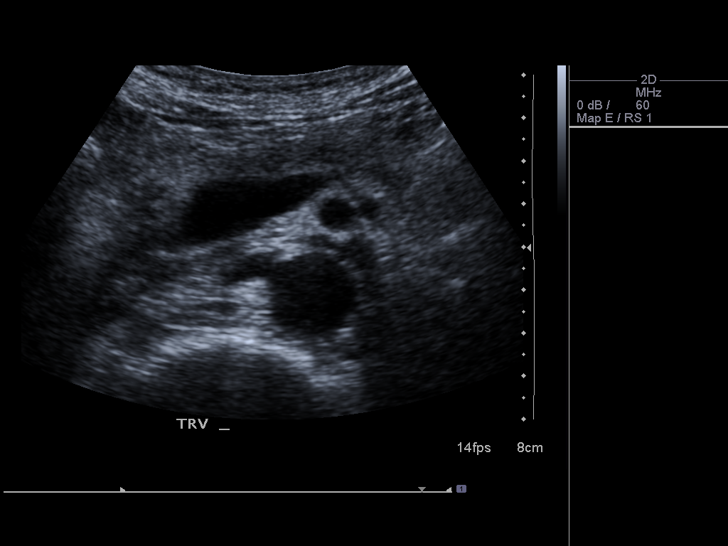
[im 15/90]
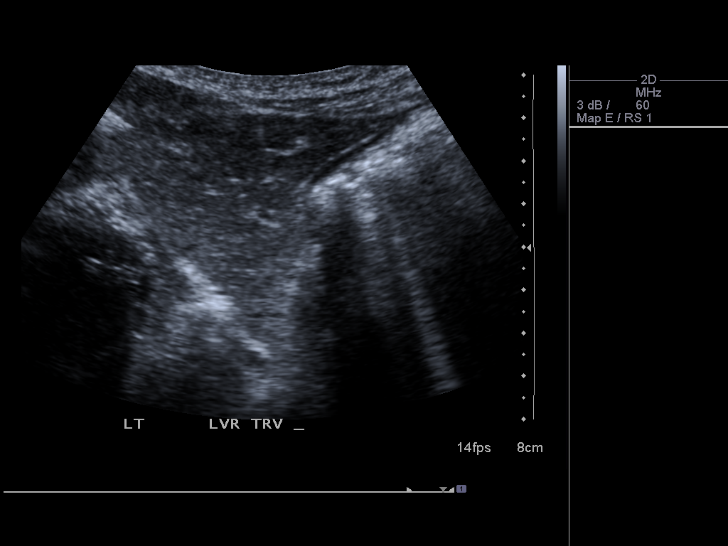
[im 23/90]
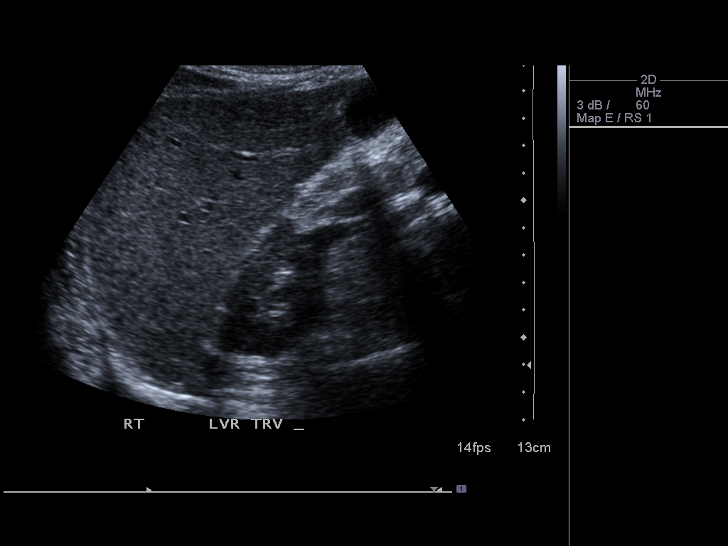
[im 30/90]
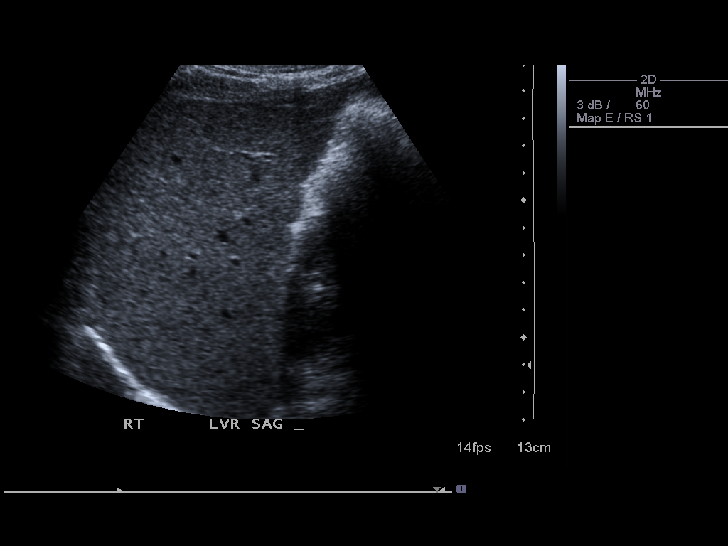
[im 34/90]
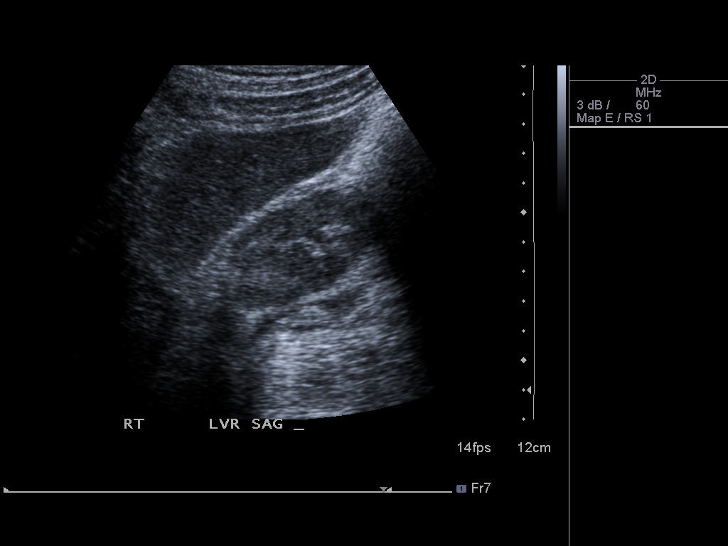
[im 41/90]
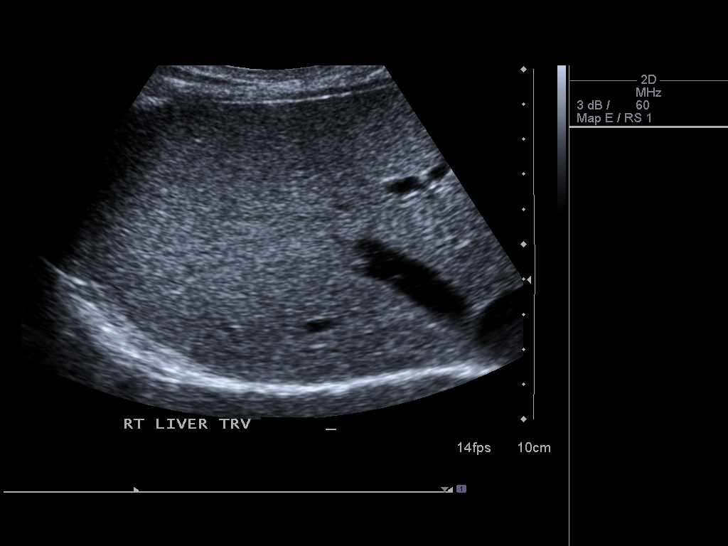
[im 49/90]
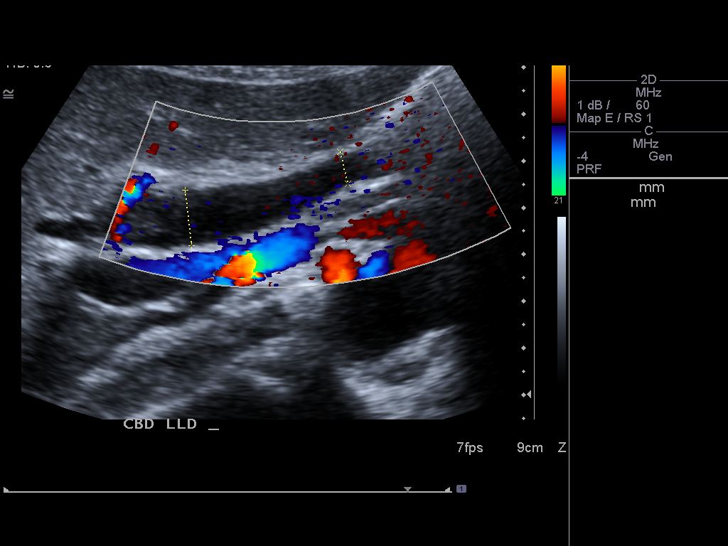
[im 56/90]
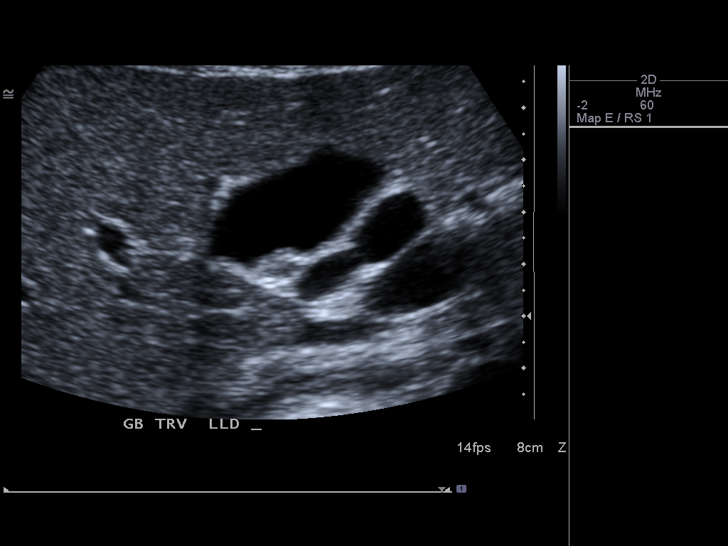
[im 60/90]
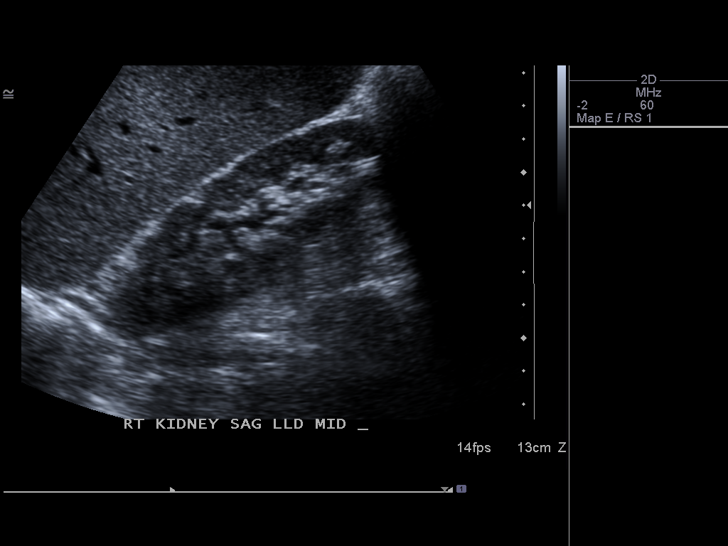
[im 67/90]
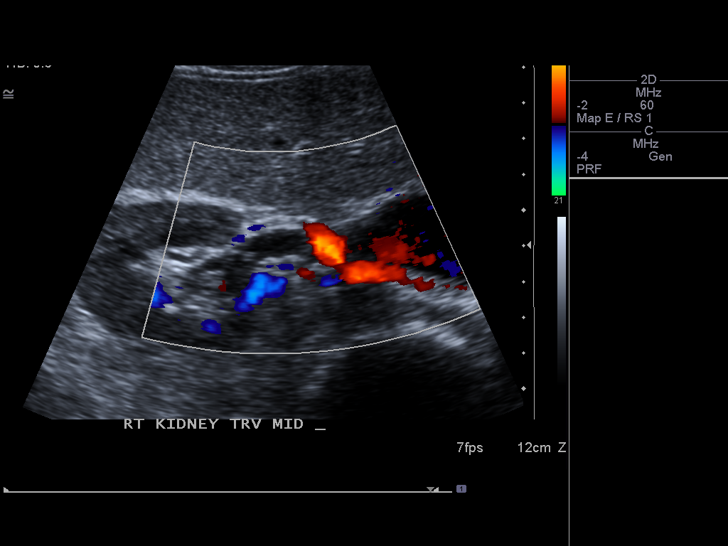
[im 75/90]
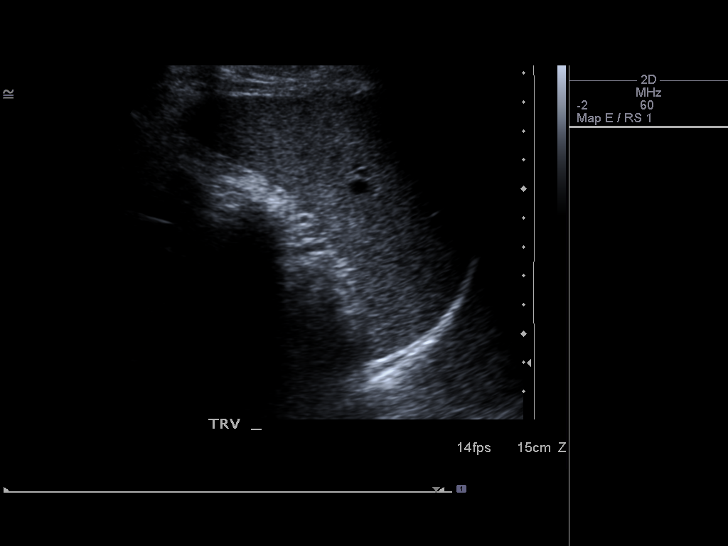
[im 82/90]
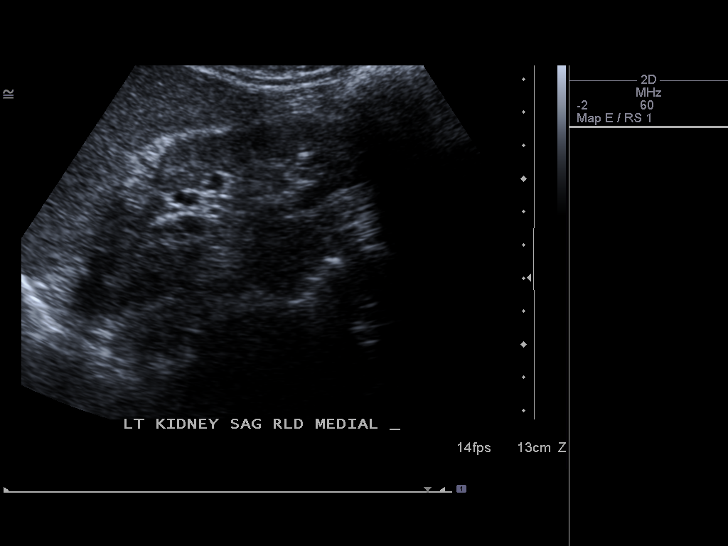
[im 90/90]
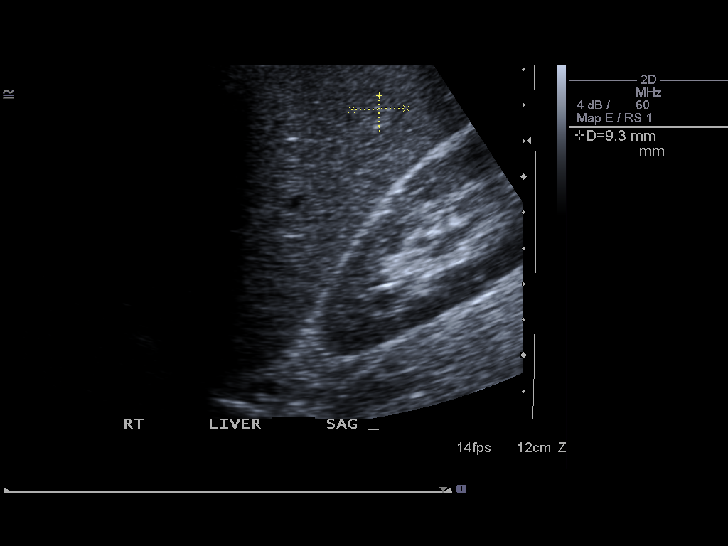

[14 of 25 positions shown; findings below may reference images not displayed]

FINDINGS: Gallbladder:  No gallstones, gallbladder wall thickening, or
pericholecystic fluid.

Common bile duct:  Mildly dilated at 1.6 cm but is not notably
changed.

Liver:  No focal lesion identified.  Within normal limits in
parenchymal echogenicity.

IVC:  Hemangioma in the right hepatic lobe measuring 1.5 cm,
unchanged.  No new liver lesion is identified.  No intrahepatic
biliary ductal dilatation.

Pancreas:  No focal abnormality seen.

Spleen:  Measures 12.3 cm and appears normal.

Right Kidney:  Measures 10.1 cm and appears normal.

Left Kidney:  Measures 10.3 cm and appears normal.

Abdominal aorta:  No aneurysm identified.
IMPRESSION: 1.  Hepatic hemangioma again seen.  No new or worrisome marrow
lesion identified.
2.  Unchanged dilatation of the common bile duct without
intrahepatic biliary ductal dilatation.

## 2014-01-13 LAB — TSH: TSH: 1.485 u[IU]/mL (ref 0.350–4.500)

## 2014-01-13 LAB — LIPID PANEL
CHOL/HDL RATIO: 2.1 ratio
CHOLESTEROL: 110 mg/dL (ref 0–200)
HDL: 53 mg/dL (ref >39)
LDL Cholesterol: 46 mg/dL (ref 0–99)
Triglycerides: 53 mg/dL (ref <150)
VLDL: 11 mg/dL (ref 0–40)

## 2014-01-13 LAB — HEPATIC FUNCTION PANEL
ALT: 53 U/L (ref 0–53)
AST: 47 U/L — ABNORMAL HIGH (ref 0–37)
Albumin: 4.4 g/dL (ref 3.5–5.2)
Alkaline Phosphatase: 53 U/L (ref 39–117)
Bilirubin, Direct: 0.2 mg/dL (ref 0.0–0.3)
Indirect Bilirubin: 0.8 mg/dL (ref 0.2–1.2)
TOTAL PROTEIN: 6.7 g/dL (ref 6.0–8.3)
Total Bilirubin: 1 mg/dL (ref 0.2–1.2)

## 2014-01-13 LAB — CBC
HCT: 43.4 % (ref 39.0–52.0)
Hemoglobin: 15.5 g/dL (ref 13.0–17.0)
MCH: 30.7 pg (ref 26.0–34.0)
MCHC: 35.7 g/dL (ref 30.0–36.0)
MCV: 85.9 fL (ref 78.0–100.0)
PLATELETS: 143 10*3/uL — AB (ref 150–400)
RBC: 5.05 MIL/uL (ref 4.22–5.81)
RDW: 13.5 % (ref 11.5–15.5)
WBC: 4.4 10*3/uL (ref 4.0–10.5)

## 2014-01-13 LAB — RENAL FUNCTION PANEL
ALBUMIN: 4.4 g/dL (ref 3.5–5.2)
BUN: 16 mg/dL (ref 6–23)
CALCIUM: 9.3 mg/dL (ref 8.4–10.5)
CO2: 26 mEq/L (ref 19–32)
Chloride: 100 mEq/L (ref 96–112)
Creat: 1.2 mg/dL (ref 0.50–1.35)
GLUCOSE: 89 mg/dL (ref 70–99)
Phosphorus: 3.1 mg/dL (ref 2.3–4.6)
Potassium: 4.2 mEq/L (ref 3.5–5.3)
Sodium: 136 mEq/L (ref 135–145)

## 2014-01-13 LAB — HEMOGLOBIN A1C
Hgb A1c MFr Bld: 5.7 % — ABNORMAL HIGH (ref <5.7)
MEAN PLASMA GLUCOSE: 117 mg/dL — AB (ref <117)

## 2014-01-20 ENCOUNTER — Ambulatory Visit (INDEPENDENT_AMBULATORY_CARE_PROVIDER_SITE_OTHER): Payer: BC Managed Care – PPO | Admitting: Family Medicine

## 2014-01-20 ENCOUNTER — Encounter: Payer: Self-pay | Admitting: Family Medicine

## 2014-01-20 VITALS — BP 130/94 | HR 63 | Temp 98.2°F | Ht 72.0 in | Wt 168.1 lb

## 2014-01-20 DIAGNOSIS — R739 Hyperglycemia, unspecified: Secondary | ICD-10-CM

## 2014-01-20 DIAGNOSIS — M25512 Pain in left shoulder: Secondary | ICD-10-CM

## 2014-01-20 DIAGNOSIS — M25519 Pain in unspecified shoulder: Secondary | ICD-10-CM

## 2014-01-20 DIAGNOSIS — IMO0001 Reserved for inherently not codable concepts without codable children: Secondary | ICD-10-CM

## 2014-01-20 DIAGNOSIS — R7309 Other abnormal glucose: Secondary | ICD-10-CM

## 2014-01-20 DIAGNOSIS — B182 Chronic viral hepatitis C: Secondary | ICD-10-CM

## 2014-01-20 DIAGNOSIS — R030 Elevated blood-pressure reading, without diagnosis of hypertension: Secondary | ICD-10-CM

## 2014-01-20 DIAGNOSIS — D696 Thrombocytopenia, unspecified: Secondary | ICD-10-CM

## 2014-01-20 MED ORDER — TRAMADOL HCL 50 MG PO TABS: 50.0000 mg | ORAL_TABLET | Freq: Two times a day (BID) | ORAL | Status: DC | PRN

## 2014-01-20 MED ORDER — CYCLOBENZAPRINE HCL 10 MG PO TABS: 10.0000 mg | ORAL_TABLET | Freq: Two times a day (BID) | ORAL | Status: DC | PRN

## 2014-01-20 NOTE — Progress Notes (Signed)
Pre visit review using our clinic review tool, if applicable. No additional management support is needed unless otherwise documented below in the visit note. 

## 2014-01-20 NOTE — Patient Instructions (Addendum)
Salon Pas patches or gel or Aspercreme Tylenol/Acetaminophen ES 500 mg twice daily and Naproxen  Call for referral if worse  PSA with next labs   Osteoarthritis Osteoarthritis is a disease that causes soreness and inflammation of a joint. It occurs when the cartilage at the affected joint wears down. Cartilage acts as a cushion, covering the ends of bones where they meet to form a joint. Osteoarthritis is the most common form of arthritis. It often occurs in older people. The joints affected most often by this condition include those in the:  Ends of the fingers.  Thumbs.  Neck.  Lower back.  Knees.  Hips. CAUSES  Over time, the cartilage that covers the ends of bones begins to wear away. This causes bone to rub on bone, producing pain and stiffness in the affected joints.  RISK FACTORS Certain factors can increase your chances of having osteoarthritis, including:  Older age.  Excessive body weight.  Overuse of joints.  Previous joint injury. SIGNS AND SYMPTOMS   Pain, swelling, and stiffness in the joint.  Over time, the joint may lose its normal shape.  Small deposits of bone (osteophytes) may grow on the edges of the joint.  Bits of bone or cartilage can break off and float inside the joint space. This may cause more pain and damage. DIAGNOSIS  Your health care provider will do a physical exam and ask about your symptoms. Various tests may be ordered, such as:  X-rays of the affected joint.  An MRI scan.  Blood tests to rule out other types of arthritis.  Joint fluid tests. This involves using a needle to draw fluid from the joint and examining the fluid under a microscope. TREATMENT  Goals of treatment are to control pain and improve joint function. Treatment plans may include:  A prescribed exercise program that allows for rest and joint relief.  A weight control plan.  Pain relief techniques, such as:  Properly applied heat and cold.  Electric  pulses delivered to nerve endings under the skin (transcutaneous electrical nerve stimulation [TENS]).  Massage.  Certain nutritional supplements.  Medicines to control pain, such as:  Acetaminophen.  Nonsteroidal anti-inflammatory drugs (NSAIDs), such as naproxen.  Narcotic or central-acting agents, such as tramadol.  Corticosteroids. These can be given orally or as an injection.  Surgery to reposition the bones and relieve pain (osteotomy) or to remove loose pieces of bone and cartilage. Joint replacement may be needed in advanced states of osteoarthritis. HOME CARE INSTRUCTIONS   Take medicines only as directed by your health care provider.  Maintain a healthy weight. Follow your health care provider's instructions for weight control. This may include dietary instructions.  Exercise as directed. Your health care provider can recommend specific types of exercise. These may include:  Strengthening exercises. These are done to strengthen the muscles that support joints affected by arthritis. They can be performed with weights or with exercise bands to add resistance.  Aerobic activities. These are exercises, such as brisk walking or low-impact aerobics, that get your heart pumping.  Range-of-motion activities. These keep your joints limber.  Balance and agility exercises. These help you maintain daily living skills.  Rest your affected joints as directed by your health care provider.  Keep all follow-up visits as directed by your health care provider. SEEK MEDICAL CARE IF:   Your skin turns red.  You develop a rash in addition to your joint pain.  You have worsening joint pain.  You have a fever  along with joint or muscle aches. SEEK IMMEDIATE MEDICAL CARE IF:  You have a significant loss of weight or appetite.  You have night sweats. [place] of Arthritis and Musculoskeletal and Skin Diseases: www.niams.SouthExposed.es  [employer] on Aging: http://kim-miller.com/  American College of Rheumatology: www.rheumatology.org Document Released: 05/29/2005 Document Revised: 10/13/2013 Document Reviewed: 02/03/2013 [facility] Patient Information 2015 [place], [place]. This information is not intended to replace advice given to you by your health care provider. Make sure you discuss any questions you have with your health care provider.

## 2014-01-25 ENCOUNTER — Encounter: Payer: Self-pay | Admitting: Family Medicine

## 2014-01-25 NOTE — Assessment & Plan Note (Signed)
Encouraged heart healthy diet such as the DASH diet and exercise as tolerated.  

## 2014-01-25 NOTE — Assessment & Plan Note (Signed)
Stable and asymptomatic  

## 2014-01-25 NOTE — Assessment & Plan Note (Signed)
Mild LFT elevation, actually improved

## 2014-01-25 NOTE — Progress Notes (Signed)
Patient ID: Shaun [name], male   DOB: 08/08/1950, 63 y.o.   MRN: [id number] Shaun [name] [id number] 1951/05/28 01/25/2014      Progress Note-Follow Up  Subjective  Chief Complaint  Chief Complaint  Patient presents with  . Follow-up    4 month    HPI  Patient is a 63 year old male in today for routine medical care. No recent illness. He is generally doing well. Continues to struggle with pain and decreased range of motion in his shoulders but no recent illness or injury. Naproxen is helpful temporarily. He has had cortisone shots which have also been helpful. Denies CP/palp/SOB/HA/congestion/fevers/GI or GU c/o. Taking meds as prescribed  Past Medical History  Diagnosis Date  . Hepatitis C, chronic   . History of intravenous drug use in remission   . Arm mass   . Arm skin lesion, left 11/12/2012    And sun damaged skin  . Elevated BP 11/14/2012  . Left shoulder pain 12/16/2012  . Preventative health care 05/14/2013  . History of oral lesions 05/14/2013    Past Surgical History  Procedure Laterality Date  . Knee arthroscopy  1996    ligment repair    Family History  Problem Relation Age of Onset  . Heart disease Father   . Diabetes Father   . Hyperlipidemia Father   . Hypertension Father   . Cancer Neg Hx     negative for colon and prostate  . Thyroid disease Sister   . Heart disease Cousin     MI  . Diabetes Cousin   . Obesity Cousin     History   Social History  . Marital Status: Married    Spouse Name: N/A    Number of Children: N/A  . Years of Education: N/A   Occupational History  . Not on file.   Social History Main Topics  . Smoking status: Never Smoker   . Smokeless tobacco: Never Used  . Alcohol Use: No  . Drug Use: Not on file  . Sexual Activity: Not on file     Comment: avoids nuts, fruits, lives with wife   Other Topics Concern  . Not on file   Social History Narrative  . No narrative on file    Current Outpatient Prescriptions  on File Prior to Visit  Medication Sig Dispense Refill  . Alum & Mag Hydroxide-Simeth (MAGIC MOUTHWASH W/LIDOCAINE) SOLN Take 10 mLs by mouth 3 (three) times daily as needed. Swish and spit.  240 mL  0  . fluocinonide gel (LIDEX) 2.69 % Apply 1 application topically 2 (two) times daily as needed. Applies to tongue as needed  60 g  3  . glucosamine-chondroitin 500-400 MG tablet Take 1 tablet by mouth every morning.       . methadone (DOLOPHINE) 10 MG/5ML solution Take 12 mg by mouth every morning.       . Multiple Vitamin (MULTIVITAMIN) tablet Take 1 tablet by mouth every morning.       . naproxen (NAPROSYN) 500 MG tablet Take 1 tablet (500 mg total) by mouth 2 (two) times daily with a meal.  60 tablet  1   No current facility-administered medications on file prior to visit.    No Known Allergies  Review of Systems  Review of Systems  Constitutional: Negative for fever and malaise/fatigue.  HENT: Negative for congestion.   Eyes: Negative for discharge.  Respiratory: Negative for shortness of breath.   Cardiovascular: Negative for chest pain, palpitations and  leg swelling.  Gastrointestinal: Negative for nausea, abdominal pain and diarrhea.  Genitourinary: Negative for dysuria.  Musculoskeletal: Positive for joint pain. Negative for falls.  Skin: Negative for rash.  Neurological: Negative for dizziness, loss of consciousness, weakness and headaches.  Endo/Heme/Allergies: Negative for polydipsia.  Psychiatric/Behavioral: Negative for depression and suicidal ideas. The patient is not nervous/anxious and does not have insomnia.     Objective  BP 130/94  Pulse 63  Temp(Src) 98.2 F (36.8 C) (Oral)  Ht 6' (1.829 m)  Wt 168 lb 1.3 oz (76.241 kg)  BMI 22.79 kg/m2  SpO2 97%  Physical Exam  Physical Exam  Constitutional: He is oriented to person, place, and time and well-developed, well-nourished, and in no distress. No distress.  HENT:  Head: Normocephalic and atraumatic.  Eyes:  Conjunctivae are normal.  Neck: Neck supple. No thyromegaly present.  Cardiovascular: Normal rate, regular rhythm and normal heart sounds.   No murmur heard. Pulmonary/Chest: Effort normal and breath sounds normal. No respiratory distress.  Abdominal: He exhibits no distension and no mass. There is no tenderness.  Musculoskeletal: He exhibits no edema.  Neurological: He is alert and oriented to person, place, and time.  Skin: Skin is warm.  Psychiatric: Memory, affect and judgment normal.    Lab Results  Component Value Date   TSH 1.485 01/13/2014   Lab Results  Component Value Date   WBC 4.4 01/13/2014   HGB 15.5 01/13/2014   HCT 43.4 01/13/2014   MCV 85.9 01/13/2014   PLT 143* 01/13/2014   Lab Results  Component Value Date   CREATININE 1.20 01/13/2014   BUN 16 01/13/2014   NA 136 01/13/2014   K 4.2 01/13/2014   CL 100 01/13/2014   CO2 26 01/13/2014   Lab Results  Component Value Date   ALT 53 01/13/2014   AST 47* 01/13/2014   ALKPHOS 53 01/13/2014   BILITOT 1.0 01/13/2014   Lab Results  Component Value Date   CHOL 110 01/13/2014   Lab Results  Component Value Date   HDL 53 01/13/2014   Lab Results  Component Value Date   LDLCALC 46 01/13/2014   Lab Results  Component Value Date   TRIG 53 01/13/2014   Lab Results  Component Value Date   CHOLHDL 2.1 01/13/2014     Assessment & Plan  Left shoulder pain Intermittent trouble with decreased ROM responds to Naproxen temporarily, may try Flexeril qhs and if symptoms worsen may need referral.   Elevated BP  Encouraged heart healthy diet such as the DASH diet and exercise as tolerated.   Decreased platelet count Stable and asymptomatic  Hyperglycemia hgba1c acceptable, minimize simple carbs. Increase exercise as tolerated.  HEPATITIS C, CHRONIC Mild LFT elevation, actually improved

## 2014-01-25 NOTE — Assessment & Plan Note (Signed)
Intermittent trouble with decreased ROM responds to Naproxen temporarily, may try Flexeril qhs and if symptoms worsen may need referral.

## 2014-01-25 NOTE — Assessment & Plan Note (Signed)
hgba1c acceptable, minimize simple carbs. Increase exercise as tolerated.  

## 2014-06-15 ENCOUNTER — Telehealth: Payer: Self-pay | Admitting: *Deleted

## 2014-06-15 DIAGNOSIS — Z8719 Personal history of other diseases of the digestive system: Secondary | ICD-10-CM

## 2014-06-15 NOTE — Telephone Encounter (Signed)
OK to RF Fluocinonide, same strength, sig and amount with 1 rf

## 2014-06-15 NOTE — Telephone Encounter (Signed)
Requesting Fluocinonide 0.05% Gel-Apply to affected area two times daily as needed.  May apply to tongue as needed. Last refill:12-22-13;30GM,3 Last OV:01/20/14 Please advise.//AB/CMA

## 2014-06-16 MED ORDER — FLUOCINONIDE 0.05 % EX GEL: 1.0000 "application " | Freq: Two times a day (BID) | CUTANEOUS | Status: DC | PRN

## 2014-06-16 NOTE — Telephone Encounter (Signed)
Rx sent to the pharmacy by e-script.//AB/CMA 

## 2014-07-21 ENCOUNTER — Encounter: Payer: Self-pay | Admitting: Family Medicine

## 2014-07-21 ENCOUNTER — Ambulatory Visit (INDEPENDENT_AMBULATORY_CARE_PROVIDER_SITE_OTHER): Payer: BLUE CROSS/BLUE SHIELD | Admitting: Family Medicine

## 2014-07-21 VITALS — BP 143/78 | HR 94 | Temp 98.4°F | Ht 72.0 in | Wt 172.5 lb

## 2014-07-21 DIAGNOSIS — R030 Elevated blood-pressure reading, without diagnosis of hypertension: Secondary | ICD-10-CM

## 2014-07-21 DIAGNOSIS — Z23 Encounter for immunization: Secondary | ICD-10-CM

## 2014-07-21 DIAGNOSIS — Z8719 Personal history of other diseases of the digestive system: Secondary | ICD-10-CM

## 2014-07-21 DIAGNOSIS — D696 Thrombocytopenia, unspecified: Secondary | ICD-10-CM

## 2014-07-21 DIAGNOSIS — R3911 Hesitancy of micturition: Secondary | ICD-10-CM

## 2014-07-21 DIAGNOSIS — Z0000 Encounter for general adult medical examination without abnormal findings: Secondary | ICD-10-CM

## 2014-07-21 DIAGNOSIS — R7309 Other abnormal glucose: Secondary | ICD-10-CM

## 2014-07-21 DIAGNOSIS — IMO0001 Reserved for inherently not codable concepts without codable children: Secondary | ICD-10-CM

## 2014-07-21 DIAGNOSIS — R739 Hyperglycemia, unspecified: Secondary | ICD-10-CM

## 2014-07-21 LAB — COMPREHENSIVE METABOLIC PANEL
ALK PHOS: 58 U/L (ref 39–117)
ALT: 56 U/L — ABNORMAL HIGH (ref 0–53)
AST: 48 U/L — AB (ref 0–37)
Albumin: 4.6 g/dL (ref 3.5–5.2)
BILIRUBIN TOTAL: 0.8 mg/dL (ref 0.2–1.2)
BUN: 18 mg/dL (ref 6–23)
CO2: 31 mEq/L (ref 19–32)
CREATININE: 1.2 mg/dL (ref 0.40–1.50)
Calcium: 9.6 mg/dL (ref 8.4–10.5)
Chloride: 101 mEq/L (ref 96–112)
GFR: 64.61 mL/min (ref >60.00)
Glucose, Bld: 97 mg/dL (ref 70–99)
Potassium: 4.5 mEq/L (ref 3.5–5.1)
Sodium: 140 mEq/L (ref 135–145)
Total Protein: 7.1 g/dL (ref 6.0–8.3)

## 2014-07-21 LAB — URINALYSIS
BILIRUBIN URINE: NEGATIVE
HGB URINE DIPSTICK: NEGATIVE
Ketones, ur: NEGATIVE
LEUKOCYTES UA: NEGATIVE
Nitrite: NEGATIVE
Specific Gravity, Urine: <=1.005 — AB (ref 1.000–1.030)
Total Protein, Urine: NEGATIVE
URINE GLUCOSE: NEGATIVE
Urobilinogen, UA: 0.2 (ref 0.0–1.0)
pH: 7 (ref 5.0–8.0)

## 2014-07-21 LAB — CBC WITH DIFFERENTIAL/PLATELET
BASOS PCT: 0.6 % (ref 0.0–3.0)
Basophils Absolute: 0 10*3/uL (ref 0.0–0.1)
EOS ABS: 0.1 10*3/uL (ref 0.0–0.7)
Eosinophils Relative: 1.4 % (ref 0.0–5.0)
HCT: 45.6 % (ref 39.0–52.0)
Hemoglobin: 15.7 g/dL (ref 13.0–17.0)
Lymphocytes Relative: 32.1 % (ref 12.0–46.0)
Lymphs Abs: 1.3 10*3/uL (ref 0.7–4.0)
MCHC: 34.5 g/dL (ref 30.0–36.0)
MCV: 89.6 fl (ref 78.0–100.0)
MONO ABS: 0.4 10*3/uL (ref 0.1–1.0)
Monocytes Relative: 8.9 % (ref 3.0–12.0)
NEUTROS PCT: 57 % (ref 43.0–77.0)
Neutro Abs: 2.3 10*3/uL (ref 1.4–7.7)
PLATELETS: 134 10*3/uL — AB (ref 150.0–400.0)
RBC: 5.09 Mil/uL (ref 4.22–5.81)
RDW: 12.8 % (ref 11.5–15.5)
WBC: 4 10*3/uL (ref 4.0–10.5)

## 2014-07-21 LAB — TSH: TSH: 1.28 u[IU]/mL (ref 0.35–4.50)

## 2014-07-21 LAB — PSA: PSA: 0.76 ng/mL (ref 0.10–4.00)

## 2014-07-21 LAB — HEMOGLOBIN A1C: HEMOGLOBIN A1C: 5.8 % (ref 4.6–6.5)

## 2014-07-21 NOTE — Progress Notes (Signed)
Shaun [name]  [id number] 27-Aug-1950 07/21/2014      Progress Note-Follow Up  Subjective  Chief Complaint  Chief Complaint  Patient presents with  . Annual Exam    HPI  Patient is a 64 y.o. male in today for routine medical care. Patient is in for follow-up. Complains of recurrent lesions in his mouth. Notes they're worse when he eats nuts tomatoes or throat so he does try and avoid them. He's not having a current flare but when he does his tongue is quite raw and Lidex is helpful. He would like a refill. He denies any other recent illness. Is trying to maintain a heart healthy diet.  Denies CP/palp/SOB/HA/congestion/fevers/GI or GU c/o. Taking meds as prescribed Past Medical History  Diagnosis Date  . Hepatitis C, chronic   . History of intravenous drug use in remission   . Arm mass   . Arm skin lesion, left 11/12/2012    And sun damaged skin  . Elevated BP 11/14/2012  . Left shoulder pain 12/16/2012  . Preventative health care 05/14/2013  . History of oral lesions 05/14/2013    Past Surgical History  Procedure Laterality Date  . Knee arthroscopy  1996    ligment repair    Family History  Problem Relation Age of Onset  . Heart disease Father   . Diabetes Father   . Hyperlipidemia Father   . Hypertension Father   . Cancer Neg Hx     negative for colon and prostate  . Thyroid disease Sister   . Heart disease Cousin     MI  . Diabetes Cousin   . Obesity Cousin     History   Social History  . Marital Status: Married    Spouse Name: N/A    Number of Children: N/A  . Years of Education: N/A   Occupational History  . Not on file.   Social History Main Topics  . Smoking status: Never Smoker   . Smokeless tobacco: Never Used  . Alcohol Use: No  . Drug Use: Not on file  . Sexual Activity: Not on file     Comment: avoids nuts, fruits, lives with wife   Other Topics Concern  . Not on file   Social History Narrative    Current Outpatient Prescriptions on  File Prior to Visit  Medication Sig Dispense Refill  . cyclobenzaprine (FLEXERIL) 10 MG tablet Take 1 tablet (10 mg total) by mouth 2 (two) times daily as needed for muscle spasms. 30 tablet 2  . fluocinonide gel (LIDEX) 9.56 % Apply 1 application topically 2 (two) times daily as needed. Applies to tongue as needed 60 g 1  . methadone (DOLOPHINE) 10 MG/5ML solution Take 12 mg by mouth every morning.     . Multiple Vitamin (MULTIVITAMIN) tablet Take 1 tablet by mouth every morning.     . naproxen (NAPROSYN) 500 MG tablet Take 1 tablet (500 mg total) by mouth 2 (two) times daily with a meal. 60 tablet 1  . Alum & Mag Hydroxide-Simeth (MAGIC MOUTHWASH W/LIDOCAINE) SOLN Take 10 mLs by mouth 3 (three) times daily as needed. Swish and spit. (Patient not taking: Reported on 07/21/2014) 240 mL 0  . glucosamine-chondroitin 500-400 MG tablet Take 1 tablet by mouth every morning.     . traMADol (ULTRAM) 50 MG tablet Take 1 tablet (50 mg total) by mouth 2 (two) times daily as needed. (Patient not taking: Reported on 07/21/2014) 60 tablet 0   No current facility-administered medications  on file prior to visit.    No Known Allergies  Review of Systems  Review of Systems  Constitutional: Negative for fever, chills and malaise/fatigue.  HENT: Negative for congestion, hearing loss and nosebleeds.   Eyes: Negative for discharge.  Respiratory: Negative for cough, sputum production, shortness of breath and wheezing.   Cardiovascular: Negative for chest pain, palpitations and leg swelling.  Gastrointestinal: Negative for heartburn, nausea, vomiting, abdominal pain, diarrhea, constipation and blood in stool.  Genitourinary: Negative for dysuria, urgency, frequency and hematuria.  Musculoskeletal: Negative for myalgias, back pain and falls.  Skin: Negative for rash.  Neurological: Negative for dizziness, tremors, sensory change, focal weakness, loss of consciousness, weakness and headaches.  Endo/Heme/Allergies:  Negative for polydipsia. Does not bruise/bleed easily.  Psychiatric/Behavioral: Negative for depression and suicidal ideas. The patient is not nervous/anxious and does not have insomnia.     Objective  BP 142/78 mmHg  Pulse 94  Temp(Src) 98.4 F (36.9 C) (Oral)  Ht 6' (1.829 m)  Wt 172 lb 8 oz (78.245 kg)  BMI 23.39 kg/m2  SpO2 98%  Physical Exam  Physical Exam  Constitutional: He is oriented to person, place, and time and well-developed, well-nourished, and in no distress. No distress.  HENT:  Head: Normocephalic and atraumatic.  Eyes: Conjunctivae are normal.  Neck: Neck supple. No thyromegaly present.  Cardiovascular: Normal rate, regular rhythm and normal heart sounds.   No murmur heard. Pulmonary/Chest: Effort normal and breath sounds normal. No respiratory distress.  Abdominal: He exhibits no distension and no mass. There is no tenderness.  Musculoskeletal: He exhibits no edema.  Neurological: He is alert and oriented to person, place, and time.  Skin: Skin is warm.  Psychiatric: Memory, affect and judgment normal.    Lab Results  Component Value Date   TSH 1.485 01/13/2014   Lab Results  Component Value Date   WBC 4.4 01/13/2014   HGB 15.5 01/13/2014   HCT 43.4 01/13/2014   MCV 85.9 01/13/2014   PLT 143* 01/13/2014   Lab Results  Component Value Date   CREATININE 1.20 01/13/2014   BUN 16 01/13/2014   NA 136 01/13/2014   K 4.2 01/13/2014   CL 100 01/13/2014   CO2 26 01/13/2014   Lab Results  Component Value Date   ALT 53 01/13/2014   AST 47* 01/13/2014   ALKPHOS 53 01/13/2014   BILITOT 1.0 01/13/2014   Lab Results  Component Value Date   CHOL 110 01/13/2014   Lab Results  Component Value Date   HDL 53 01/13/2014   Lab Results  Component Value Date   LDLCALC 46 01/13/2014   Lab Results  Component Value Date   TRIG 53 01/13/2014   Lab Results  Component Value Date   CHOLHDL 2.1 01/13/2014     Assessment & Plan  Elevated BP Well  controlled, no changes to meds. Encouraged heart healthy diet such as the DASH diet and exercise as tolerated.  Improved on recheck   Hyperglycemia hgba1c acceptable, minimize simple carbs. Increase exercise as tolerated.   History of oral lesions Recurrent, has responded to Lidex in past, given refill. Avoids nuts, tomatoes and fruits and that helps   Preventative health care Patient encouraged to maintain heart healthy. diet, regular exercise, adequate sleep. Consider daily probiotics. Take medications as prescribed. Labs reviewed. Had colonoscopy at age 64 and needs repeat at 54 unless symptoms develop. Given flu shot today   Decreased platelet count Mild, stable    Urinary hesitancy Urine  culture negative, patient will report if symptoms worsen

## 2014-07-21 NOTE — Patient Instructions (Addendum)
[employer] insurance  [place] patches or gel as needed  Preventive Care for Adults A healthy lifestyle and preventive care can promote health and wellness. Preventive health guidelines for men include the following key practices:  A routine yearly physical is a good way to check with your health care provider about your health and preventative screening. It is a chance to share any concerns and updates on your health and to receive a thorough exam.  Visit your dentist for a routine exam and preventative care every 6 months. Brush your teeth twice a day and floss once a day. Good oral hygiene prevents tooth decay and gum disease.  The frequency of eye exams is based on your age, health, family medical history, use of contact lenses, and other factors. Follow your health care provider's recommendations for frequency of eye exams.  Eat a healthy diet. Foods such as vegetables, fruits, whole grains, low-fat dairy products, and lean protein foods contain the nutrients you need without too many calories. Decrease your intake of foods high in solid fats, added sugars, and salt. Eat the right amount of calories for you.Get information about a proper diet from your health care provider, if necessary.  Regular physical exercise is one of the most important things you can do for your health. Most adults should get at least 150 minutes of moderate-intensity exercise (any activity that increases your heart rate and causes you to sweat) each week. In addition, most adults need muscle-strengthening exercises on 2 or more days a week.  Maintain a healthy weight. The body mass index (BMI) is a screening tool to identify possible weight problems. It provides an estimate of body fat based on height and weight. Your health care provider can find your BMI and can help you achieve or maintain a healthy weight.For adults 20 years and older:  A BMI below 18.5 is considered underweight.  A BMI of 18.5 to  24.9 is normal.  A BMI of 25 to 29.9 is considered overweight.  A BMI of 30 and above is considered obese.  Maintain normal blood lipids and cholesterol levels by exercising and minimizing your intake of saturated fat. Eat a balanced diet with plenty of fruit and vegetables. Blood tests for lipids and cholesterol should begin at age 17 and be repeated every 5 years. If your lipid or cholesterol levels are high, you are over 50, or you are at high risk for heart disease, you may need your cholesterol levels checked more frequently.Ongoing high lipid and cholesterol levels should be treated with medicines if diet and exercise are not working.  If you smoke, find out from your health care provider how to quit. If you do not use tobacco, do not start.  Lung cancer screening is recommended for adults aged 31-80 years who are at high risk for developing lung cancer because of a history of smoking. A yearly low-dose CT scan of the lungs is recommended for people who have at least a 30-pack-year history of smoking and are a current smoker or have quit within the past 15 years. A pack year of smoking is smoking an average of 1 pack of cigarettes a day for 1 year (for example: 1 pack a day for 30 years or 2 packs a day for 15 years). Yearly screening should continue until the smoker has stopped smoking for at least 15 years. Yearly screening should be stopped for people who develop a health problem that would prevent them from having lung cancer  treatment.  If you choose to drink alcohol, do not have more than 2 drinks per day. One drink is considered to be 12 ounces (355 mL) of beer, 5 ounces (148 mL) of wine, or 1.5 ounces (44 mL) of liquor.  Avoid use of street drugs. Do not share needles with anyone. Ask for help if you need support or instructions about stopping the use of drugs.  High blood pressure causes heart disease and increases the risk of stroke. Your blood pressure should be checked at least  every 1-2 years. Ongoing high blood pressure should be treated with medicines, if weight loss and exercise are not effective.  If you are 57-76 years old, ask your health care provider if you should take aspirin to prevent heart disease.  Diabetes screening involves taking a blood sample to check your fasting blood sugar level. This should be done once every 3 years, after age 69, if you are within normal weight and without risk factors for diabetes. Testing should be considered at a younger age or be carried out more frequently if you are overweight and have at least 1 risk factor for diabetes.  Colorectal cancer can be detected and often prevented. Most routine colorectal cancer screening begins at the age of 35 and continues through age 67. However, your health care provider may recommend screening at an earlier age if you have risk factors for colon cancer. On a yearly basis, your health care provider may provide home test kits to check for hidden blood in the stool. Use of a small camera at the end of a tube to directly examine the colon (sigmoidoscopy or colonoscopy) can detect the earliest forms of colorectal cancer. Talk to your health care provider about this at age 80, when routine screening begins. Direct exam of the colon should be repeated every 5-10 years through age 26, unless early forms of precancerous polyps or small growths are found.  People who are at an increased risk for hepatitis B should be screened for this virus. You are considered at high risk for hepatitis B if:  You were born in a country where hepatitis B occurs often. Talk with your health care provider about which countries are considered high risk.  Your parents were born in a high-risk country and you have not received a shot to protect against hepatitis B (hepatitis B vaccine).  You have HIV or AIDS.  You use needles to inject street drugs.  You live with, or have sex with, someone who has hepatitis B.  You are  a man who has sex with other men (MSM).  You get hemodialysis treatment.  You take certain medicines for conditions such as cancer, organ transplantation, and autoimmune conditions.  Hepatitis C blood testing is recommended for all people born from [birth year] through 1965 and any individual with known risks for hepatitis C.  Practice safe sex. Use condoms and avoid high-risk sexual practices to reduce the spread of sexually transmitted infections (STIs). STIs include gonorrhea, chlamydia, syphilis, trichomonas, herpes, HPV, and human immunodeficiency virus (HIV). Herpes, HIV, and HPV are viral illnesses that have no cure. They can result in disability, cancer, and death.  If you are at risk of being infected with HIV, it is recommended that you take a prescription medicine daily to prevent HIV infection. This is called preexposure prophylaxis (PrEP). You are considered at risk if:  You are a man who has sex with other men (MSM) and have other risk factors.  You are a  heterosexual man, are sexually active, and are at increased risk for HIV infection.  You take drugs by injection.  You are sexually active with a partner who has HIV.  Talk with your health care provider about whether you are at high risk of being infected with HIV. If you choose to begin PrEP, you should first be tested for HIV. You should then be tested every 3 months for as long as you are taking PrEP.  A one-time screening for abdominal aortic aneurysm (AAA) and surgical repair of large AAAs by ultrasound are recommended for men ages 11 to 24 years who are current or former smokers.  Healthy men should no longer receive prostate-specific antigen (PSA) blood tests as part of routine cancer screening. Talk with your health care provider about prostate cancer screening.  Testicular cancer screening is not recommended for adult males who have no symptoms. Screening includes self-exam, a health care provider exam, and other screening  tests. Consult with your health care provider about any symptoms you have or any concerns you have about testicular cancer.  Use sunscreen. Apply sunscreen liberally and repeatedly throughout the day. You should seek shade when your shadow is shorter than you. Protect yourself by wearing long sleeves, pants, a wide-brimmed hat, and sunglasses year round, whenever you are outdoors.  Once a month, do a whole-body skin exam, using a mirror to look at the skin on your back. Tell your health care provider about new moles, moles that have irregular borders, moles that are larger than a pencil eraser, or moles that have changed in shape or color.  Stay current with required vaccines (immunizations).  Influenza vaccine. All adults should be immunized every year.  Tetanus, diphtheria, and acellular pertussis (Td, Tdap) vaccine. An adult who has not previously received Tdap or who does not know his vaccine status should receive 1 dose of Tdap. This initial dose should be followed by tetanus and diphtheria toxoids (Td) booster doses every 10 years. Adults with an unknown or incomplete history of completing a 3-dose immunization series with Td-containing vaccines should begin or complete a primary immunization series including a Tdap dose. Adults should receive a Td booster every 10 years.  Varicella vaccine. An adult without evidence of immunity to varicella should receive 2 doses or a second dose if he has previously received 1 dose.  Human papillomavirus (HPV) vaccine. Males aged 1-21 years who have not received the vaccine previously should receive the 3-dose series. Males aged 22-26 years may be immunized. Immunization is recommended through the age of 90 years for any male who has sex with males and did not get any or all doses earlier. Immunization is recommended for any person with an immunocompromised condition through the age of 59 years if he did not get any or all doses earlier. During the 3-dose  series, the second dose should be obtained 4-8 weeks after the first dose. The third dose should be obtained 24 weeks after the first dose and 16 weeks after the second dose.  Zoster vaccine. One dose is recommended for adults aged 79 years or older unless certain conditions are present.  Measles, mumps, and rubella (MMR) vaccine. Adults born before [birth year] generally are considered immune to measles and mumps. Adults born in [birth year] or later should have 1 or more doses of MMR vaccine unless there is a contraindication to the vaccine or there is laboratory evidence of immunity to each of the three diseases. A routine second dose of MMR vaccine should  be obtained at least 28 days after the first dose for students attending postsecondary schools, health care workers, or international travelers. People who received inactivated measles vaccine or an unknown type of measles vaccine during 1963-1967 should receive 2 doses of MMR vaccine. People who received inactivated mumps vaccine or an unknown type of mumps vaccine before 1979 and are at high risk for mumps infection should consider immunization with 2 doses of MMR vaccine. Unvaccinated health care workers born before [birth year] who lack laboratory evidence of measles, mumps, or rubella immunity or laboratory confirmation of disease should consider measles and mumps immunization with 2 doses of MMR vaccine or rubella immunization with 1 dose of MMR vaccine.  Pneumococcal 13-valent conjugate (PCV13) vaccine. When indicated, a person who is uncertain of his immunization history and has no record of immunization should receive the PCV13 vaccine. An adult aged 25 years or older who has certain medical conditions and has not been previously immunized should receive 1 dose of PCV13 vaccine. This PCV13 should be followed with a dose of pneumococcal polysaccharide (PPSV23) vaccine. The PPSV23 vaccine dose should be obtained at least 8 weeks after the dose of PCV13 vaccine. An adult  aged 75 years or older who has certain medical conditions and previously received 1 or more doses of PPSV23 vaccine should receive 1 dose of PCV13. The PCV13 vaccine dose should be obtained 1 or more years after the last PPSV23 vaccine dose.  Pneumococcal polysaccharide (PPSV23) vaccine. When PCV13 is also indicated, PCV13 should be obtained first. All adults aged 22 years and older should be immunized. An adult younger than age 19 years who has certain medical conditions should be immunized. Any person who resides in a nursing home or long-term care facility should be immunized. An adult smoker should be immunized. People with an immunocompromised condition and certain other conditions should receive both PCV13 and PPSV23 vaccines. People with human immunodeficiency virus (HIV) infection should be immunized as soon as possible after diagnosis. Immunization during chemotherapy or radiation therapy should be avoided. Routine use of PPSV23 vaccine is not recommended for American Indians, [place] Natives, or people younger than 65 years unless there are medical conditions that require PPSV23 vaccine. When indicated, people who have unknown immunization and have no record of immunization should receive PPSV23 vaccine. One-time revaccination 5 years after the first dose of PPSV23 is recommended for people aged 19-64 years who have chronic kidney failure, nephrotic syndrome, asplenia, or immunocompromised conditions. People who received 1-2 doses of PPSV23 before age 88 years should receive another dose of PPSV23 vaccine at age 13 years or later if at least 5 years have passed since the previous dose. Doses of PPSV23 are not needed for people immunized with PPSV23 at or after age 79 years.  Meningococcal vaccine. Adults with asplenia or persistent complement component deficiencies should receive 2 doses of quadrivalent meningococcal conjugate (MenACWY-D) vaccine. The doses should be obtained at least 2 months apart.  Microbiologists working with certain meningococcal bacteria, [employer] recruits, people at risk during an outbreak, and people who travel to or live in countries with a high rate of meningitis should be immunized. A first-year college student up through age 19 years who is living in a residence hall should receive a dose if he did not receive a dose on or after his 16th birthday. Adults who have certain high-risk conditions should receive one or more doses of vaccine.  Hepatitis A vaccine. Adults who wish to be protected from this disease, have certain  high-risk conditions, work with hepatitis A-infected animals, work in hepatitis A research labs, or travel to or work in countries with a high rate of hepatitis A should be immunized. Adults who were previously unvaccinated and who anticipate close contact with an international adoptee during the first 60 days after arrival in the [place] from a country with a high rate of hepatitis A should be immunized.  Hepatitis B vaccine. Adults should be immunized if they wish to be protected from this disease, have certain high-risk conditions, may be exposed to blood or other infectious body fluids, are household contacts or sex partners of hepatitis B positive people, are clients or workers in certain care facilities, or travel to or work in countries with a high rate of hepatitis B.  Haemophilus influenzae type b (Hib) vaccine. A previously unvaccinated person with asplenia or sickle cell disease or having a scheduled splenectomy should receive 1 dose of Hib vaccine. Regardless of previous immunization, a recipient of a hematopoietic stem cell transplant should receive a 3-dose series 6-12 months after his successful transplant. Hib vaccine is not recommended for adults with HIV infection. Preventive Service / Frequency Ages 57 to 74  Blood pressure check.** / Every 1 to 2 years.  Lipid and cholesterol check.** / Every 5 years beginning at age  58.  Hepatitis C blood test.** / For any individual with known risks for hepatitis C.  Skin self-exam. / Monthly.  Influenza vaccine. / Every year.  Tetanus, diphtheria, and acellular pertussis (Tdap, Td) vaccine.** / Consult your health care provider. 1 dose of Td every 10 years.  Varicella vaccine.** / Consult your health care provider.  HPV vaccine. / 3 doses over 6 months, if 50 or younger.  Measles, mumps, rubella (MMR) vaccine.** / You need at least 1 dose of MMR if you were born in 1957 or later. You may also need a second dose.  Pneumococcal 13-valent conjugate (PCV13) vaccine.** / Consult your health care provider.  Pneumococcal polysaccharide (PPSV23) vaccine.** / 1 to 2 doses if you smoke cigarettes or if you have certain conditions.  Meningococcal vaccine.** / 1 dose if you are age 53 to 79 years and a Market researcher living in a residence hall, or have one of several medical conditions. You may also need additional booster doses.  Hepatitis A vaccine.** / Consult your health care provider.  Hepatitis B vaccine.** / Consult your health care provider.  Haemophilus influenzae type b (Hib) vaccine.** / Consult your health care provider. Ages 56 to 52  Blood pressure check.** / Every 1 to 2 years.  Lipid and cholesterol check.** / Every 5 years beginning at age 22.  Lung cancer screening. / Every year if you are aged 107-80 years and have a 30-pack-year history of smoking and currently smoke or have quit within the past 15 years. Yearly screening is stopped once you have quit smoking for at least 15 years or develop a health problem that would prevent you from having lung cancer treatment.  Fecal occult blood test (FOBT) of stool. / Every year beginning at age 11 and continuing until age 70. You may not have to do this test if you get a colonoscopy every 10 years.  Flexible sigmoidoscopy** or colonoscopy.** / Every 5 years for a flexible sigmoidoscopy or every  10 years for a colonoscopy beginning at age 45 and continuing until age 49.  Hepatitis C blood test.** / For all people born from [birth year] through 1965 and any individual with known  risks for hepatitis C.  Skin self-exam. / Monthly.  Influenza vaccine. / Every year.  Tetanus, diphtheria, and acellular pertussis (Tdap/Td) vaccine.** / Consult your health care provider. 1 dose of Td every 10 years.  Varicella vaccine.** / Consult your health care provider.  Zoster vaccine.** / 1 dose for adults aged 78 years or older.  Measles, mumps, rubella (MMR) vaccine.** / You need at least 1 dose of MMR if you were born in 1958 or later. You may also need a second dose.  Pneumococcal 13-valent conjugate (PCV13) vaccine.** / Consult your health care provider.  Pneumococcal polysaccharide (PPSV23) vaccine.** / 1 to 2 doses if you smoke cigarettes or if you have certain conditions.  Meningococcal vaccine.** / Consult your health care provider.  Hepatitis A vaccine.** / Consult your health care provider.  Hepatitis B vaccine.** / Consult your health care provider.  Haemophilus influenzae type b (Hib) vaccine.** / Consult your health care provider. Ages 68 and over  Blood pressure check.** / Every 1 to 2 years.  Lipid and cholesterol check.**/ Every 5 years beginning at age 39.  Lung cancer screening. / Every year if you are aged 75-80 years and have a 30-pack-year history of smoking and currently smoke or have quit within the past 15 years. Yearly screening is stopped once you have quit smoking for at least 15 years or develop a health problem that would prevent you from having lung cancer treatment.  Fecal occult blood test (FOBT) of stool. / Every year beginning at age 13 and continuing until age 34. You may not have to do this test if you get a colonoscopy every 10 years.  Flexible sigmoidoscopy** or colonoscopy.** / Every 5 years for a flexible sigmoidoscopy or every 10 years for a colonoscopy  beginning at age 62 and continuing until age 33.  Hepatitis C blood test.** / For all people born from [birth year] through 1966 and any individual with known risks for hepatitis C.  Abdominal aortic aneurysm (AAA) screening.** / A one-time screening for ages 66 to 69 years who are current or former smokers.  Skin self-exam. / Monthly.  Influenza vaccine. / Every year.  Tetanus, diphtheria, and acellular pertussis (Tdap/Td) vaccine.** / 1 dose of Td every 10 years.  Varicella vaccine.** / Consult your health care provider.  Zoster vaccine.** / 1 dose for adults aged 53 years or older.  Pneumococcal 13-valent conjugate (PCV13) vaccine.** / Consult your health care provider.  Pneumococcal polysaccharide (PPSV23) vaccine.** / 1 dose for all adults aged 55 years and older.  Meningococcal vaccine.** / Consult your health care provider.  Hepatitis A vaccine.** / Consult your health care provider.  Hepatitis B vaccine.** / Consult your health care provider.  Haemophilus influenzae type b (Hib) vaccine.** / Consult your health care provider. **Family history and personal history of risk and conditions may change your health care provider's recommendations. Document Released: 07/25/2001 Document Revised: 06/03/2013 Document Reviewed: 10/24/2010 [facility] Patient Information 2015 [place], [place]. This information is not intended to replace advice given to you by your health care provider. Make sure you discuss any questions you have with your health care provider.

## 2014-07-21 NOTE — Progress Notes (Signed)
Pre visit review using our clinic review tool, if applicable. No additional management support is needed unless otherwise documented below in the visit note. 

## 2014-07-22 LAB — URINE CULTURE
Colony Count: NO GROWTH
ORGANISM ID, BACTERIA: NO GROWTH

## 2014-08-02 DIAGNOSIS — R3911 Hesitancy of micturition: Secondary | ICD-10-CM | POA: Insufficient documentation

## 2014-08-02 NOTE — Assessment & Plan Note (Signed)
hgba1c acceptable, minimize simple carbs. Increase exercise as tolerated.  

## 2014-08-02 NOTE — Assessment & Plan Note (Signed)
Urine culture negative, patient will report if symptoms worsen

## 2014-08-02 NOTE — Assessment & Plan Note (Addendum)
Patient encouraged to maintain heart healthy. diet, regular exercise, adequate sleep. Consider daily probiotics. Take medications as prescribed. Labs reviewed. Had colonoscopy at age 64 and needs repeat at 58 unless symptoms develop. Given flu shot today

## 2014-08-02 NOTE — Assessment & Plan Note (Signed)
Mild, stable.  

## 2014-08-02 NOTE — Assessment & Plan Note (Signed)
Well controlled, no changes to meds. Encouraged heart healthy diet such as the DASH diet and exercise as tolerated. Improved on recheck 

## 2014-08-02 NOTE — Assessment & Plan Note (Signed)
Recurrent, has responded to Lidex in past, given refill. Avoids nuts, tomatoes and fruits and that helps

## 2014-08-05 ENCOUNTER — Telehealth: Payer: Self-pay | Admitting: Family Medicine

## 2014-08-05 DIAGNOSIS — M25512 Pain in left shoulder: Secondary | ICD-10-CM

## 2014-08-05 MED ORDER — CYCLOBENZAPRINE HCL 10 MG PO TABS
10.0000 mg | ORAL_TABLET | Freq: Two times a day (BID) | ORAL | Status: DC | PRN
Start: 2014-08-05 — End: 2014-10-27

## 2014-08-05 MED ORDER — CYCLOBENZAPRINE HCL 10 MG PO TABS: 10.0000 mg | ORAL_TABLET | Freq: Two times a day (BID) | ORAL | Status: DC | PRN

## 2014-08-05 NOTE — Telephone Encounter (Signed)
Called the patient informed prescription requested has been sent in to [facility]

## 2014-08-05 NOTE — Addendum Note (Signed)
Addended by: Sharon Seller B on: 08/05/2014 03:22 PM   Modules accepted: Orders

## 2014-08-05 NOTE — Telephone Encounter (Signed)
Caller name: Seith, Aikey Relation to pt: self  Call back number: [phone] Pharmacy: [facility]  [phone]  Reason for call:  Pt requesting cyclobenzaprine (FLEXERIL) 10 MG tablet please send to [facility]

## 2014-08-05 NOTE — Telephone Encounter (Signed)
Refill done a requested.

## 2014-08-13 ENCOUNTER — Ambulatory Visit (INDEPENDENT_AMBULATORY_CARE_PROVIDER_SITE_OTHER): Payer: Medicare Other | Admitting: Medical

## 2014-08-13 ENCOUNTER — Encounter: Payer: Self-pay | Admitting: Medical

## 2014-08-13 VITALS — BP 140/80 | HR 95 | Temp 98.0°F | Ht 72.0 in | Wt 164.4 lb

## 2014-08-13 DIAGNOSIS — R5383 Other fatigue: Secondary | ICD-10-CM | POA: Diagnosis not present

## 2014-08-13 DIAGNOSIS — M791 Myalgia, unspecified site: Secondary | ICD-10-CM | POA: Insufficient documentation

## 2014-08-13 DIAGNOSIS — M609 Myositis, unspecified: Secondary | ICD-10-CM | POA: Diagnosis not present

## 2014-08-13 DIAGNOSIS — R195 Other fecal abnormalities: Secondary | ICD-10-CM | POA: Diagnosis not present

## 2014-08-13 DIAGNOSIS — B349 Viral infection, unspecified: Secondary | ICD-10-CM | POA: Insufficient documentation

## 2014-08-13 DIAGNOSIS — IMO0001 Reserved for inherently not codable concepts without codable children: Secondary | ICD-10-CM

## 2014-08-13 HISTORY — DX: Reserved for inherently not codable concepts without codable children: IMO0001

## 2014-08-13 LAB — POCT INFLUENZA A/B
Influenza A, POC: NEGATIVE
Influenza B, POC: NEGATIVE

## 2014-08-13 LAB — COMPREHENSIVE METABOLIC PANEL
ALBUMIN: 4.5 g/dL (ref 3.5–5.2)
ALT: 48 U/L (ref 0–53)
AST: 52 U/L — ABNORMAL HIGH (ref 0–37)
Alkaline Phosphatase: 55 U/L (ref 39–117)
BUN: 25 mg/dL — ABNORMAL HIGH (ref 6–23)
CO2: 31 meq/L (ref 19–32)
Calcium: 9.8 mg/dL (ref 8.4–10.5)
Chloride: 96 mEq/L (ref 96–112)
Creatinine, Ser: 1.08 mg/dL (ref 0.40–1.50)
GFR: 72.94 mL/min (ref >60.00)
Glucose, Bld: 95 mg/dL (ref 70–99)
POTASSIUM: 4.3 meq/L (ref 3.5–5.1)
Sodium: 135 mEq/L (ref 135–145)
TOTAL PROTEIN: 7.8 g/dL (ref 6.0–8.3)
Total Bilirubin: 0.8 mg/dL (ref 0.2–1.2)

## 2014-08-13 LAB — CBC WITH DIFFERENTIAL/PLATELET
Basophils Absolute: 0 10*3/uL (ref 0.0–0.1)
Basophils Relative: 0.3 % (ref 0.0–3.0)
Eosinophils Absolute: 0 10*3/uL (ref 0.0–0.7)
Eosinophils Relative: 0 % (ref 0.0–5.0)
HCT: 49.5 % (ref 39.0–52.0)
HEMOGLOBIN: 17 g/dL (ref 13.0–17.0)
LYMPHS PCT: 12.3 % (ref 12.0–46.0)
Lymphs Abs: 0.6 10*3/uL — ABNORMAL LOW (ref 0.7–4.0)
MCHC: 34.4 g/dL (ref 30.0–36.0)
MCV: 88.8 fl (ref 78.0–100.0)
MONOS PCT: 15.2 % — AB (ref 3.0–12.0)
Monocytes Absolute: 0.7 10*3/uL (ref 0.1–1.0)
Neutro Abs: 3.5 10*3/uL (ref 1.4–7.7)
Neutrophils Relative %: 72.2 % (ref 43.0–77.0)
PLATELETS: 127 10*3/uL — AB (ref 150.0–400.0)
RBC: 5.57 Mil/uL (ref 4.22–5.81)
RDW: 12.6 % (ref 11.5–15.5)
WBC: 4.9 10*3/uL (ref 4.0–10.5)

## 2014-08-13 MED ORDER — OSELTAMIVIR PHOSPHATE 75 MG PO CAPS: 75.0000 mg | ORAL_CAPSULE | Freq: Two times a day (BID) | ORAL | Status: DC

## 2014-08-13 NOTE — Assessment & Plan Note (Signed)
Mild flu like symptoms with some upset stomach, and persisiting fatigue. Rapid flu. Possible type b flu. Will rx tamiflu. He is at upper end of tx window so start tamiflu today.   Will get cmp and cbc for his fatigue.

## 2014-08-13 NOTE — Assessment & Plan Note (Signed)
Resolved now but prominent will get flu test.

## 2014-08-13 NOTE — Progress Notes (Signed)
Pre visit review using our clinic review tool, if applicable. No additional management support is needed unless otherwise documented below in the visit note. 

## 2014-08-13 NOTE — Progress Notes (Signed)
Subjective:    Patient ID: Maxamilian Amadon, male    DOB: 1950/09/29, 64 y.o.   MRN: [id number]  HPI   Pt in stating this Monday morning he had body aches. Pt states achiness was diffuse. Achiness got better yesterday. Fatigue as well. No fever. No soar throat. Felt cold on Monday. No sweating. Pt had some upset stomach and some diarrhea. Pt had one loose stool a day. Not watery. No family or friends sick.   Pt did get flu vaccine one month ago.  Pt states transient upset faint  stomach after eating. Will last one hour. NO nausea or vomiting. Did have some nausea on Monday.     Review of Systems  Constitutional: Positive for fatigue. Negative for fever and chills.  HENT: Negative.   Respiratory: Negative for cough, choking, chest tightness, shortness of breath and wheezing.   Cardiovascular: Negative for chest pain and palpitations.  Gastrointestinal: Negative for nausea, vomiting, diarrhea, constipation, blood in stool, abdominal distention and anal bleeding.       Nausea on Monday only. Upset stomach after eating. One loose stool a day.  Genitourinary: Negative.   Musculoskeletal: Positive for myalgias.       Early on for 3 days. None today.  Neurological: Negative for seizures, syncope, light-headedness, numbness and headaches.  Hematological: Negative for adenopathy. Does not bruise/bleed easily.  Psychiatric/Behavioral: Negative for behavioral problems and confusion.   Past Medical History  Diagnosis Date  . Hepatitis C, chronic   . History of intravenous drug use in remission   . Arm mass   . Arm skin lesion, left 11/12/2012    And sun damaged skin  . Elevated BP 11/14/2012  . Left shoulder pain 12/16/2012  . Preventative health care 05/14/2013  . History of oral lesions 05/14/2013    History   Social History  . Marital Status: Married    Spouse Name: N/A  . Number of Children: N/A  . Years of Education: N/A   Occupational History  . Not on file.   Social  History Main Topics  . Smoking status: Never Smoker   . Smokeless tobacco: Never Used  . Alcohol Use: No  . Drug Use: No  . Sexual Activity: Yes     Comment: avoids nuts, fruits, lives with wife, makes picture frames, building maintenance   Other Topics Concern  . Not on file   Social History Narrative    Past Surgical History  Procedure Laterality Date  . Knee arthroscopy  1996    ligment repair    Family History  Problem Relation Age of Onset  . Heart disease Father   . Diabetes Father   . Hyperlipidemia Father   . Hypertension Father   . Cancer Neg Hx     negative for colon and prostate  . Thyroid disease Sister   . Heart disease Cousin     MI  . Diabetes Cousin   . Obesity Cousin   . Osteoporosis Mother   . Diabetes Paternal Grandfather     No Known Allergies  Current Outpatient Prescriptions on File Prior to Visit  Medication Sig Dispense Refill  . Alum & Mag Hydroxide-Simeth (MAGIC MOUTHWASH W/LIDOCAINE) SOLN Take 10 mLs by mouth 3 (three) times daily as needed. Swish and spit. 240 mL 0  . cyclobenzaprine (FLEXERIL) 10 MG tablet Take 1 tablet (10 mg total) by mouth 2 (two) times daily as needed for muscle spasms. 30 tablet 2  . fluocinonide gel (LIDEX) 0.05 % Apply  1 application topically 2 (two) times daily as needed. Applies to tongue as needed 60 g 1  . glucosamine-chondroitin 500-400 MG tablet Take 1 tablet by mouth every morning.     . methadone (DOLOPHINE) 10 MG/5ML solution Take 12 mg by mouth every morning.     . Multiple Vitamin (MULTIVITAMIN) tablet Take 1 tablet by mouth every morning.     . naproxen (NAPROSYN) 500 MG tablet Take 1 tablet (500 mg total) by mouth 2 (two) times daily with a meal. 60 tablet 1  . traMADol (ULTRAM) 50 MG tablet Take 1 tablet (50 mg total) by mouth 2 (two) times daily as needed. 60 tablet 0   No current facility-administered medications on file prior to visit.    BP 140/80 mmHg  Pulse 95  Temp(Src) 98 F (36.7 C)  (Oral)  Ht 6' (1.829 m)  Wt 164 lb 6.4 oz (74.571 kg)  BMI 22.29 kg/m2  SpO2 100%      Objective:   Physical Exam  General  Mental Status - Alert. General Appearance - Well groomed. Not in acute distress.  Skin Rashes- No Rashes.  HEENT Head- Normal. Ear Auditory Canal - Left- Normal. Right - Normal.Tympanic Membrane- Left- Normal. Right- Normal. Eye Sclera/Conjunctiva- Left- Normal. Right- Normal. Nose & Sinuses Nasal Mucosa- Left-  Not oggy or Congested. Right-  Not  boggy or Congested. Mouth & Throat Lips: Upper Lip- Normal: no dryness, cracking, pallor, cyanosis, or vesicular eruption. Lower Lip-Normal: no dryness, cracking, pallor, cyanosis or vesicular eruption. Buccal Mucosa- Bilateral- No Aphthous ulcers. Oropharynx- No Discharge or Erythema. Tonsils: Characteristics- Bilateral- No Erythema or Congestion. Size/Enlargement- Bilateral- No enlargement. Discharge- bilateral-None.  Neck Neck- Supple. No Masses.   Chest and Lung Exam Auscultation: Breath Sounds:- even and unlabored,   Cardiovascular Auscultation:Rythm- Regular, rate and rhythm. Murmurs & Other Heart Sounds:Ausculatation of the heart reveal- No Murmurs.  Lymphatic Head & Neck General Head & Neck Lymphatics: Bilateral: Description- No Localized lymphadenopathy.    Abdomen Inspection:-Inspection Normal.  Palpation/Perucssion: Palpation and Percussion of the abdomen reveal- Non Tender, No Rebound tenderness, No rigidity(Guarding) and No Palpable abdominal masses.  Liver:-Normal.  Spleen:- Normal.          Assessment & Plan:

## 2014-08-13 NOTE — Patient Instructions (Addendum)
Myalgia and myositis Resolved now but prominent will get flu test.   Loose stools Actually only one time a day  with other viral syndome symptoms. Will advise conservative bland diet guidelines and immodium otc. If worsens then would get stool panel studies. But not necessary presently.   Viral syndrome Mild flu like symptoms with some upset stomach, and persisiting fatigue. Rapid flu. Possible type b flu. Will rx tamiflu. He is at upper end of tx window so start tamiflu today.   Will get cmp and cbc for his fatigue.    You may have the flu even though your  rapid flu test was negative.(Important to note sometimes flu test can be falsely negative.) Therefore,  I am treating you with tamiflu based on your clinical presentation. Rest, hydrate and take tylenol for fever. Alternate ibuprofen  if necessary for body ache or fever. You should gradually improve but  if you develop secondary bacterial infection signs and symptoms  please notify us so reevaluation or antibiotic can be prescribed.    Follow up in 7 days or as needed

## 2014-08-13 NOTE — Assessment & Plan Note (Addendum)
Actually only one time a day  with other viral syndome symptoms. Will advise conservative bland diet guidelines and immodium otc. If worsens then would get stool panel studies. But not necessary presently.

## 2014-10-27 ENCOUNTER — Other Ambulatory Visit: Payer: Self-pay | Admitting: Family Medicine

## 2014-12-07 ENCOUNTER — Other Ambulatory Visit: Payer: Self-pay

## 2015-01-19 ENCOUNTER — Ambulatory Visit (INDEPENDENT_AMBULATORY_CARE_PROVIDER_SITE_OTHER): Payer: Medicare Other | Admitting: Family Medicine

## 2015-01-19 ENCOUNTER — Encounter: Payer: Self-pay | Admitting: Family Medicine

## 2015-01-19 VITALS — BP 140/82 | HR 83 | Temp 98.8°F | Ht 72.0 in | Wt 179.4 lb

## 2015-01-19 DIAGNOSIS — D696 Thrombocytopenia, unspecified: Secondary | ICD-10-CM | POA: Diagnosis not present

## 2015-01-19 DIAGNOSIS — Z8719 Personal history of other diseases of the digestive system: Secondary | ICD-10-CM

## 2015-01-19 DIAGNOSIS — R030 Elevated blood-pressure reading, without diagnosis of hypertension: Secondary | ICD-10-CM

## 2015-01-19 DIAGNOSIS — M25512 Pain in left shoulder: Secondary | ICD-10-CM | POA: Diagnosis not present

## 2015-01-19 DIAGNOSIS — F1190 Opioid use, unspecified, uncomplicated: Secondary | ICD-10-CM

## 2015-01-19 DIAGNOSIS — F1120 Opioid dependence, uncomplicated: Secondary | ICD-10-CM

## 2015-01-19 DIAGNOSIS — R739 Hyperglycemia, unspecified: Secondary | ICD-10-CM | POA: Diagnosis not present

## 2015-01-19 DIAGNOSIS — IMO0001 Reserved for inherently not codable concepts without codable children: Secondary | ICD-10-CM

## 2015-01-19 LAB — CBC
HEMATOCRIT: 45 % (ref 39.0–52.0)
Hemoglobin: 15.1 g/dL (ref 13.0–17.0)
MCHC: 33.5 g/dL (ref 30.0–36.0)
MCV: 91 fl (ref 78.0–100.0)
Platelets: 129 10*3/uL — ABNORMAL LOW (ref 150.0–400.0)
RBC: 4.94 Mil/uL (ref 4.22–5.81)
RDW: 13.4 % (ref 11.5–15.5)
WBC: 4.1 10*3/uL (ref 4.0–10.5)

## 2015-01-19 LAB — COMPREHENSIVE METABOLIC PANEL
ALBUMIN: 4.6 g/dL (ref 3.5–5.2)
ALT: 54 U/L — AB (ref 0–53)
AST: 43 U/L — ABNORMAL HIGH (ref 0–37)
Alkaline Phosphatase: 56 U/L (ref 39–117)
BUN: 24 mg/dL — ABNORMAL HIGH (ref 6–23)
CO2: 33 meq/L — AB (ref 19–32)
Calcium: 9.7 mg/dL (ref 8.4–10.5)
Chloride: 102 mEq/L (ref 96–112)
Creatinine, Ser: 1.24 mg/dL (ref 0.40–1.50)
GFR: 62.11 mL/min (ref >60.00)
Glucose, Bld: 128 mg/dL — ABNORMAL HIGH (ref 70–99)
Potassium: 4.3 mEq/L (ref 3.5–5.1)
SODIUM: 140 meq/L (ref 135–145)
TOTAL PROTEIN: 7.1 g/dL (ref 6.0–8.3)
Total Bilirubin: 0.8 mg/dL (ref 0.2–1.2)

## 2015-01-19 LAB — HEMOGLOBIN A1C: Hgb A1c MFr Bld: 5.7 % (ref 4.6–6.5)

## 2015-01-19 MED ORDER — MAGIC MOUTHWASH: 5.0000 mL | Freq: Two times a day (BID) | ORAL | Status: DC

## 2015-01-19 MED ORDER — LISINOPRIL 2.5 MG PO TABS: 2.5000 mg | ORAL_TABLET | Freq: Every day | ORAL | Status: DC

## 2015-01-19 MED ORDER — CYCLOBENZAPRINE HCL 10 MG PO TABS: ORAL_TABLET | ORAL | Status: DC

## 2015-01-19 MED ORDER — FLUOCINONIDE 0.05 % EX GEL: 1.0000 "application " | Freq: Two times a day (BID) | CUTANEOUS | Status: DC | PRN

## 2015-01-19 NOTE — Progress Notes (Signed)
Shaun [name]  [id number] 06-28-1950 01/19/2015      Progress Note-Follow Up  Subjective  Chief Complaint  Chief Complaint  Patient presents with  . Follow-up    HPI  Patient is a 64 y.o. male in today for routine medical care.He is doing well. Is considering coming off of his Metadone. He continues to struggle with shoulder pain, it fluctuates in intensity but has struggled with it for roughly 2 years. Also notes right foot pain, across the top of the metatarsals, no injury, swelling or redness. Not worse with walking or redness. Continues to struggle with painful oral lesions worse after eating spicy foods. Also notes some occasional loose stool but no bloody or tarry stool. Denies CP/palp/SOB/HA/congestion/fevers or GU c/o. Taking meds as prescribed  Past Medical History  Diagnosis Date  . Hepatitis C, chronic   . History of intravenous drug use in remission   . Arm mass   . Arm skin lesion, left 11/12/2012    And sun damaged skin  . Elevated BP 11/14/2012  . Left shoulder pain 12/16/2012  . Preventative health care 05/14/2013  . History of oral lesions 05/14/2013    Past Surgical History  Procedure Laterality Date  . Knee arthroscopy  1996    ligment repair    Family History  Problem Relation Age of Onset  . Heart disease Father   . Diabetes Father   . Hyperlipidemia Father   . Hypertension Father   . Cancer Neg Hx     negative for colon and prostate  . Thyroid disease Sister   . Heart disease Cousin     MI  . Diabetes Cousin   . Obesity Cousin   . Osteoporosis Mother   . Diabetes Paternal Grandfather     History   Social History  . Marital Status: Married    Spouse Name: N/A  . Number of Children: N/A  . Years of Education: N/A   Occupational History  . Not on file.   Social History Main Topics  . Smoking status: Never Smoker   . Smokeless tobacco: Never Used  . Alcohol Use: No  . Drug Use: No  . Sexual Activity: Yes     Comment: avoids nuts,  fruits, lives with wife, makes picture frames, building maintenance   Other Topics Concern  . Not on file   Social History Narrative    Current Outpatient Prescriptions on File Prior to Visit  Medication Sig Dispense Refill  . cyclobenzaprine (FLEXERIL) 10 MG tablet Take 1 tablet (10 mg total) by mouth 2 (two) times daily as needed for muscle spasms. 30 tablet 2  . fluocinonide gel (LIDEX) 6.44 % Apply 1 application topically 2 (two) times daily as needed. Applies to tongue as needed 60 g 1  . methadone (DOLOPHINE) 10 MG/5ML solution Take 8 mg by mouth every morning.     . Multiple Vitamin (MULTIVITAMIN) tablet Take 1 tablet by mouth every morning.     . naproxen (NAPROSYN) 500 MG tablet Take 1 tablet (500 mg total) by mouth 2 (two) times daily with a meal. 60 tablet 1  . glucosamine-chondroitin 500-400 MG tablet Take 1 tablet by mouth every morning.     . traMADol (ULTRAM) 50 MG tablet Take 1 tablet (50 mg total) by mouth 2 (two) times daily as needed. (Patient not taking: Reported on 01/19/2015) 60 tablet 0   No current facility-administered medications on file prior to visit.    No Known Allergies  Review of  Systems  Review of Systems  Constitutional: Negative for fever and malaise/fatigue.  HENT: Negative for congestion.   Eyes: Negative for discharge.  Respiratory: Negative for shortness of breath.   Cardiovascular: Negative for chest pain, palpitations and leg swelling.  Gastrointestinal: Positive for diarrhea. Negative for nausea and abdominal pain.  Genitourinary: Negative for dysuria.  Musculoskeletal: Positive for joint pain. Negative for falls.       Left shoulder pain  Skin: Negative for rash.  Neurological: Negative for loss of consciousness and headaches.  Endo/Heme/Allergies: Negative for polydipsia.  Psychiatric/Behavioral: Negative for depression and suicidal ideas. The patient is not nervous/anxious and does not have insomnia.     Objective  BP 140/82 mmHg   Pulse 83  Temp(Src) 98.8 F (37.1 C) (Oral)  Ht 6' (1.829 m)  Wt 179 lb 6 oz (81.364 kg)  BMI 24.32 kg/m2  SpO2 98%  Physical Exam  Physical Exam  Constitutional: He is oriented to person, place, and time and well-developed, well-nourished, and in no distress. No distress.  HENT:  Head: Normocephalic and atraumatic.  Eyes: Conjunctivae are normal.  Neck: Neck supple. No thyromegaly present.  Cardiovascular: Normal rate, regular rhythm and normal heart sounds.   No murmur heard. Pulmonary/Chest: Effort normal and breath sounds normal. No respiratory distress.  Abdominal: He exhibits no distension and no mass. There is no tenderness.  Musculoskeletal: He exhibits no edema.  Neurological: He is alert and oriented to person, place, and time.  Skin: Skin is warm.  Psychiatric: Memory, affect and judgment normal.    Lab Results  Component Value Date   TSH 1.28 07/21/2014   Lab Results  Component Value Date   WBC 4.9 08/13/2014   HGB 17.0 08/13/2014   HCT 49.5 08/13/2014   MCV 88.8 08/13/2014   PLT 127.0* 08/13/2014   Lab Results  Component Value Date   CREATININE 1.08 08/13/2014   BUN 25* 08/13/2014   NA 135 08/13/2014   K 4.3 08/13/2014   CL 96 08/13/2014   CO2 31 08/13/2014   Lab Results  Component Value Date   ALT 48 08/13/2014   AST 52* 08/13/2014   ALKPHOS 55 08/13/2014   BILITOT 0.8 08/13/2014   Lab Results  Component Value Date   CHOL 110 01/13/2014   Lab Results  Component Value Date   HDL 53 01/13/2014   Lab Results  Component Value Date   LDLCALC 46 01/13/2014   Lab Results  Component Value Date   TRIG 53 01/13/2014   Lab Results  Component Value Date   CHOLHDL 2.1 01/13/2014     Assessment & Plan  Elevated BP Well controlled, no changes to meds. Encouraged heart healthy diet such as the DASH diet and exercise as tolerated.   Left shoulder pain Encouraged  topical treatments and referred to sports med at this time for further  treatment has been struggling with symptoms for roughly 2 years.  Thrombocytopenia Mild, warned regarding signs of dropping platelets  History of oral lesions Notes symptoms worsen with certain spicy foods, avoid offending foods and given refill on Magic Mouthwash to use prn. May request referral for allergy testing, he declines for now.   Methadone use Follows with a clinic, takes a very low dose, is interested in titrating off. Will discuss with his clinic and proceed slowly.  Hyperglycemia hgba1c acceptable, minimize simple carbs. Increase exercise as tolerated.

## 2015-01-19 NOTE — Progress Notes (Signed)
Pre visit review using our clinic review tool, if applicable. No additional management support is needed unless otherwise documented below in the visit note. 

## 2015-01-19 NOTE — Patient Instructions (Signed)
Zyrtec or Clartin or Allegra daily for 4-6 weeks to assess response   Basic Carbohydrate Counting for Diabetes Mellitus Carbohydrate counting is a method for keeping track of the amount of carbohydrates you eat. Eating carbohydrates naturally increases the level of sugar (glucose) in your blood, so it is important for you to know the amount that is okay for you to have in every meal. Carbohydrate counting helps keep the level of glucose in your blood within normal limits. The amount of carbohydrates allowed is different for every person. A dietitian can help you calculate the amount that is right for you. Once you know the amount of carbohydrates you can have, you can count the carbohydrates in the foods you want to eat. Carbohydrates are found in the following foods:  Grains, such as breads and cereals.  Dried beans and soy products.  Starchy vegetables, such as potatoes, peas, and corn.  Fruit and fruit juices.  Milk and yogurt.  Sweets and snack foods, such as cake, cookies, candy, chips, soft drinks, and fruit drinks. CARBOHYDRATE COUNTING There are two ways to count the carbohydrates in your food. You can use either of the methods or a combination of both. Reading the "Nutrition Facts" on [place] The "Nutrition Facts" is an area that is included on the labels of almost all packaged food and beverages in the [place]. It includes the serving size of that food or beverage and information about the nutrients in each serving of the food, including the grams (g) of carbohydrate per serving.  Decide the number of servings of this food or beverage that you will be able to eat or drink. Multiply that number of servings by the number of grams of carbohydrate that is listed on the label for that serving. The total will be the amount of carbohydrates you will be having when you eat or drink this food or beverage. Learning Standard Serving Sizes of Food When you eat food that is not  packaged or does not include "Nutrition Facts" on the label, you need to measure the servings in order to count the amount of carbohydrates.A serving of most carbohydrate-rich foods contains about 15 g of carbohydrates. The following list includes serving sizes of carbohydrate-rich foods that provide 15 g ofcarbohydrate per serving:   1 slice of bread (1 oz) or 1 six-inch tortilla.    of a hamburger bun or English muffin.  4-6 crackers.   cup unsweetened dry cereal.    cup hot cereal.   cup rice or pasta.    cup mashed potatoes or  of a large baked potato.  1 cup fresh fruit or one small piece of fruit.    cup canned or frozen fruit or fruit juice.  1 cup milk.   cup plain fat-free yogurt or yogurt sweetened with artificial sweeteners.   cup cooked dried beans or starchy vegetable, such as peas, corn, or potatoes.  Decide the number of standard-size servings that you will eat. Multiply that number of servings by 15 (the grams of carbohydrates in that serving). For example, if you eat 2 cups of strawberries, you will have eaten 2 servings and 30 g of carbohydrates (2 servings x 15 g = 30 g). For foods such as soups and casseroles, in which more than one food is mixed in, you will need to count the carbohydrates in each food that is included. EXAMPLE OF CARBOHYDRATE COUNTING Sample Dinner  3 oz chicken breast.   cup of brown rice.  cup of corn.  1 cup milk.   1 cup strawberries with sugar-free whipped topping.  Carbohydrate Calculation Step 1: Identify the foods that contain carbohydrates:   Rice.   Corn.   Milk.   Strawberries. Step 2:Calculate the number of servings eaten of each:   2 servings of rice.   1 serving of corn.   1 serving of milk.   1 serving of strawberries. Step 3: Multiply each of those number of servings by 15 g:   2 servings of rice x 15 g = 30 g.   1 serving of corn x 15 g = 15 g.   1 serving of milk x 15  g = 15 g.   1 serving of strawberries x 15 g = 15 g. Step 4: Add together all of the amounts to find the total grams of carbohydrates eaten: 30 g + 15 g + 15 g + 15 g = 75 g. Document Released: 05/29/2005 Document Revised: 10/13/2013 Document Reviewed: 04/25/2013 [facility] Patient Information 2015 [place], [place]. This information is not intended to replace advice given to you by your health care provider. Make sure you discuss any questions you have with your health care provider.

## 2015-01-20 ENCOUNTER — Telehealth: Payer: Self-pay | Admitting: Family Medicine

## 2015-01-20 NOTE — Telephone Encounter (Signed)
Needs clarification on volume.  Please advise.

## 2015-01-20 NOTE — Telephone Encounter (Signed)
240 ml please

## 2015-01-20 NOTE — Telephone Encounter (Signed)
Caller name: Jenny Reichmann  Relationship to patient: Pharmacist  Can be reached: [phone] Pharmacy: [facility]   Reason for call: He need clarity on the Rx dosage requested for this pt. Alum & Mag Hydroxide-Simeth (MAGIC MOUTHWASH) SOLN. Please advise.

## 2015-01-21 MED ORDER — MAGIC MOUTHWASH: 5.0000 mL | Freq: Two times a day (BID) | ORAL | Status: DC

## 2015-01-21 NOTE — Telephone Encounter (Signed)
Volume called into the pharmacy.

## 2015-01-21 NOTE — Telephone Encounter (Signed)
Faxed hardcopy for Mouthwash into [facility].

## 2015-01-26 ENCOUNTER — Ambulatory Visit (INDEPENDENT_AMBULATORY_CARE_PROVIDER_SITE_OTHER): Payer: Medicare Other | Admitting: Family Medicine

## 2015-01-26 ENCOUNTER — Encounter: Payer: Self-pay | Admitting: Family Medicine

## 2015-01-26 VITALS — BP 155/87 | HR 67 | Ht 72.0 in | Wt 165.0 lb

## 2015-01-26 DIAGNOSIS — M25512 Pain in left shoulder: Secondary | ICD-10-CM | POA: Diagnosis not present

## 2015-01-26 MED ORDER — NITROGLYCERIN 0.2 MG/HR TD PT24: MEDICATED_PATCH | TRANSDERMAL | Status: DC

## 2015-01-26 NOTE — Patient Instructions (Signed)
You have rotator cuff impingement Try to avoid painful activities (overhead activities, lifting with extended arm) as much as possible. Aleve 2 tabs twice a day with food OR ibuprofen 3 tabs three times a day with food for pain and inflammation. Can take tylenol in addition to this. Subacromial injection may be beneficial to help with pain and to decrease inflammation. Consider physical therapy with transition to home exercise program. Do home exercise program with theraband and scapular stabilization exercises daily - these are very important for long term relief even if an injection was given.  3 sets of 10 once a day for next 6 weeks. Nitro patches - 1/4th patch over affected shoulder, change daily.  Can use this on the right shoulder too if you're not getting headaches from it. If not improving at follow-up we will consider further imaging, injection, physical therapy. Follow up with me in 5-6 weeks.

## 2015-01-28 NOTE — Assessment & Plan Note (Signed)
2/2 rotator cuff impingement.  Shown home exercises to do daily, discussed nsaids.  Start nitro patches - discussed risks of headaches, skin irritation.  Consider PT, injection, further imaging if not improving.  F/u in 5-6 weeks.

## 2015-01-28 NOTE — Progress Notes (Signed)
PCP: Penni Homans, MD  Subjective:   HPI: Patient is a 64 y.o. male here for left shoulder pain.  Patient has had about 2 years of left shoulder pain. No known injury or trauma. At beginning of this he had an injection which helped. Past 2-3 months pain has really intensified. Pain 2/10 at rest, up to 5/10 with activities. Is right handed. Worse with lifting, twisting, pulling. No night pain. Worse walking the dog. Did PT previously for this shoulder.  Past Medical History  Diagnosis Date  . Hepatitis C, chronic   . History of intravenous drug use in remission   . Arm mass   . Arm skin lesion, left 11/12/2012    And sun damaged skin  . Elevated BP 11/14/2012  . Left shoulder pain 12/16/2012  . Preventative health care 05/14/2013  . History of oral lesions 05/14/2013    Current Outpatient Prescriptions on File Prior to Visit  Medication Sig Dispense Refill  . Alum & Mag Hydroxide-Simeth (MAGIC MOUTHWASH) SOLN Take 5 mLs by mouth 2 (two) times daily. 240 mL 0  . cyclobenzaprine (FLEXERIL) 10 MG tablet Take 1 tablet (10 mg total) by mouth 2 (two) times daily as needed for muscle spasms. 30 tablet 2  . fluocinonide gel (LIDEX) 6.31 % Apply 1 application topically 2 (two) times daily as needed. Applies to tongue as needed 60 g 1  . glucosamine-chondroitin 500-400 MG tablet Take 1 tablet by mouth every morning.     Marland Kitchen lisinopril (PRINIVIL,ZESTRIL) 2.5 MG tablet Take 1 tablet (2.5 mg total) by mouth daily. 30 tablet 5  . methadone (DOLOPHINE) 10 MG/5ML solution Take 8 mg by mouth every morning.     . Multiple Vitamin (MULTIVITAMIN) tablet Take 1 tablet by mouth every morning.     . naproxen (NAPROSYN) 500 MG tablet Take 1 tablet (500 mg total) by mouth 2 (two) times daily with a meal. 60 tablet 1  . traMADol (ULTRAM) 50 MG tablet Take 1 tablet (50 mg total) by mouth 2 (two) times daily as needed. (Patient not taking: Reported on 01/19/2015) 60 tablet 0   No current facility-administered  medications on file prior to visit.    Past Surgical History  Procedure Laterality Date  . Knee arthroscopy  1996    ligment repair    No Known Allergies  Social History   Social History  . Marital Status: Married    Spouse Name: N/A  . Number of Children: N/A  . Years of Education: N/A   Occupational History  . Not on file.   Social History Main Topics  . Smoking status: Never Smoker   . Smokeless tobacco: Never Used  . Alcohol Use: No  . Drug Use: No  . Sexual Activity: Yes     Comment: avoids nuts, fruits, lives with wife, makes picture frames, building maintenance   Other Topics Concern  . Not on file   Social History Narrative    Family History  Problem Relation Age of Onset  . Heart disease Father   . Diabetes Father   . Hyperlipidemia Father   . Hypertension Father   . Cancer Neg Hx     negative for colon and prostate  . Thyroid disease Sister   . Heart disease Cousin     MI  . Diabetes Cousin   . Obesity Cousin   . Osteoporosis Mother   . Diabetes Paternal Grandfather     BP 155/87 mmHg  Pulse 67  Ht 6' (1.829  m)  Wt 165 lb (74.844 kg)  BMI 22.37 kg/m2  Review of Systems: See HPI above.    Objective:  Physical Exam:  Gen: NAD  Left shoulder: No swelling, ecchymoses.  No gross deformity. No TTP. FROM with painful arc.Marland Kitchen Positive Hawkins, Neers. Negative Speeds, Yergasons. Strength 5/5 with empty can and resisted internal/external rotation.  Pain empty can. Negative apprehension. NV intact distally.    Assessment & Plan:  1. Left shoulder pain - 2/2 rotator cuff impingement.  Shown home exercises to do daily, discussed nsaids.  Start nitro patches - discussed risks of headaches, skin irritation.  Consider PT, injection, further imaging if not improving.  F/u in 5-6 weeks.

## 2015-01-31 ENCOUNTER — Encounter: Payer: Self-pay | Admitting: Family Medicine

## 2015-01-31 DIAGNOSIS — F1120 Opioid dependence, uncomplicated: Secondary | ICD-10-CM | POA: Insufficient documentation

## 2015-01-31 DIAGNOSIS — M25519 Pain in unspecified shoulder: Secondary | ICD-10-CM | POA: Insufficient documentation

## 2015-01-31 DIAGNOSIS — D696 Thrombocytopenia, unspecified: Secondary | ICD-10-CM | POA: Insufficient documentation

## 2015-01-31 DIAGNOSIS — F1190 Opioid use, unspecified, uncomplicated: Secondary | ICD-10-CM

## 2015-01-31 HISTORY — DX: Opioid use, unspecified, uncomplicated: F11.90

## 2015-01-31 NOTE — Assessment & Plan Note (Signed)
Well controlled, no changes to meds. Encouraged heart healthy diet such as the DASH diet and exercise as tolerated.  °

## 2015-01-31 NOTE — Assessment & Plan Note (Signed)
Mild, warned regarding signs of dropping platelets

## 2015-01-31 NOTE — Assessment & Plan Note (Signed)
Notes symptoms worsen with certain spicy foods, avoid offending foods and given refill on Magic Mouthwash to use prn. May request referral for allergy testing, he declines for now.

## 2015-01-31 NOTE — Assessment & Plan Note (Signed)
hgba1c acceptable, minimize simple carbs. Increase exercise as tolerated.  

## 2015-01-31 NOTE — Assessment & Plan Note (Addendum)
Encouraged  topical treatments and referred to sports med at this time for further treatment has been struggling with symptoms for roughly 2 years.

## 2015-01-31 NOTE — Assessment & Plan Note (Deleted)
Mild, warned regarding signs of dropping platelets

## 2015-01-31 NOTE — Assessment & Plan Note (Signed)
Follows with a clinic, takes a very low dose, is interested in titrating off. Will discuss with his clinic and proceed slowly.

## 2015-03-03 ENCOUNTER — Ambulatory Visit (INDEPENDENT_AMBULATORY_CARE_PROVIDER_SITE_OTHER): Payer: Medicare Other | Admitting: Family Medicine

## 2015-03-03 ENCOUNTER — Encounter: Payer: Self-pay | Admitting: Family Medicine

## 2015-03-03 VITALS — BP 159/89 | HR 80 | Ht 72.0 in | Wt 160.0 lb

## 2015-03-03 DIAGNOSIS — M25512 Pain in left shoulder: Secondary | ICD-10-CM

## 2015-03-03 NOTE — Patient Instructions (Signed)
Your rotator cuff symptoms have improved though your issue now is proximal biceps tendinitis/strain. Try to avoid lifting more than 5 pounds when possible, opening things with this hand. Aleve 2 tabs twice a day with food OR ibuprofen 3 tabs three times a day with food as needed for pain and inflammation. Can take tylenol in addition to this. Call me if you want to do physical therapy. Do the light weight arm curls and the hammer exercise we discussed 3 sets of 10 of each once a day. Follow up with me in 6 weeks.

## 2015-03-08 ENCOUNTER — Ambulatory Visit (INDEPENDENT_AMBULATORY_CARE_PROVIDER_SITE_OTHER): Payer: Medicare Other | Admitting: Physician Assistant

## 2015-03-08 ENCOUNTER — Encounter: Payer: Self-pay | Admitting: Physician Assistant

## 2015-03-08 VITALS — BP 138/88 | HR 69 | Temp 98.1°F | Resp 16 | Ht 72.0 in | Wt 166.5 lb

## 2015-03-08 DIAGNOSIS — J00 Acute nasopharyngitis [common cold]: Secondary | ICD-10-CM

## 2015-03-08 DIAGNOSIS — J208 Acute bronchitis due to other specified organisms: Principal | ICD-10-CM

## 2015-03-08 DIAGNOSIS — B9689 Other specified bacterial agents as the cause of diseases classified elsewhere: Secondary | ICD-10-CM

## 2015-03-08 MED ORDER — DOXYCYCLINE HYCLATE 100 MG PO CAPS: 100.0000 mg | ORAL_CAPSULE | Freq: Two times a day (BID) | ORAL | Status: DC

## 2015-03-08 NOTE — Assessment & Plan Note (Signed)
Rx Doxycycline.  Increase fluids.  Rest.  Saline nasal spray.  Probiotic.  Mucinex as directed.  Humidifier in bedroom.  Call or return to clinic if symptoms are not improving.  

## 2015-03-08 NOTE — Patient Instructions (Signed)
Take antibiotic (Doxycycline) as directed.  Increase fluids.  Get plenty of rest. Use Mucinex-DM for congestion. Take a daily probiotic (I recommend Align or Culturelle, but even Activia Yogurt may be beneficial).  A humidifier placed in the bedroom may offer some relief for a dry, scratchy throat of nasal irritation.  Read information below on acute bronchitis. Please call or return to clinic if symptoms are not improving.  Acute Bronchitis Bronchitis is when the airways that extend from the windpipe into the lungs get red, puffy, and painful (inflamed). Bronchitis often causes thick spit (mucus) to develop. This leads to a cough. A cough is the most common symptom of bronchitis. In acute bronchitis, the condition usually begins suddenly and goes away over time (usually in 2 weeks). Smoking, allergies, and asthma can make bronchitis worse. Repeated episodes of bronchitis may cause more lung problems.  HOME CARE  Rest.  Drink enough fluids to keep your pee (urine) clear or pale yellow (unless you need to limit fluids as told by your doctor).  Only take over-the-counter or prescription medicines as told by your doctor.  Avoid smoking and secondhand smoke. These can make bronchitis worse. If you are a smoker, think about using nicotine gum or skin patches. Quitting smoking will help your lungs heal faster.  Reduce the chance of getting bronchitis again by:  Washing your hands often.  Avoiding people with cold symptoms.  Trying not to touch your hands to your mouth, nose, or eyes.  Follow up with your doctor as told.  GET HELP IF: Your symptoms do not improve after 1 week of treatment. Symptoms include:  Cough.  Fever.  Coughing up thick spit.  Body aches.  Chest congestion.  Chills.  Shortness of breath.  Sore throat.  GET HELP RIGHT AWAY IF:   You have an increased fever.  You have chills.  You have severe shortness of breath.  You have bloody thick spit  (sputum).  You throw up (vomit) often.  You lose too much body fluid (dehydration).  You have a severe headache.  You faint.  MAKE SURE YOU:   Understand these instructions.  Will watch your condition.  Will get help right away if you are not doing well or get worse. Document Released: 11/15/2007 Document Revised: 01/29/2013 Document Reviewed: 11/19/2012 [facility] Patient Information 2015 [place], [place]. This information is not intended to replace advice given to you by your health care provider. Make sure you discuss any questions you have with your health care provider.

## 2015-03-08 NOTE — Progress Notes (Signed)
Patient presents to clinic today c/o 6 weeks of productive cough, chest congestion with fatigue. Denies SOB. Denies fever, chills, recent travel or sick contact.  Past Medical History  Diagnosis Date  . Hepatitis C, chronic   . History of intravenous drug use in remission   . Arm mass   . Arm skin lesion, left 11/12/2012    And sun damaged skin  . Elevated BP 11/14/2012  . Left shoulder pain 12/16/2012  . Preventative health care 05/14/2013  . History of oral lesions 05/14/2013  . Methadone use 01/31/2015    Current Outpatient Prescriptions on File Prior to Visit  Medication Sig Dispense Refill  . Alum & Mag Hydroxide-Simeth (MAGIC MOUTHWASH) SOLN Take 5 mLs by mouth 2 (two) times daily. 240 mL 0  . cyclobenzaprine (FLEXERIL) 10 MG tablet Take 1 tablet (10 mg total) by mouth 2 (two) times daily as needed for muscle spasms. 30 tablet 2  . fluocinonide gel (LIDEX) 7.41 % Apply 1 application topically 2 (two) times daily as needed. Applies to tongue as needed 60 g 1  . glucosamine-chondroitin 500-400 MG tablet Take 1 tablet by mouth every morning.     Marland Kitchen lisinopril (PRINIVIL,ZESTRIL) 2.5 MG tablet Take 1 tablet (2.5 mg total) by mouth daily. 30 tablet 5  . methadone (DOLOPHINE) 10 MG/5ML solution Take 8 mg by mouth every morning.     . Multiple Vitamin (MULTIVITAMIN) tablet Take 1 tablet by mouth every morning.     . naproxen (NAPROSYN) 500 MG tablet Take 1 tablet (500 mg total) by mouth 2 (two) times daily with a meal. 60 tablet 1  . traMADol (ULTRAM) 50 MG tablet Take 1 tablet (50 mg total) by mouth 2 (two) times daily as needed. (Patient not taking: Reported on 01/19/2015) 60 tablet 0   No current facility-administered medications on file prior to visit.    No Known Allergies  Family History  Problem Relation Age of Onset  . Heart disease Father   . Diabetes Father   . Hyperlipidemia Father   . Hypertension Father   . Cancer Neg Hx     negative for colon and prostate  . Thyroid  disease Sister   . Heart disease Cousin     MI  . Diabetes Cousin   . Obesity Cousin   . Osteoporosis Mother   . Diabetes Paternal Grandfather     Social History   Social History  . Marital Status: Married    Spouse Name: N/A  . Number of Children: N/A  . Years of Education: N/A   Social History Main Topics  . Smoking status: Never Smoker   . Smokeless tobacco: Never Used  . Alcohol Use: No  . Drug Use: No  . Sexual Activity: Yes     Comment: avoids nuts, fruits, lives with wife, makes picture frames, building maintenance   Other Topics Concern  . None   Social History Narrative    Review of Systems - See HPI.  All other ROS are negative.  BP 138/88 mmHg  Pulse 69  Temp(Src) 98.1 F (36.7 C) (Oral)  Resp 16  Ht 6' (1.829 m)  Wt 166 lb 8 oz (75.524 kg)  BMI 22.58 kg/m2  SpO2 99%  Physical Exam  Constitutional: He is oriented to person, place, and time and well-developed, well-nourished, and in no distress.  HENT:  Head: Normocephalic and atraumatic.  TM within normal limits bilaterally.  Eyes: Conjunctivae are normal.  Neck: Neck supple.  Cardiovascular:  Normal rate, regular rhythm, normal heart sounds and intact distal pulses.   Pulmonary/Chest: Effort normal and breath sounds normal. No respiratory distress. He has no wheezes. He has no rales. He exhibits no tenderness.  Neurological: He is alert and oriented to person, place, and time.  Skin: Skin is warm and dry. No rash noted.  Psychiatric: Affect normal.  Vitals reviewed.   Recent Results (from the past 2160 hour(s))  Hemoglobin A1c     Status: None   Collection Time: 01/19/15 10:04 AM  Result Value Ref Range   Hgb A1c MFr Bld 5.7 4.6 - 6.5 %    Comment: Glycemic Control Guidelines for People with Diabetes:Non Diabetic:  <6%Goal of Therapy: <7%Additional Action Suggested:  >8%   CBC     Status: Abnormal   Collection Time: 01/19/15 10:04 AM  Result Value Ref Range   WBC 4.1 4.0 - 10.5 K/uL   RBC  4.94 4.22 - 5.81 Mil/uL   Platelets 129.0 (L) 150.0 - 400.0 K/uL   Hemoglobin 15.1 13.0 - 17.0 g/dL   HCT 45.0 39.0 - 52.0 %   MCV 91.0 78.0 - 100.0 fl   MCHC 33.5 30.0 - 36.0 g/dL   RDW 13.4 11.5 - 15.5 %  Comprehensive metabolic panel     Status: Abnormal   Collection Time: 01/19/15 10:04 AM  Result Value Ref Range   Sodium 140 135 - 145 mEq/L   Potassium 4.3 3.5 - 5.1 mEq/L   Chloride 102 96 - 112 mEq/L   CO2 33 (H) 19 - 32 mEq/L   Glucose, Bld 128 (H) 70 - 99 mg/dL   BUN 24 (H) 6 - 23 mg/dL   Creatinine, Ser 1.24 0.40 - 1.50 mg/dL   Total Bilirubin 0.8 0.2 - 1.2 mg/dL   Alkaline Phosphatase 56 39 - 117 U/L   AST 43 (H) 0 - 37 U/L   ALT 54 (H) 0 - 53 U/L   Total Protein 7.1 6.0 - 8.3 g/dL   Albumin 4.6 3.5 - 5.2 g/dL   Calcium 9.7 8.4 - 10.5 mg/dL   GFR 62.11 >60.00 mL/min    Assessment/Plan: Acute bacterial bronchitis Rx Doxycycline.  Increase fluids.  Rest.  Saline nasal spray.  Probiotic.  Mucinex as directed.  Humidifier in bedroom.  Call or return to clinic if symptoms are not improving.

## 2015-03-08 NOTE — Progress Notes (Signed)
Pre visit review using our clinic review tool, if applicable. No additional management support is needed unless otherwise documented below in the visit note/SLS  

## 2015-03-09 NOTE — Assessment & Plan Note (Signed)
2/2 rotator cuff impingement which has improved - now with biceps tendinitis.  He will start with home exercises for this, nsaids with tylenol as needed.  Declined PT for now - call us if he changes his mind.  F/u in 6 weeks otherwise.

## 2015-03-09 NOTE — Progress Notes (Signed)
PCP: Penni Homans, MD  Subjective:   HPI: Patient is a 64 y.o. male here for left shoulder pain.  8/16: Patient has had about 2 years of left shoulder pain. No known injury or trauma. At beginning of this he had an injection which helped. Past 2-3 months pain has really intensified. Pain 2/10 at rest, up to 5/10 with activities. Is right handed. Worse with lifting, twisting, pulling. No night pain. Worse walking the dog. Did PT previously for this shoulder.  9/21: Patient reports he is still having pain to 3/10 level. Cant pick up heavy items. Tried nitro patches but can't use due to headaches. No night pain. Seemed to hurt worse with home exercises.  Past Medical History  Diagnosis Date  . Hepatitis C, chronic   . History of intravenous drug use in remission   . Arm mass   . Arm skin lesion, left 11/12/2012    And sun damaged skin  . Elevated BP 11/14/2012  . Left shoulder pain 12/16/2012  . Preventative health care 05/14/2013  . History of oral lesions 05/14/2013  . Methadone use 01/31/2015    Current Outpatient Prescriptions on File Prior to Visit  Medication Sig Dispense Refill  . Alum & Mag Hydroxide-Simeth (MAGIC MOUTHWASH) SOLN Take 5 mLs by mouth 2 (two) times daily. 240 mL 0  . cyclobenzaprine (FLEXERIL) 10 MG tablet Take 1 tablet (10 mg total) by mouth 2 (two) times daily as needed for muscle spasms. 30 tablet 2  . fluocinonide gel (LIDEX) 1.61 % Apply 1 application topically 2 (two) times daily as needed. Applies to tongue as needed 60 g 1  . glucosamine-chondroitin 500-400 MG tablet Take 1 tablet by mouth every morning.     Marland Kitchen lisinopril (PRINIVIL,ZESTRIL) 2.5 MG tablet Take 1 tablet (2.5 mg total) by mouth daily. 30 tablet 5  . methadone (DOLOPHINE) 10 MG/5ML solution Take 8 mg by mouth every morning.     . Multiple Vitamin (MULTIVITAMIN) tablet Take 1 tablet by mouth every morning.     . naproxen (NAPROSYN) 500 MG tablet Take 1 tablet (500 mg total) by mouth 2  (two) times daily with a meal. 60 tablet 1  . traMADol (ULTRAM) 50 MG tablet Take 1 tablet (50 mg total) by mouth 2 (two) times daily as needed. (Patient not taking: Reported on 01/19/2015) 60 tablet 0   No current facility-administered medications on file prior to visit.    Past Surgical History  Procedure Laterality Date  . Knee arthroscopy  1996    ligment repair    No Known Allergies  Social History   Social History  . Marital Status: Married    Spouse Name: N/A  . Number of Children: N/A  . Years of Education: N/A   Occupational History  . Not on file.   Social History Main Topics  . Smoking status: Never Smoker   . Smokeless tobacco: Never Used  . Alcohol Use: No  . Drug Use: No  . Sexual Activity: Yes     Comment: avoids nuts, fruits, lives with wife, makes picture frames, building maintenance   Other Topics Concern  . Not on file   Social History Narrative    Family History  Problem Relation Age of Onset  . Heart disease Father   . Diabetes Father   . Hyperlipidemia Father   . Hypertension Father   . Cancer Neg Hx     negative for colon and prostate  . Thyroid disease Sister   .  Heart disease Cousin     MI  . Diabetes Cousin   . Obesity Cousin   . Osteoporosis Mother   . Diabetes Paternal Grandfather     BP 159/89 mmHg  Pulse 80  Ht 6' (1.829 m)  Wt 160 lb (72.576 kg)  BMI 21.70 kg/m2  Review of Systems: See HPI above.    Objective:  Physical Exam:  Gen: NAD  Left shoulder: No swelling, ecchymoses.  No gross deformity. TTP anterior shoulder over proximal biceps tendon. FROM with negative painful arc. Negative Hawkins, Neers. Positive yergasons, negative speeds. Strength 5/5 with empty can and resisted internal/external rotation.  Pain empty can, decreased though. Negative apprehension. NV intact distally.    Assessment & Plan:  1. Left shoulder pain - 2/2 rotator cuff impingement which has improved - now with biceps tendinitis.   He will start with home exercises for this, nsaids with tylenol as needed.  Declined PT for now - call us if he changes his mind.  F/u in 6 weeks otherwise.

## 2015-04-14 ENCOUNTER — Ambulatory Visit: Payer: Medicare Other | Admitting: Family Medicine

## 2015-06-22 ENCOUNTER — Other Ambulatory Visit: Payer: Self-pay | Admitting: Family Medicine

## 2015-07-28 ENCOUNTER — Telehealth: Payer: Self-pay | Admitting: *Deleted

## 2015-07-28 NOTE — Telephone Encounter (Signed)
Pt at work and unable to participate in Pre-Visit Call at this time. Appt confirmed w/ pt.

## 2015-07-29 ENCOUNTER — Encounter: Payer: Self-pay | Admitting: Family Medicine

## 2015-07-29 ENCOUNTER — Ambulatory Visit (INDEPENDENT_AMBULATORY_CARE_PROVIDER_SITE_OTHER): Payer: Medicare Other | Admitting: Family Medicine

## 2015-07-29 VITALS — BP 144/76 | HR 90 | Temp 98.3°F | Ht 72.0 in | Wt 172.0 lb

## 2015-07-29 DIAGNOSIS — R252 Cramp and spasm: Secondary | ICD-10-CM

## 2015-07-29 DIAGNOSIS — Z0001 Encounter for general adult medical examination with abnormal findings: Secondary | ICD-10-CM | POA: Diagnosis not present

## 2015-07-29 DIAGNOSIS — B182 Chronic viral hepatitis C: Secondary | ICD-10-CM

## 2015-07-29 DIAGNOSIS — R030 Elevated blood-pressure reading, without diagnosis of hypertension: Secondary | ICD-10-CM

## 2015-07-29 DIAGNOSIS — D696 Thrombocytopenia, unspecified: Secondary | ICD-10-CM

## 2015-07-29 DIAGNOSIS — Z8719 Personal history of other diseases of the digestive system: Secondary | ICD-10-CM

## 2015-07-29 DIAGNOSIS — M25512 Pain in left shoulder: Secondary | ICD-10-CM

## 2015-07-29 DIAGNOSIS — Z23 Encounter for immunization: Secondary | ICD-10-CM

## 2015-07-29 DIAGNOSIS — IMO0001 Reserved for inherently not codable concepts without codable children: Secondary | ICD-10-CM

## 2015-07-29 DIAGNOSIS — Z0000 Encounter for general adult medical examination without abnormal findings: Secondary | ICD-10-CM

## 2015-07-29 DIAGNOSIS — R739 Hyperglycemia, unspecified: Secondary | ICD-10-CM | POA: Diagnosis not present

## 2015-07-29 LAB — COMPREHENSIVE METABOLIC PANEL
ALK PHOS: 60 U/L (ref 39–117)
ALT: 56 U/L — AB (ref 0–53)
AST: 46 U/L — AB (ref 0–37)
Albumin: 4.6 g/dL (ref 3.5–5.2)
BILIRUBIN TOTAL: 0.6 mg/dL (ref 0.2–1.2)
BUN: 16 mg/dL (ref 6–23)
CO2: 32 mEq/L (ref 19–32)
CREATININE: 1.18 mg/dL (ref 0.40–1.50)
Calcium: 9.8 mg/dL (ref 8.4–10.5)
Chloride: 101 mEq/L (ref 96–112)
GFR: 65.66 mL/min (ref >60.00)
GLUCOSE: 93 mg/dL (ref 70–99)
Potassium: 4.6 mEq/L (ref 3.5–5.1)
Sodium: 140 mEq/L (ref 135–145)
TOTAL PROTEIN: 7.4 g/dL (ref 6.0–8.3)

## 2015-07-29 LAB — CBC
HCT: 45.8 % (ref 39.0–52.0)
HEMOGLOBIN: 15.3 g/dL (ref 13.0–17.0)
MCHC: 33.4 g/dL (ref 30.0–36.0)
MCV: 91 fl (ref 78.0–100.0)
Platelets: 136 10*3/uL — ABNORMAL LOW (ref 150.0–400.0)
RBC: 5.03 Mil/uL (ref 4.22–5.81)
RDW: 13.4 % (ref 11.5–15.5)
WBC: 3.7 10*3/uL — AB (ref 4.0–10.5)

## 2015-07-29 LAB — TSH: TSH: 0.98 u[IU]/mL (ref 0.35–4.50)

## 2015-07-29 LAB — HEMOGLOBIN A1C: HEMOGLOBIN A1C: 5.7 % (ref 4.6–6.5)

## 2015-07-29 LAB — MAGNESIUM: Magnesium: 2 mg/dL (ref 1.5–2.5)

## 2015-07-29 MED ORDER — CYCLOBENZAPRINE HCL 10 MG PO TABS: ORAL_TABLET | ORAL | Status: DC

## 2015-07-29 MED ORDER — FLUOCINONIDE 0.05 % EX GEL: 1.0000 "application " | Freq: Two times a day (BID) | CUTANEOUS | Status: DC | PRN

## 2015-07-29 MED ORDER — MAGIC MOUTHWASH: 5.0000 mL | Freq: Two times a day (BID) | ORAL | Status: DC

## 2015-07-29 NOTE — Progress Notes (Signed)
Pre visit review using our clinic review tool, if applicable. No additional management support is needed unless otherwise documented below in the visit note. 

## 2015-07-29 NOTE — Assessment & Plan Note (Signed)
Doing well with prn treatments given refills today

## 2015-07-29 NOTE — Assessment & Plan Note (Signed)
Foot cramp yesterday but this is not recurrent, reminded to increase hydration will check CMP and magnesium. He will try orthotics if continues and let us know if pain escalates

## 2015-07-29 NOTE — Assessment & Plan Note (Signed)
No complaints today, no PSA this year unless symptoms

## 2015-07-29 NOTE — Assessment & Plan Note (Addendum)
Flared with shoveling of snow during last storm is improving since he stopped holding his dog with left arm but still has pain with lifting weight over head, this is improving as well. He declines new referral for further treatment at this time but will notify us if worsens. Refilled Flexeril to use prn

## 2015-07-29 NOTE — Assessment & Plan Note (Addendum)
Brings in log from home systolic is [id number] and [id number] highest was [id number], diastolic [id number] and 0000000. He has 2 cuffs and one runs higher that the other, he will compare numbers and if he is concerned will return for nurse BP visit. Avoid sodium and caffeine

## 2015-07-29 NOTE — Progress Notes (Signed)
Subjective:    Patient ID: Shaun [name], male    DOB: [birth year], 65 y.o.   MRN: [id number]  Chief Complaint  Patient presents with  . Annual Exam    HPI Patient is in today for Annual Physical Exam and follow up on numerous medical conditions.  Patient has some concerns about elevated blood pressure, has records from the last month showing his pressure has been running between 120-140/80-100.   Patient having some complaints of shoulder pain on both but more pain on the left shoulder more pain with lifting and movement.  Mild pain with no movement.  Patient having some occasional foot cramps, patient states he drinks enough water. Patient complains of having some mild hearing difficulties.  Denies CP/palp/SOB/HA/congestion/fevers/GI or GU c/o. Taking med's as prescribed. Still having oral lesions intermittently but responds to current treatments   Past Medical History  Diagnosis Date  . Hepatitis C, chronic (HCC)   . History of intravenous drug use in remission   . Arm mass   . Arm skin lesion, left 11/12/2012    And sun damaged skin  . Elevated BP 11/14/2012  . Left shoulder pain 12/16/2012  . Preventative health care 05/14/2013  . History of oral lesions 05/14/2013  . Methadone use (HCC) 01/31/2015  . Myalgia and myositis 08/13/2014  . History of oral lesions 07/29/2015    Past Surgical History  Procedure Laterality Date  . Knee arthroscopy  1996    ligment repair    Family History  Problem Relation Age of Onset  . Heart disease Father   . Diabetes Father   . Hyperlipidemia Father   . Hypertension Father   . Cancer Neg Hx     negative for colon and prostate  . Thyroid disease Sister   . Heart disease Cousin     MI  . Diabetes Cousin   . Obesity Cousin   . Osteoporosis Mother   . Diabetes Paternal Grandfather   . Thyroid disease Maternal Grandmother     Social History   Social History  . Marital Status: Married    Spouse Name: N/A  . Number of Children: N/A  .  Years of Education: N/A   Occupational History  . Not on file.   Social History Main Topics  . Smoking status: Never Smoker   . Smokeless tobacco: Never Used  . Alcohol Use: No  . Drug Use: No  . Sexual Activity: Yes     Comment: avoids nuts, fruits, lives with wife, makes picture frames, building maintenance   Other Topics Concern  . Not on file   Social History Narrative    Outpatient Prescriptions Prior to Visit  Medication Sig Dispense Refill  . methadone (DOLOPHINE) 10 MG/5ML solution Take 8 mg by mouth every morning.     . Multiple Vitamin (MULTIVITAMIN) tablet Take 1 tablet by mouth every morning.     . naproxen (NAPROSYN) 500 MG tablet Take 1 tablet (500 mg total) by mouth 2 (two) times daily with a meal. 60 tablet 1  . Alum & Mag Hydroxide-Simeth (MAGIC MOUTHWASH) SOLN Take 5 mLs by mouth 2 (two) times daily. 240 mL 0  . cyclobenzaprine (FLEXERIL) 10 MG tablet Take ONE (1) tablet by mouth twice daily as needed for MUSCLE SPASMS 30 tablet 1  . fluocinonide gel (LIDEX) 0.05 % Apply 1 application topically 2 (two) times daily as needed. Applies to tongue as needed 60 g 1  . glucosamine-chondroitin 500-400 MG tablet Take 1 tablet by  mouth every morning.     Marland Kitchen lisinopril (PRINIVIL,ZESTRIL) 2.5 MG tablet Take 1 tablet (2.5 mg total) by mouth daily. (Patient not taking: Reported on 07/29/2015) 30 tablet 5  . doxycycline (VIBRAMYCIN) 100 MG capsule Take 1 capsule (100 mg total) by mouth 2 (two) times daily. 14 capsule 0  . traMADol (ULTRAM) 50 MG tablet Take 1 tablet (50 mg total) by mouth 2 (two) times daily as needed. (Patient not taking: Reported on 01/19/2015) 60 tablet 0   No facility-administered medications prior to visit.    No Known Allergies  Review of Systems  Constitutional: Negative for fever and malaise/fatigue.  HENT: Negative for congestion.   Eyes: Negative for discharge.  Respiratory: Negative for shortness of breath.   Cardiovascular: Negative for chest  pain, palpitations and leg swelling.  Gastrointestinal: Negative for nausea and abdominal pain.  Genitourinary: Negative for dysuria.  Musculoskeletal: Positive for myalgias and joint pain. Negative for falls.  Skin: Negative for rash.  Neurological: Negative for loss of consciousness and headaches.  Endo/Heme/Allergies: Negative for environmental allergies.  Psychiatric/Behavioral: Negative for depression. The patient is not nervous/anxious.        Objective:    Physical Exam  Constitutional: He is oriented to person, place, and time. He appears well-developed and well-nourished. No distress.  HENT:  Head: Normocephalic and atraumatic.  Eyes: Conjunctivae are normal.  Neck: Neck supple. No thyromegaly present.  Cardiovascular: Normal rate, regular rhythm and normal heart sounds.   No murmur heard. Pulmonary/Chest: Effort normal and breath sounds normal. No respiratory distress. He has no wheezes.  Abdominal: Soft. Bowel sounds are normal. He exhibits no mass. There is no tenderness.  Musculoskeletal: He exhibits no edema.  Lymphadenopathy:    He has no cervical adenopathy.  Neurological: He is alert and oriented to person, place, and time.  Skin: Skin is warm and dry.  seborrhae Keratosis on back no concerning lesions  Psychiatric: He has a normal mood and affect. His behavior is normal.    BP 144/76 mmHg  Pulse 90  Temp(Src) 98.3 F (36.8 C) (Oral)  Ht 6' (1.829 m)  Wt 172 lb (78.019 kg)  BMI 23.32 kg/m2  SpO2 97% Wt Readings from Last 3 Encounters:  07/29/15 172 lb (78.019 kg)  03/08/15 166 lb 8 oz (75.524 kg)  03/03/15 160 lb (72.576 kg)     Lab Results  Component Value Date   WBC 4.1 01/19/2015   HGB 15.1 01/19/2015   HCT 45.0 01/19/2015   PLT 129.0* 01/19/2015   GLUCOSE 128* 01/19/2015   CHOL 110 01/13/2014   TRIG 53 01/13/2014   HDL 53 01/13/2014   LDLCALC 46 01/13/2014   ALT 54* 01/19/2015   AST 43* 01/19/2015   NA 140 01/19/2015   K 4.3 01/19/2015    CL 102 01/19/2015   CREATININE 1.24 01/19/2015   BUN 24* 01/19/2015   CO2 33* 01/19/2015   TSH 1.28 07/21/2014   PSA 0.76 07/21/2014   INR 1.05 07/04/2012   HGBA1C 5.7 01/19/2015    Lab Results  Component Value Date   TSH 1.28 07/21/2014   Lab Results  Component Value Date   WBC 4.1 01/19/2015   HGB 15.1 01/19/2015   HCT 45.0 01/19/2015   MCV 91.0 01/19/2015   PLT 129.0* 01/19/2015   Lab Results  Component Value Date   NA 140 01/19/2015   K 4.3 01/19/2015   CO2 33* 01/19/2015   GLUCOSE 128* 01/19/2015   BUN 24* 01/19/2015   CREATININE  1.24 01/19/2015   BILITOT 0.8 01/19/2015   ALKPHOS 56 01/19/2015   AST 43* 01/19/2015   ALT 54* 01/19/2015   PROT 7.1 01/19/2015   ALBUMIN 4.6 01/19/2015   CALCIUM 9.7 01/19/2015   GFR 62.11 01/19/2015   Lab Results  Component Value Date   CHOL 110 01/13/2014   Lab Results  Component Value Date   HDL 53 01/13/2014   Lab Results  Component Value Date   LDLCALC 46 01/13/2014   Lab Results  Component Value Date   TRIG 53 01/13/2014   Lab Results  Component Value Date   CHOLHDL 2.1 01/13/2014   Lab Results  Component Value Date   HGBA1C 5.7 01/19/2015       Assessment & Plan:   Problem List Items Addressed This Visit    Chronic hepatitis C virus infection (HCC)    Reports last biopsy was stage 1 it was 4-5 years ago. He is offered referral to here his options for new treatments but declines referral for now      Relevant Medications   magic mouthwash SOLN   Elevated BP    Brings in log from home systolic is 120s and 130s highest was 140, diastolic 80s and 29B. He has 2 cuffs and one runs higher that the other, he will compare numbers and if he is concerned will return for nurse BP visit. Avoid sodium and caffeine      Relevant Orders   TSH   CBC   Comprehensive metabolic panel   History of oral lesions    Doing well with prn treatments given refills today      Relevant Medications   fluocinonide gel  (LIDEX) 0.05 %   Hyperglycemia    hgba1c acceptable, minimize simple carbs. Increase exercise as tolerated. Repeat hgba1c      Relevant Orders   HgB A1c   Left shoulder pain    Flared with shoveling of snow during last storm is improving since he stopped holding his dog with left arm but still has pain with lifting weight over head, this is improving as well. He declines new referral for further treatment at this time but will notify us if worsens. Refilled Flexeril to use prn      Myalgia and myositis    Foot cramp yesterday but this is not recurrent, reminded to increase hydration will check CMP and magnesium. He will try orthotics if continues and let us know if pain escalates      Preventative health care    Annual exam doing well, given flu shot and Prevnar. Patient encouraged to maintain heart healthy diet, regular exercise, adequate sleep. Consider daily probiotics. Take medications as prescribed. Has ACP encouraged to bring to office.      Thrombocytopenia (HCC)    Repeat CBC today       Other Visit Diagnoses    Need for vaccination with 13-polyvalent pneumococcal conjugate vaccine    -  Primary    Relevant Orders    Pneumococcal conjugate vaccine 13-valent IM (Completed)    Encounter for immunization        Relevant Orders    Flu Vaccine QUAD 36+ mos IM    Foot cramps        Relevant Orders    Magnesium       I have discontinued Mr. Klecha glucosamine-chondroitin, traMADol, and doxycycline. I have also changed his magic mouthwash. Additionally, I am having him maintain his methadone, multivitamin, naproxen, lisinopril, fluocinonide gel, and cyclobenzaprine.  Meds ordered this encounter  Medications  . magic mouthwash SOLN    Sig: Take 5 mLs by mouth 2 (two) times daily.    Dispense:  240 mL    Refill:  0  . fluocinonide gel (LIDEX) 0.05 %    Sig: Apply 1 application topically 2 (two) times daily as needed. Applies to tongue as needed    Dispense:  60 g      Refill:  1  . cyclobenzaprine (FLEXERIL) 10 MG tablet    Sig: Take ONE (1) tablet by mouth twice daily as needed for MUSCLE SPASMS    Dispense:  30 tablet    Refill:  1     Tyhir Schwan, MD

## 2015-07-29 NOTE — Assessment & Plan Note (Signed)
Annual exam doing well, given flu shot and Prevnar. Patient encouraged to maintain heart healthy diet, regular exercise, adequate sleep. Consider daily probiotics. Take medications as prescribed. Has ACP encouraged to bring to office.

## 2015-07-29 NOTE — Assessment & Plan Note (Signed)
Repeat CBC today 

## 2015-07-29 NOTE — Assessment & Plan Note (Signed)
Reports last biopsy was stage 1 it was 4-5 years ago. He is offered referral to here his options for new treatments but declines referral for now

## 2015-07-29 NOTE — Patient Instructions (Signed)
Preventive Care for Adults, Male A healthy lifestyle and preventive care can promote health and wellness. Preventive health guidelines for men include the following key practices:  A routine yearly physical is a good way to check with your health care provider about your health and preventative screening. It is a chance to share any concerns and updates on your health and to receive a thorough exam.  Visit your dentist for a routine exam and preventative care every 6 months. Brush your teeth twice a day and floss once a day. Good oral hygiene prevents tooth decay and gum disease.  The frequency of eye exams is based on your age, health, family medical history, use of contact lenses, and other factors. Follow your health care provider's recommendations for frequency of eye exams.  Eat a healthy diet. Foods such as vegetables, fruits, whole grains, low-fat dairy products, and lean protein foods contain the nutrients you need without too many calories. Decrease your intake of foods high in solid fats, added sugars, and salt. Eat the right amount of calories for you.Get information about a proper diet from your health care provider, if necessary.  Regular physical exercise is one of the most important things you can do for your health. Most adults should get at least 150 minutes of moderate-intensity exercise (any activity that increases your heart rate and causes you to sweat) each week. In addition, most adults need muscle-strengthening exercises on 2 or more days a week.  Maintain a healthy weight. The body mass index (BMI) is a screening tool to identify possible weight problems. It provides an estimate of body fat based on height and weight. Your health care provider can find your BMI and can help you achieve or maintain a healthy weight.For adults 20 years and older:  A BMI below 18.5 is considered underweight.  A BMI of 18.5 to 24.9 is normal.  A BMI of 25 to 29.9 is considered  overweight.  A BMI of 30 and above is considered obese.  Maintain normal blood lipids and cholesterol levels by exercising and minimizing your intake of saturated fat. Eat a balanced diet with plenty of fruit and vegetables. Blood tests for lipids and cholesterol should begin at age 20 and be repeated every 5 years. If your lipid or cholesterol levels are high, you are over 50, or you are at high risk for heart disease, you may need your cholesterol levels checked more frequently.Ongoing high lipid and cholesterol levels should be treated with medicines if diet and exercise are not working.  If you smoke, find out from your health care provider how to quit. If you do not use tobacco, do not start.  Lung cancer screening is recommended for adults aged 55-80 years who are at high risk for developing lung cancer because of a history of smoking. A yearly low-dose CT scan of the lungs is recommended for people who have at least a 30-pack-year history of smoking and are a current smoker or have quit within the past 15 years. A pack year of smoking is smoking an average of 1 pack of cigarettes a day for 1 year (for example: 1 pack a day for 30 years or 2 packs a day for 15 years). Yearly screening should continue until the smoker has stopped smoking for at least 15 years. Yearly screening should be stopped for people who develop a health problem that would prevent them from having lung cancer treatment.  If you choose to drink alcohol, do not have more   than 2 drinks per day. One drink is considered to be 12 ounces (355 mL) of beer, 5 ounces (148 mL) of wine, or 1.5 ounces (44 mL) of liquor.  Avoid use of street drugs. Do not share needles with anyone. Ask for help if you need support or instructions about stopping the use of drugs.  High blood pressure causes heart disease and increases the risk of stroke. Your blood pressure should be checked at least every 1-2 years. Ongoing high blood pressure should be  treated with medicines, if weight loss and exercise are not effective.  If you are 45-79 years old, ask your health care provider if you should take aspirin to prevent heart disease.  Diabetes screening is done by taking a blood sample to check your blood glucose level after you have not eaten for a certain period of time (fasting). If you are not overweight and you do not have risk factors for diabetes, you should be screened once every 3 years starting at age 45. If you are overweight or obese and you are 40-70 years of age, you should be screened for diabetes every year as part of your cardiovascular risk assessment.  Colorectal cancer can be detected and often prevented. Most routine colorectal cancer screening begins at the age of 50 and continues through age 75. However, your health care provider may recommend screening at an earlier age if you have risk factors for colon cancer. On a yearly basis, your health care provider may provide home test kits to check for hidden blood in the stool. Use of a small camera at the end of a tube to directly examine the colon (sigmoidoscopy or colonoscopy) can detect the earliest forms of colorectal cancer. Talk to your health care provider about this at age 50, when routine screening begins. Direct exam of the colon should be repeated every 5-10 years through age 75, unless early forms of precancerous polyps or small growths are found.  People who are at an increased risk for hepatitis B should be screened for this virus. You are considered at high risk for hepatitis B if:  You were born in a country where hepatitis B occurs often. Talk with your health care provider about which countries are considered high risk.  Your parents were born in a high-risk country and you have not received a shot to protect against hepatitis B (hepatitis B vaccine).  You have HIV or AIDS.  You use needles to inject street drugs.  You live with, or have sex with, someone who  has hepatitis B.  You are a man who has sex with other men (MSM).  You get hemodialysis treatment.  You take certain medicines for conditions such as cancer, organ transplantation, and autoimmune conditions.  Hepatitis C blood testing is recommended for all people born from 1945 through 1965 and any individual with known risks for hepatitis C.  Practice safe sex. Use condoms and avoid high-risk sexual practices to reduce the spread of sexually transmitted infections (STIs). STIs include gonorrhea, chlamydia, syphilis, trichomonas, herpes, HPV, and human immunodeficiency virus (HIV). Herpes, HIV, and HPV are viral illnesses that have no cure. They can result in disability, cancer, and death.  If you are a man who has sex with other men, you should be screened at least once per year for:  HIV.  Urethral, rectal, and pharyngeal infection of gonorrhea, chlamydia, or both.  If you are at risk of being infected with HIV, it is recommended that you take a   a prescription medicine daily to prevent HIV infection. This is called preexposure prophylaxis (PrEP). You are considered at risk if:  You are a man who has sex with other men (MSM) and have other risk factors.  You are a heterosexual man, are sexually active, and are at increased risk for HIV infection.  You take drugs by injection.  You are sexually active with a partner who has HIV.  Talk with your health care provider about whether you are at high risk of being infected with HIV. If you choose to begin PrEP, you should first be tested for HIV. You should then be tested every 3 months for as long as you are taking PrEP.  A one-time screening for abdominal aortic aneurysm (AAA) and surgical repair of large AAAs by ultrasound are recommended for men ages 31 to 16 years who are current or former smokers.  Healthy men should no longer receive prostate-specific antigen (PSA) blood tests as part of routine cancer screening. Talk with your health  care provider about prostate cancer screening.  Testicular cancer screening is not recommended for adult males who have no symptoms. Screening includes self-exam, a health care provider exam, and other screening tests. Consult with your health care provider about any symptoms you have or any concerns you have about testicular cancer.  Use sunscreen. Apply sunscreen liberally and repeatedly throughout the day. You should seek shade when your shadow is shorter than you. Protect yourself by wearing long sleeves, pants, a wide-brimmed hat, and sunglasses year round, whenever you are outdoors.  Once a month, do a whole-body skin exam, using a mirror to look at the skin on your back. Tell your health care provider about new moles, moles that have irregular borders, moles that are larger than a pencil eraser, or moles that have changed in shape or color.  Stay current with required vaccines (immunizations).  Influenza vaccine. All adults should be immunized every year.  Tetanus, diphtheria, and acellular pertussis (Td, Tdap) vaccine. An adult who has not previously received Tdap or who does not know his vaccine status should receive 1 dose of Tdap. This initial dose should be followed by tetanus and diphtheria toxoids (Td) booster doses every 10 years. Adults with an unknown or incomplete history of completing a 3-dose immunization series with Td-containing vaccines should begin or complete a primary immunization series including a Tdap dose. Adults should receive a Td booster every 10 years.  Varicella vaccine. An adult without evidence of immunity to varicella should receive 2 doses or a second dose if he has previously received 1 dose.  Human papillomavirus (HPV) vaccine. Males aged 11-21 years who have not received the vaccine previously should receive the 3-dose series. Males aged 22-26 years may be immunized. Immunization is recommended through the age of 45 years for any male who has sex with males  and did not get any or all doses earlier. Immunization is recommended for any person with an immunocompromised condition through the age of 31 years if he did not get any or all doses earlier. During the 3-dose series, the second dose should be obtained 4-8 weeks after the first dose. The third dose should be obtained 24 weeks after the first dose and 16 weeks after the second dose.  Zoster vaccine. One dose is recommended for adults aged 26 years or older unless certain conditions are present.  Measles, mumps, and rubella (MMR) vaccine. Adults born before [birth year] generally are considered immune to measles and mumps. Adults born in  1958 or later should have 1 or more doses of MMR vaccine unless there is a contraindication to the vaccine or there is laboratory evidence of immunity to each of the three diseases. A routine second dose of MMR vaccine should be obtained at least 28 days after the first dose for students attending postsecondary schools, health care workers, or international travelers. People who received inactivated measles vaccine or an unknown type of measles vaccine during 1963-1967 should receive 2 doses of MMR vaccine. People who received inactivated mumps vaccine or an unknown type of mumps vaccine before 1979 and are at high risk for mumps infection should consider immunization with 2 doses of MMR vaccine. Unvaccinated health care workers born before [birth year] who lack laboratory evidence of measles, mumps, or rubella immunity or laboratory confirmation of disease should consider measles and mumps immunization with 2 doses of MMR vaccine or rubella immunization with 1 dose of MMR vaccine.  Pneumococcal 13-valent conjugate (PCV13) vaccine. When indicated, a person who is uncertain of his immunization history and has no record of immunization should receive the PCV13 vaccine. All adults 42 years of age and older should receive this vaccine. An adult aged 90 years or older who has certain medical  conditions and has not been previously immunized should receive 1 dose of PCV13 vaccine. This PCV13 should be followed with a dose of pneumococcal polysaccharide (PPSV23) vaccine. Adults who are at high risk for pneumococcal disease should obtain the PPSV23 vaccine at least 8 weeks after the dose of PCV13 vaccine. Adults older than 66 years of age who have normal immune system function should obtain the PPSV23 vaccine dose at least 1 year after the dose of PCV13 vaccine.  Pneumococcal polysaccharide (PPSV23) vaccine. When PCV13 is also indicated, PCV13 should be obtained first. All adults aged 70 years and older should be immunized. An adult younger than age 71 years who has certain medical conditions should be immunized. Any person who resides in a nursing home or long-term care facility should be immunized. An adult smoker should be immunized. People with an immunocompromised condition and certain other conditions should receive both PCV13 and PPSV23 vaccines. People with human immunodeficiency virus (HIV) infection should be immunized as soon as possible after diagnosis. Immunization during chemotherapy or radiation therapy should be avoided. Routine use of PPSV23 vaccine is not recommended for American Indians, [place] Natives, or people younger than 65 years unless there are medical conditions that require PPSV23 vaccine. When indicated, people who have unknown immunization and have no record of immunization should receive PPSV23 vaccine. One-time revaccination 5 years after the first dose of PPSV23 is recommended for people aged 19-64 years who have chronic kidney failure, nephrotic syndrome, asplenia, or immunocompromised conditions. People who received 1-2 doses of PPSV23 before age 77 years should receive another dose of PPSV23 vaccine at age 59 years or later if at least 5 years have passed since the previous dose. Doses of PPSV23 are not needed for people immunized with PPSV23 at or after age 61  years.  Meningococcal vaccine. Adults with asplenia or persistent complement component deficiencies should receive 2 doses of quadrivalent meningococcal conjugate (MenACWY-D) vaccine. The doses should be obtained at least 2 months apart. Microbiologists working with certain meningococcal bacteria, [employer] recruits, people at risk during an outbreak, and people who travel to or live in countries with a high rate of meningitis should be immunized. A first-year college student up through age 48 years who is living in a residence hall should receive  a dose if he did not receive a dose on or after his 16th birthday. Adults who have certain high-risk conditions should receive one or more doses of vaccine.  Hepatitis A vaccine. Adults who wish to be protected from this disease, have chronic liver disease, work with hepatitis A-infected animals, work in hepatitis A research labs, or travel to or work in countries with a high rate of hepatitis A should be immunized. Adults who were previously unvaccinated and who anticipate close contact with an international adoptee during the first 60 days after arrival in the [place] from a country with a high rate of hepatitis A should be immunized.  Hepatitis B vaccine. Adults should be immunized if they wish to be protected from this disease, are under age 91 years and have diabetes, have chronic liver disease, have had more than one sex partner in the past 6 months, may be exposed to blood or other infectious body fluids, are household contacts or sex partners of hepatitis B positive people, are clients or workers in certain care facilities, or travel to or work in countries with a high rate of hepatitis B.  Haemophilus influenzae type b (Hib) vaccine. A previously unvaccinated person with asplenia or sickle cell disease or having a scheduled splenectomy should receive 1 dose of Hib vaccine. Regardless of previous immunization, a recipient of a hematopoietic stem cell  transplant should receive a 3-dose series 6-12 months after his successful transplant. Hib vaccine is not recommended for adults with HIV infection. Preventive Service / Frequency Ages 82 to 41  Blood pressure check.** / Every 3-5 years.  Lipid and cholesterol check.** / Every 5 years beginning at age 45.  Hepatitis C blood test.** / For any individual with known risks for hepatitis C.  Skin self-exam. / Monthly.  Influenza vaccine. / Every year.  Tetanus, diphtheria, and acellular pertussis (Tdap, Td) vaccine.** / Consult your health care provider. 1 dose of Td every 10 years.  Varicella vaccine.** / Consult your health care provider.  HPV vaccine. / 3 doses over 6 months, if 72 or younger.  Measles, mumps, rubella (MMR) vaccine.** / You need at least 1 dose of MMR if you were born in 1957 or later. You may also need a second dose.  Pneumococcal 13-valent conjugate (PCV13) vaccine.** / Consult your health care provider.  Pneumococcal polysaccharide (PPSV23) vaccine.** / 1 to 2 doses if you smoke cigarettes or if you have certain conditions.  Meningococcal vaccine.** / 1 dose if you are age 69 to 66 years and a Market researcher living in a residence hall, or have one of several medical conditions. You may also need additional booster doses.  Hepatitis A vaccine.** / Consult your health care provider.  Hepatitis B vaccine.** / Consult your health care provider.  Haemophilus influenzae type b (Hib) vaccine.** / Consult your health care provider. Ages 71 to 25  Blood pressure check.** / Every year.  Lipid and cholesterol check.** / Every 5 years beginning at age 29.  Lung cancer screening. / Every year if you are aged 37-80 years and have a 30-pack-year history of smoking and currently smoke or have quit within the past 15 years. Yearly screening is stopped once you have quit smoking for at least 15 years or develop a health problem that would prevent you from having  lung cancer treatment.  Fecal occult blood test (FOBT) of stool. / Every year beginning at age 54 and continuing until age 15. You may not have to  this test if you get a colonoscopy every 10 years.  Flexible sigmoidoscopy** or colonoscopy.** / Every 5 years for a flexible sigmoidoscopy or every 10 years for a colonoscopy beginning at age 50 and continuing until age 75.  Hepatitis C blood test.** / For all people born from 1945 through 1965 and any individual with known risks for hepatitis C.  Skin self-exam. / Monthly.  Influenza vaccine. / Every year.  Tetanus, diphtheria, and acellular pertussis (Tdap/Td) vaccine.** / Consult your health care provider. 1 dose of Td every 10 years.  Varicella vaccine.** / Consult your health care provider.  Zoster vaccine.** / 1 dose for adults aged 60 years or older.  Measles, mumps, rubella (MMR) vaccine.** / You need at least 1 dose of MMR if you were born in 1957 or later. You may also need a second dose.  Pneumococcal 13-valent conjugate (PCV13) vaccine.** / Consult your health care provider.  Pneumococcal polysaccharide (PPSV23) vaccine.** / 1 to 2 doses if you smoke cigarettes or if you have certain conditions.  Meningococcal vaccine.** / Consult your health care provider.  Hepatitis A vaccine.** / Consult your health care provider.  Hepatitis B vaccine.** / Consult your health care provider.  Haemophilus influenzae type b (Hib) vaccine.** / Consult your health care provider. Ages 65 and over  Blood pressure check.** / Every year.  Lipid and cholesterol check.**/ Every 5 years beginning at age 20.  Lung cancer screening. / Every year if you are aged 55-80 years and have a 30-pack-year history of smoking and currently smoke or have quit within the past 15 years. Yearly screening is stopped once you have quit smoking for at least 15 years or develop a health problem that would prevent you from having lung cancer treatment.  Fecal  occult blood test (FOBT) of stool. / Every year beginning at age 50 and continuing until age 75. You may not have to do this test if you get a colonoscopy every 10 years.  Flexible sigmoidoscopy** or colonoscopy.** / Every 5 years for a flexible sigmoidoscopy or every 10 years for a colonoscopy beginning at age 50 and continuing until age 75.  Hepatitis C blood test.** / For all people born from 1945 through 1965 and any individual with known risks for hepatitis C.  Abdominal aortic aneurysm (AAA) screening.** / A one-time screening for ages 65 to 75 years who are current or former smokers.  Skin self-exam. / Monthly.  Influenza vaccine. / Every year.  Tetanus, diphtheria, and acellular pertussis (Tdap/Td) vaccine.** / 1 dose of Td every 10 years.  Varicella vaccine.** / Consult your health care provider.  Zoster vaccine.** / 1 dose for adults aged 60 years or older.  Pneumococcal 13-valent conjugate (PCV13) vaccine.** / 1 dose for all adults aged 65 years and older.  Pneumococcal polysaccharide (PPSV23) vaccine.** / 1 dose for all adults aged 65 years and older.  Meningococcal vaccine.** / Consult your health care provider.  Hepatitis A vaccine.** / Consult your health care provider.  Hepatitis B vaccine.** / Consult your health care provider.  Haemophilus influenzae type b (Hib) vaccine.** / Consult your health care provider. **Family history and personal history of risk and conditions may change your health care provider's recommendations.   This information is not intended to replace advice given to you by your health care provider. Make sure you discuss any questions you have with your health care provider.   Document Released: 07/25/2001 Document Revised: 06/19/2014 Document Reviewed: 10/24/2010 Elsevier Interactive Patient Education 2016   2016 [employer].

## 2015-07-29 NOTE — Assessment & Plan Note (Signed)
hgba1c acceptable, minimize simple carbs. Increase exercise as tolerated. Repeat hgba1c 

## 2015-10-27 ENCOUNTER — Other Ambulatory Visit: Payer: Self-pay | Admitting: Family Medicine

## 2015-10-27 ENCOUNTER — Other Ambulatory Visit: Payer: Self-pay | Admitting: Emergency Medicine

## 2015-10-27 NOTE — Telephone Encounter (Signed)
OK to refill Magic Mouthwash, same sig, 240 mg

## 2015-10-27 NOTE — Telephone Encounter (Signed)
Pt is requesting refill on Magic Mouthwash.  Last OV: 07/29/2015 Last Fill: 07/29/2015 #[id number] and [id number]   Please advise.

## 2015-10-28 MED ORDER — MAGIC MOUTHWASH: 5.0000 mL | Freq: Two times a day (BID) | ORAL | Status: DC

## 2015-10-28 NOTE — Telephone Encounter (Signed)
c 

## 2016-01-27 ENCOUNTER — Encounter: Payer: Self-pay | Admitting: Family Medicine

## 2016-01-27 ENCOUNTER — Ambulatory Visit (HOSPITAL_BASED_OUTPATIENT_CLINIC_OR_DEPARTMENT_OTHER)
Admission: RE | Admit: 2016-01-27 | Discharge: 2016-01-27 | Disposition: A | Discharge status: Home / Self Care | Payer: Medicare Other | Source: Ambulatory Visit | Attending: Family Medicine | Admitting: Family Medicine

## 2016-01-27 ENCOUNTER — Ambulatory Visit (INDEPENDENT_AMBULATORY_CARE_PROVIDER_SITE_OTHER): Payer: Medicare Other | Admitting: Family Medicine

## 2016-01-27 VITALS — BP 142/82 | HR 76 | Temp 98.5°F | Ht 72.0 in | Wt 168.1 lb

## 2016-01-27 DIAGNOSIS — M791 Myalgia: Secondary | ICD-10-CM

## 2016-01-27 DIAGNOSIS — D696 Thrombocytopenia, unspecified: Secondary | ICD-10-CM | POA: Diagnosis not present

## 2016-01-27 DIAGNOSIS — M545 Low back pain: Secondary | ICD-10-CM | POA: Diagnosis not present

## 2016-01-27 DIAGNOSIS — R739 Hyperglycemia, unspecified: Secondary | ICD-10-CM

## 2016-01-27 DIAGNOSIS — Z0000 Encounter for general adult medical examination without abnormal findings: Secondary | ICD-10-CM

## 2016-01-27 DIAGNOSIS — M5136 Other intervertebral disc degeneration, lumbar region: Secondary | ICD-10-CM | POA: Diagnosis not present

## 2016-01-27 DIAGNOSIS — R030 Elevated blood-pressure reading, without diagnosis of hypertension: Secondary | ICD-10-CM

## 2016-01-27 DIAGNOSIS — IMO0001 Reserved for inherently not codable concepts without codable children: Secondary | ICD-10-CM

## 2016-01-27 DIAGNOSIS — Z8719 Personal history of other diseases of the digestive system: Secondary | ICD-10-CM | POA: Diagnosis not present

## 2016-01-27 DIAGNOSIS — L578 Other skin changes due to chronic exposure to nonionizing radiation: Secondary | ICD-10-CM

## 2016-01-27 DIAGNOSIS — B182 Chronic viral hepatitis C: Secondary | ICD-10-CM | POA: Diagnosis not present

## 2016-01-27 DIAGNOSIS — K219 Gastro-esophageal reflux disease without esophagitis: Secondary | ICD-10-CM

## 2016-01-27 DIAGNOSIS — M609 Myositis, unspecified: Secondary | ICD-10-CM

## 2016-01-27 DIAGNOSIS — S3992XA Unspecified injury of lower back, initial encounter: Secondary | ICD-10-CM | POA: Diagnosis not present

## 2016-01-27 HISTORY — DX: Gastro-esophageal reflux disease without esophagitis: K21.9

## 2016-01-27 HISTORY — DX: Encounter for general adult medical examination without abnormal findings: Z00.00

## 2016-01-27 LAB — LIPID PANEL
CHOL/HDL RATIO: 2
CHOLESTEROL: 126 mg/dL (ref 0–200)
HDL: 57 mg/dL (ref >39.00)
LDL CALC: 53 mg/dL (ref 0–99)
NonHDL: 69.33
TRIGLYCERIDES: 82 mg/dL (ref 0.0–149.0)
VLDL: 16.4 mg/dL (ref 0.0–40.0)

## 2016-01-27 LAB — COMPREHENSIVE METABOLIC PANEL
ALBUMIN: 4.7 g/dL (ref 3.5–5.2)
ALT: 45 U/L (ref 0–53)
AST: 40 U/L — AB (ref 0–37)
Alkaline Phosphatase: 57 U/L (ref 39–117)
BUN: 18 mg/dL (ref 6–23)
CALCIUM: 9.8 mg/dL (ref 8.4–10.5)
CHLORIDE: 101 meq/L (ref 96–112)
CO2: 32 meq/L (ref 19–32)
CREATININE: 1.22 mg/dL (ref 0.40–1.50)
GFR: 63.09 mL/min (ref >60.00)
Glucose, Bld: 103 mg/dL — ABNORMAL HIGH (ref 70–99)
POTASSIUM: 5.2 meq/L — AB (ref 3.5–5.1)
SODIUM: 140 meq/L (ref 135–145)
Total Bilirubin: 0.6 mg/dL (ref 0.2–1.2)
Total Protein: 7.3 g/dL (ref 6.0–8.3)

## 2016-01-27 LAB — HEMOGLOBIN A1C: Hgb A1c MFr Bld: 5.7 % (ref 4.6–6.5)

## 2016-01-27 LAB — CBC
HEMATOCRIT: 44.6 % (ref 39.0–52.0)
Hemoglobin: 15.1 g/dL (ref 13.0–17.0)
MCHC: 33.8 g/dL (ref 30.0–36.0)
MCV: 91.3 fl (ref 78.0–100.0)
Platelets: 129 10*3/uL — ABNORMAL LOW (ref 150.0–400.0)
RBC: 4.89 Mil/uL (ref 4.22–5.81)
RDW: 12.9 % (ref 11.5–15.5)
WBC: 4 10*3/uL (ref 4.0–10.5)

## 2016-01-27 LAB — TSH: TSH: 1.47 u[IU]/mL (ref 0.35–4.50)

## 2016-01-27 MED ORDER — MAGIC MOUTHWASH: 5.0000 mL | Freq: Two times a day (BID) | ORAL | 1 refills | Status: DC

## 2016-01-27 MED ORDER — FLUOCINONIDE 0.05 % EX GEL: 1.0000 "application " | Freq: Two times a day (BID) | CUTANEOUS | 1 refills | Status: DC | PRN

## 2016-01-27 MED ORDER — RANITIDINE HCL 300 MG PO TABS: 150.0000 mg | ORAL_TABLET | Freq: Every evening | ORAL | 3 refills | Status: DC | PRN

## 2016-01-27 NOTE — Assessment & Plan Note (Addendum)
Patient denies any difficulties at home. No trouble with ADLs, depression or falls. See EMR for functional status screen and depression screen. No recent changes to vision or hearing. Is UTD with immunizations. Is UTD with screening. Discussed Advanced Directives. Encouraged heart healthy diet, exercise as tolerated and adequate sleep. See patient's problem list for health risk factors to monitor. See AVS for preventative healthcare recommendation schedule.Agrees to bring up AD paperwork, labs ordered today

## 2016-01-27 NOTE — Assessment & Plan Note (Signed)
Is ready to go back and see Dr Fredrich Birks whom he saw roughly 6 years ago when he was told his disease was still stage one. Referral placed

## 2016-01-27 NOTE — Progress Notes (Signed)
Patient ID: Shaun [name], male   DOB: 1951/02/09, 65 y.o.   MRN: [id number]   Subjective:    Patient ID: Shaun [name], male    DOB: 09-Oct-1950, 65 y.o.   MRN: [id number]  Chief Complaint  Patient presents with  . Medicare Wellness    HPI Patient is in today for wellness exam and follow up on chronic medical conditions. After careful consideration he has decided he is willing to accept a referral back to his gastroenterologist, Dr Fredrich Birks who did a biopsy 6 years ago which reportedly showed stage 1 disease. Patient is asymptomatic. Otherwise he feels well although he does acknowledge he did do some heavy lifting last week that flared his low back pain, no radiculopathy or incontinence. It is improving. He also endorses intermittent heartburn that responds to OTC antacids but recurs. Still also notes mucus and PND. No pruritus or recent illness. Denies CP/palp/SOB/HA/fevers or GU c/o. Taking meds as prescribed  Past Medical History:  Diagnosis Date  . Arm mass   . Arm skin lesion, left 11/12/2012   And sun damaged skin  . Elevated BP 11/14/2012  . Encounter for Medicare annual wellness exam 01/27/2016  . GERD (gastroesophageal reflux disease) 01/27/2016  . Hepatitis C, chronic ([place])   . History of intravenous drug use in remission   . History of oral lesions 05/14/2013  . History of oral lesions 07/29/2015  . Left shoulder pain 12/16/2012  . 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 Myalgia and myositis 08/13/2014  . Preventative health care 05/14/2013    Past Surgical History:  Procedure Laterality Date  . KNEE ARTHROSCOPY  1996   ligment repair    Family History  Problem Relation Age of Onset  . Heart disease Father   . Diabetes Father   . Hyperlipidemia Father   . Hypertension Father   . Thyroid disease Sister   . Heart disease Cousin     MI  . Diabetes Cousin   . Obesity Cousin   . Osteoporosis Mother   . Diabetes Paternal Grandfather   . Thyroid disease Maternal Grandmother    . Cancer Neg Hx     negative for colon and prostate    Social History   Social History  . Marital status: Married    Spouse name: N/A  . Number of children: N/A  . Years of education: N/A   Occupational History  . Not on file.   Social History Main Topics  . Smoking status: Never Smoker  . Smokeless tobacco: Never Used  . Alcohol use No  . Drug use: No  . Sexual activity: Yes     Comment: avoids nuts, fruits, lives with wife, makes picture frames, building maintenance   Other Topics Concern  . Not on file   Social History Narrative  . No narrative on file    Outpatient Medications Prior to Visit  Medication Sig Dispense Refill  . lisinopril (PRINIVIL,ZESTRIL) 2.5 MG tablet Take 1 tablet (2.5 mg total) by mouth daily. 30 tablet 5  . methadone (DOLOPHINE) 10 MG/5ML solution Take 8 mg by mouth every morning.     . Multiple Vitamin (MULTIVITAMIN) tablet Take 1 tablet by mouth every morning.     . naproxen (NAPROSYN) 500 MG tablet Take 1 tablet (500 mg total) by mouth 2 (two) times daily with a meal. 60 tablet 1  . fluocinonide gel (LIDEX) [id number] % Apply 1 application topically 2 (two) times daily as needed. Applies to tongue as needed 60 g 1  .  magic mouthwash SOLN Take 5 mLs by mouth 2 (two) times daily. 240 mL 0  . cyclobenzaprine (FLEXERIL) 10 MG tablet Take ONE (1) tablet by mouth twice daily as needed for MUSCLE SPASMS (Patient not taking: Reported on 01/27/2016) 30 tablet 1   No facility-administered medications prior to visit.     No Known Allergies  Review of Systems  Constitutional: Negative for chills, fever and malaise/fatigue.  HENT: Positive for congestion. Negative for hearing loss.   Eyes: Negative for discharge.  Respiratory: Negative for cough, sputum production and shortness of breath.   Cardiovascular: Negative for chest pain, palpitations and leg swelling.  Gastrointestinal: Positive for heartburn. Negative for abdominal pain, blood in stool,  constipation, diarrhea, nausea and vomiting.  Genitourinary: Negative for dysuria, frequency, hematuria and urgency.  Musculoskeletal: Positive for back pain. Negative for falls and myalgias.  Skin: Negative for rash.  Neurological: Negative for dizziness, sensory change, loss of consciousness, weakness and headaches.  Endo/Heme/Allergies: Negative for environmental allergies. Does not bruise/bleed easily.  Psychiatric/Behavioral: Negative for depression and suicidal ideas. The patient is not nervous/anxious and does not have insomnia.        Objective:    Physical Exam  Constitutional: He is oriented to person, place, and time. He appears well-developed and well-nourished. No distress.  HENT:  Head: Normocephalic and atraumatic.  Eyes: Conjunctivae are normal.  Neck: Neck supple. No thyromegaly present.  Cardiovascular: Normal rate, regular rhythm and normal heart sounds.   No murmur heard. Pulmonary/Chest: Effort normal and breath sounds normal. No respiratory distress. He has no wheezes.  Abdominal: Soft. Bowel sounds are normal. He exhibits no mass. There is no tenderness.  Musculoskeletal: He exhibits no edema.  Lymphadenopathy:    He has no cervical adenopathy.  Neurological: He is alert and oriented to person, place, and time.  Skin: Skin is warm and dry.  Psychiatric: He has a normal mood and affect. His behavior is normal.    BP (!) 142/82 (BP Location: Right Arm, Patient Position: Sitting, Cuff Size: Normal)   Pulse 76   Temp 98.5 F (36.9 C) (Oral)   Ht 6' (1.829 m)   Wt 168 lb 2 oz (76.3 kg)   SpO2 96%   BMI 22.80 kg/m  Wt Readings from Last 3 Encounters:  01/27/16 168 lb 2 oz (76.3 kg)  07/29/15 172 lb (78 kg)  03/08/15 166 lb 8 oz (75.5 kg)     Lab Results  Component Value Date   WBC 3.7 (L) 07/29/2015   HGB 15.3 07/29/2015   HCT 45.8 07/29/2015   PLT 136.0 (L) 07/29/2015   GLUCOSE 93 07/29/2015   CHOL 110 01/13/2014   TRIG 53 01/13/2014   HDL 53  01/13/2014   LDLCALC 46 01/13/2014   ALT 56 (H) 07/29/2015   AST 46 (H) 07/29/2015   NA 140 07/29/2015   K 4.6 07/29/2015   CL 101 07/29/2015   CREATININE 1.18 07/29/2015   BUN 16 07/29/2015   CO2 32 07/29/2015   TSH 0.98 07/29/2015   PSA 0.76 07/21/2014   INR 1.05 07/04/2012   HGBA1C 5.7 07/29/2015    Lab Results  Component Value Date   TSH 0.98 07/29/2015   Lab Results  Component Value Date   WBC 3.7 (L) 07/29/2015   HGB 15.3 07/29/2015   HCT 45.8 07/29/2015   MCV 91.0 07/29/2015   PLT 136.0 (L) 07/29/2015   Lab Results  Component Value Date   NA 140 07/29/2015   K 4.6  07/29/2015   CO2 32 07/29/2015   GLUCOSE 93 07/29/2015   BUN 16 07/29/2015   CREATININE 1.18 07/29/2015   BILITOT 0.6 07/29/2015   ALKPHOS 60 07/29/2015   AST 46 (H) 07/29/2015   ALT 56 (H) 07/29/2015   PROT 7.4 07/29/2015   ALBUMIN 4.6 07/29/2015   CALCIUM 9.8 07/29/2015   GFR 65.66 07/29/2015   Lab Results  Component Value Date   CHOL 110 01/13/2014   Lab Results  Component Value Date   HDL 53 01/13/2014   Lab Results  Component Value Date   LDLCALC 46 01/13/2014   Lab Results  Component Value Date   TRIG 53 01/13/2014   Lab Results  Component Value Date   CHOLHDL 2.1 01/13/2014   Lab Results  Component Value Date   HGBA1C 5.7 07/29/2015       Assessment & Plan:   Problem List Items Addressed This Visit    Chronic hepatitis C virus infection ([place]) - Primary    Is ready to go back and see Dr Fredrich Birks whom he saw roughly 6 years ago when he was told his disease was still stage one. Referral placed      Relevant Medications   magic mouthwash SOLN   Other Relevant Orders   Ambulatory referral to Gastroenterology   Low back pain    Recent heavy lifting has flared this. Encouraged moist heat and to stay active. Try lidocaine patches and obtain xrays due to recurrent nature. Let us know if it worsens so we can place a referral      Relevant Orders   DG Lumbar Spine  Complete   Hyperglycemia   Relevant Orders   Hemoglobin A1c   Elevated BP    Well controlled. Encouraged heart healthy diet such as the DASH diet and exercise as tolerated.       Relevant Orders   TSH   CBC   Comprehensive metabolic panel   Lipid panel   History of oral lesions   Relevant Medications   fluocinonide gel (LIDEX) 0.05 %   Myalgia and myositis   Thrombocytopenia ([place])   Encounter for Medicare annual wellness exam    Patient denies any difficulties at home. No trouble with ADLs, depression or falls. See EMR for functional status screen and depression screen. No recent changes to vision or hearing. Is UTD with immunizations. Is UTD with screening. Discussed Advanced Directives. Encouraged heart healthy diet, exercise as tolerated and adequate sleep. See patient's problem list for health risk factors to monitor. See AVS for preventative healthcare recommendation schedule.Agrees to bring up AD paperwork, labs ordered today      GERD (gastroesophageal reflux disease)    Avoid offending foods, start probiotics. Do not eat large meals in late evening and consider raising head of bed. Given rx for Rantidine to use qhs for next month then prn to see if that helps with his sense of mucus and globus if it does not help may want to empirically try Antihistamines and let us know if symptoms worsen      Relevant Medications   magic mouthwash SOLN   ranitidine (ZANTAC) 300 MG tablet    Other Visit Diagnoses    Sun-damaged skin       Relevant Orders   Ambulatory referral to Dermatology      I am having Mr. Beltrame start on ranitidine. I am also having him maintain his methadone, multivitamin, naproxen, lisinopril, cyclobenzaprine, magic mouthwash, and fluocinonide gel.  Meds ordered this encounter  Medications  . magic mouthwash SOLN    Sig: Take 5 mLs by mouth 2 (two) times daily.    Dispense:  240 mL    Refill:  1  . fluocinonide gel (LIDEX) 0.05 %    Sig: Apply 1  application topically 2 (two) times daily as needed. Applies to tongue as needed    Dispense:  60 g    Refill:  1  . ranitidine (ZANTAC) 300 MG tablet    Sig: Take 0.5-1 tablets (150-300 mg total) by mouth at bedtime as needed for heartburn.    Dispense:  300 tablet    Refill:  3     Penni Homans, MD

## 2016-01-27 NOTE — Assessment & Plan Note (Signed)
Recent heavy lifting has flared this. Encouraged moist heat and to stay active. Try lidocaine patches and obtain xrays due to recurrent nature. Let us know if it worsens so we can place a referral

## 2016-01-27 NOTE — Assessment & Plan Note (Signed)
Avoid offending foods, start probiotics. Do not eat large meals in late evening and consider raising head of bed. Given rx for Rantidine to use qhs for next month then prn to see if that helps with his sense of mucus and globus if it does not help may want to empirically try Antihistamines and let us know if symptoms worsen

## 2016-01-27 NOTE — Progress Notes (Signed)
Pre visit review using our clinic review tool, if applicable. No additional management support is needed unless otherwise documented below in the visit note. 

## 2016-01-27 NOTE — Patient Instructions (Addendum)
Encouraged moist heat and gentle stretching as tolerated. May try NSAIDs and prescription meds as directed and report if symptoms worsen or seek immediate care  Try the salon pas patches regular or Lidocaine patches. Aspercreme Lidocaine patches  Bring in Copy of [place] and Living Will and get copies to your HCP  Preventive Care for Adults, Male A healthy lifestyle and preventive care can promote health and wellness. Preventive health guidelines for men include the following key practices:  A routine yearly physical is a good way to check with your health care provider about your health and preventative screening. It is a chance to share any concerns and updates on your health and to receive a thorough exam.  Visit your dentist for a routine exam and preventative care every 6 months. Brush your teeth twice a day and floss once a day. Good oral hygiene prevents tooth decay and gum disease.  The frequency of eye exams is based on your age, health, family medical history, use of contact lenses, and other factors. Follow your health care provider's recommendations for frequency of eye exams.  Eat a healthy diet. Foods such as vegetables, fruits, whole grains, low-fat dairy products, and lean protein foods contain the nutrients you need without too many calories. Decrease your intake of foods high in solid fats, added sugars, and salt. Eat the right amount of calories for you.Get information about a proper diet from your health care provider, if necessary.  Regular physical exercise is one of the most important things you can do for your health. Most adults should get at least 150 minutes of moderate-intensity exercise (any activity that increases your heart rate and causes you to sweat) each week. In addition, most adults need muscle-strengthening exercises on 2 or more days a week.  Maintain a healthy weight. The body mass index (BMI) is a screening tool to identify possible  weight problems. It provides an estimate of body fat based on height and weight. Your health care provider can find your BMI and can help you achieve or maintain a healthy weight.For adults 20 years and older:  A BMI below 18.5 is considered underweight.  A BMI of 18.5 to 24.9 is normal.  A BMI of 25 to 29.9 is considered overweight.  A BMI of 30 and above is considered obese.  Maintain normal blood lipids and cholesterol levels by exercising and minimizing your intake of saturated fat. Eat a balanced diet with plenty of fruit and vegetables. Blood tests for lipids and cholesterol should begin at age 16 and be repeated every 5 years. If your lipid or cholesterol levels are high, you are over 50, or you are at high risk for heart disease, you may need your cholesterol levels checked more frequently.Ongoing high lipid and cholesterol levels should be treated with medicines if diet and exercise are not working.  If you smoke, find out from your health care provider how to quit. If you do not use tobacco, do not start.  Lung cancer screening is recommended for adults aged 23-80 years who are at high risk for developing lung cancer because of a history of smoking. A yearly low-dose CT scan of the lungs is recommended for people who have at least a 30-pack-year history of smoking and are a current smoker or have quit within the past 15 years. A pack year of smoking is smoking an average of 1 pack of cigarettes a day for 1 year (for example: 1 pack a day for  30 years or 2 packs a day for 15 years). Yearly screening should continue until the smoker has stopped smoking for at least 15 years. Yearly screening should be stopped for people who develop a health problem that would prevent them from having lung cancer treatment.  If you choose to drink alcohol, do not have more than 2 drinks per day. One drink is considered to be 12 ounces (355 mL) of beer, 5 ounces (148 mL) of wine, or 1.5 ounces (44 mL) of  liquor.  Avoid use of street drugs. Do not share needles with anyone. Ask for help if you need support or instructions about stopping the use of drugs.  High blood pressure causes heart disease and increases the risk of stroke. Your blood pressure should be checked at least every 1-2 years. Ongoing high blood pressure should be treated with medicines, if weight loss and exercise are not effective.  If you are 80-97 years old, ask your health care provider if you should take aspirin to prevent heart disease.  Diabetes screening is done by taking a blood sample to check your blood glucose level after you have not eaten for a certain period of time (fasting). If you are not overweight and you do not have risk factors for diabetes, you should be screened once every 3 years starting at age 6. If you are overweight or obese and you are 32-14 years of age, you should be screened for diabetes every year as part of your cardiovascular risk assessment.  Colorectal cancer can be detected and often prevented. Most routine colorectal cancer screening begins at the age of 15 and continues through age 53. However, your health care provider may recommend screening at an earlier age if you have risk factors for colon cancer. On a yearly basis, your health care provider may provide home test kits to check for hidden blood in the stool. Use of a small camera at the end of a tube to directly examine the colon (sigmoidoscopy or colonoscopy) can detect the earliest forms of colorectal cancer. Talk to your health care provider about this at age 29, when routine screening begins. Direct exam of the colon should be repeated every 5-10 years through age 51, unless early forms of precancerous polyps or small growths are found.  People who are at an increased risk for hepatitis B should be screened for this virus. You are considered at high risk for hepatitis B if:  You were born in a country where hepatitis B occurs often. Talk  with your health care provider about which countries are considered high risk.  Your parents were born in a high-risk country and you have not received a shot to protect against hepatitis B (hepatitis B vaccine).  You have HIV or AIDS.  You use needles to inject street drugs.  You live with, or have sex with, someone who has hepatitis B.  You are a man who has sex with other men (MSM).  You get hemodialysis treatment.  You take certain medicines for conditions such as cancer, organ transplantation, and autoimmune conditions.  Hepatitis C blood testing is recommended for all people born from [birth year] through 1965 and any individual with known risks for hepatitis C.  Practice safe sex. Use condoms and avoid high-risk sexual practices to reduce the spread of sexually transmitted infections (STIs). STIs include gonorrhea, chlamydia, syphilis, trichomonas, herpes, HPV, and human immunodeficiency virus (HIV). Herpes, HIV, and HPV are viral illnesses that have no cure. They can result in  disability, cancer, and death.  If you are a man who has sex with other men, you should be screened at least once per year for:  HIV.  Urethral, rectal, and pharyngeal infection of gonorrhea, chlamydia, or both.  If you are at risk of being infected with HIV, it is recommended that you take a prescription medicine daily to prevent HIV infection. This is called preexposure prophylaxis (PrEP). You are considered at risk if:  You are a man who has sex with other men (MSM) and have other risk factors.  You are a heterosexual man, are sexually active, and are at increased risk for HIV infection.  You take drugs by injection.  You are sexually active with a partner who has HIV.  Talk with your health care provider about whether you are at high risk of being infected with HIV. If you choose to begin PrEP, you should first be tested for HIV. You should then be tested every 3 months for as long as you are taking  PrEP.  A one-time screening for abdominal aortic aneurysm (AAA) and surgical repair of large AAAs by ultrasound are recommended for men ages 51 to 37 years who are current or former smokers.  Healthy men should no longer receive prostate-specific antigen (PSA) blood tests as part of routine cancer screening. Talk with your health care provider about prostate cancer screening.  Testicular cancer screening is not recommended for adult males who have no symptoms. Screening includes self-exam, a health care provider exam, and other screening tests. Consult with your health care provider about any symptoms you have or any concerns you have about testicular cancer.  Use sunscreen. Apply sunscreen liberally and repeatedly throughout the day. You should seek shade when your shadow is shorter than you. Protect yourself by wearing long sleeves, pants, a wide-brimmed hat, and sunglasses year round, whenever you are outdoors.  Once a month, do a whole-body skin exam, using a mirror to look at the skin on your back. Tell your health care provider about new moles, moles that have irregular borders, moles that are larger than a pencil eraser, or moles that have changed in shape or color.  Stay current with required vaccines (immunizations).  Influenza vaccine. All adults should be immunized every year.  Tetanus, diphtheria, and acellular pertussis (Td, Tdap) vaccine. An adult who has not previously received Tdap or who does not know his vaccine status should receive 1 dose of Tdap. This initial dose should be followed by tetanus and diphtheria toxoids (Td) booster doses every 10 years. Adults with an unknown or incomplete history of completing a 3-dose immunization series with Td-containing vaccines should begin or complete a primary immunization series including a Tdap dose. Adults should receive a Td booster every 10 years.  Varicella vaccine. An adult without evidence of immunity to varicella should receive 2  doses or a second dose if he has previously received 1 dose.  Human papillomavirus (HPV) vaccine. Males aged 11-21 years who have not received the vaccine previously should receive the 3-dose series. Males aged 22-26 years may be immunized. Immunization is recommended through the age of 58 years for any male who has sex with males and did not get any or all doses earlier. Immunization is recommended for any person with an immunocompromised condition through the age of 36 years if he did not get any or all doses earlier. During the 3-dose series, the second dose should be obtained 4-8 weeks after the first dose. The third dose should  be obtained 24 weeks after the first dose and 16 weeks after the second dose.  Zoster vaccine. One dose is recommended for adults aged 47 years or older unless certain conditions are present.  Measles, mumps, and rubella (MMR) vaccine. Adults born before 45 generally are considered immune to measles and mumps. Adults born in [birth year] or later should have 1 or more doses of MMR vaccine unless there is a contraindication to the vaccine or there is laboratory evidence of immunity to each of the three diseases. A routine second dose of MMR vaccine should be obtained at least 28 days after the first dose for students attending postsecondary schools, health care workers, or international travelers. People who received inactivated measles vaccine or an unknown type of measles vaccine during 1963-1967 should receive 2 doses of MMR vaccine. People who received inactivated mumps vaccine or an unknown type of mumps vaccine before 1979 and are at high risk for mumps infection should consider immunization with 2 doses of MMR vaccine. Unvaccinated health care workers born before [birth year] who lack laboratory evidence of measles, mumps, or rubella immunity or laboratory confirmation of disease should consider measles and mumps immunization with 2 doses of MMR vaccine or rubella immunization with 1 dose  of MMR vaccine.  Pneumococcal 13-valent conjugate (PCV13) vaccine. When indicated, a person who is uncertain of his immunization history and has no record of immunization should receive the PCV13 vaccine. All adults 76 years of age and older should receive this vaccine. An adult aged 61 years or older who has certain medical conditions and has not been previously immunized should receive 1 dose of PCV13 vaccine. This PCV13 should be followed with a dose of pneumococcal polysaccharide (PPSV23) vaccine. Adults who are at high risk for pneumococcal disease should obtain the PPSV23 vaccine at least 8 weeks after the dose of PCV13 vaccine. Adults older than 66 years of age who have normal immune system function should obtain the PPSV23 vaccine dose at least 1 year after the dose of PCV13 vaccine.  Pneumococcal polysaccharide (PPSV23) vaccine. When PCV13 is also indicated, PCV13 should be obtained first. All adults aged 16 years and older should be immunized. An adult younger than age 87 years who has certain medical conditions should be immunized. Any person who resides in a nursing home or long-term care facility should be immunized. An adult smoker should be immunized. People with an immunocompromised condition and certain other conditions should receive both PCV13 and PPSV23 vaccines. People with human immunodeficiency virus (HIV) infection should be immunized as soon as possible after diagnosis. Immunization during chemotherapy or radiation therapy should be avoided. Routine use of PPSV23 vaccine is not recommended for American Indians, [place] Natives, or people younger than 65 years unless there are medical conditions that require PPSV23 vaccine. When indicated, people who have unknown immunization and have no record of immunization should receive PPSV23 vaccine. One-time revaccination 5 years after the first dose of PPSV23 is recommended for people aged 19-64 years who have chronic kidney failure, nephrotic  syndrome, asplenia, or immunocompromised conditions. People who received 1-2 doses of PPSV23 before age 46 years should receive another dose of PPSV23 vaccine at age 68 years or later if at least 5 years have passed since the previous dose. Doses of PPSV23 are not needed for people immunized with PPSV23 at or after age 45 years.  Meningococcal vaccine. Adults with asplenia or persistent complement component deficiencies should receive 2 doses of quadrivalent meningococcal conjugate (MenACWY-D) vaccine. The doses should be  obtained at least 2 months apart. Microbiologists working with certain meningococcal bacteria, [place] recruits, people at risk during an outbreak, and people who travel to or live in countries with a high rate of meningitis should be immunized. A first-year college student up through age 72 years who is living in a residence hall should receive a dose if he did not receive a dose on or after his 16th birthday. Adults who have certain high-risk conditions should receive one or more doses of vaccine.  Hepatitis A vaccine. Adults who wish to be protected from this disease, have chronic liver disease, work with hepatitis A-infected animals, work in hepatitis A research labs, or travel to or work in countries with a high rate of hepatitis A should be immunized. Adults who were previously unvaccinated and who anticipate close contact with an international adoptee during the first 60 days after arrival in the [place] from a country with a high rate of hepatitis A should be immunized.  Hepatitis B vaccine. Adults should be immunized if they wish to be protected from this disease, are under age 85 years and have diabetes, have chronic liver disease, have had more than one sex partner in the past 6 months, may be exposed to blood or other infectious body fluids, are household contacts or sex partners of hepatitis B positive people, are clients or workers in certain care facilities, or travel to  or work in countries with a high rate of hepatitis B.  Haemophilus influenzae type b (Hib) vaccine. A previously unvaccinated person with asplenia or sickle cell disease or having a scheduled splenectomy should receive 1 dose of Hib vaccine. Regardless of previous immunization, a recipient of a hematopoietic stem cell transplant should receive a 3-dose series 6-12 months after his successful transplant. Hib vaccine is not recommended for adults with HIV infection. Preventive Service / Frequency Ages 35 to 69  Blood pressure check.** / Every 3-5 years.  Lipid and cholesterol check.** / Every 5 years beginning at age 66.  Hepatitis C blood test.** / For any individual with known risks for hepatitis C.  Skin self-exam. / Monthly.  Influenza vaccine. / Every year.  Tetanus, diphtheria, and acellular pertussis (Tdap, Td) vaccine.** / Consult your health care provider. 1 dose of Td every 10 years.  Varicella vaccine.** / Consult your health care provider.  HPV vaccine. / 3 doses over 6 months, if 36 or younger.  Measles, mumps, rubella (MMR) vaccine.** / You need at least 1 dose of MMR if you were born in 1957 or later. You may also need a second dose.  Pneumococcal 13-valent conjugate (PCV13) vaccine.** / Consult your health care provider.  Pneumococcal polysaccharide (PPSV23) vaccine.** / 1 to 2 doses if you smoke cigarettes or if you have certain conditions.  Meningococcal vaccine.** / 1 dose if you are age 96 to 85 years and a Market researcher living in a residence hall, or have one of several medical conditions. You may also need additional booster doses.  Hepatitis A vaccine.** / Consult your health care provider.  Hepatitis B vaccine.** / Consult your health care provider.  Haemophilus influenzae type b (Hib) vaccine.** / Consult your health care provider. Ages 37 to 107  Blood pressure check.** / Every year.  Lipid and cholesterol check.** / Every 5 years beginning  at age 45.  Lung cancer screening. / Every year if you are aged 75-80 years and have a 30-pack-year history of smoking and currently smoke or have quit within the  past 15 years. Yearly screening is stopped once you have quit smoking for at least 15 years or develop a health problem that would prevent you from having lung cancer treatment.  Fecal occult blood test (FOBT) of stool. / Every year beginning at age 98 and continuing until age 87. You may not have to do this test if you get a colonoscopy every 10 years.  Flexible sigmoidoscopy** or colonoscopy.** / Every 5 years for a flexible sigmoidoscopy or every 10 years for a colonoscopy beginning at age 57 and continuing until age 45.  Hepatitis C blood test.** / For all people born from [birth year] through 1965 and any individual with known risks for hepatitis C.  Skin self-exam. / Monthly.  Influenza vaccine. / Every year.  Tetanus, diphtheria, and acellular pertussis (Tdap/Td) vaccine.** / Consult your health care provider. 1 dose of Td every 10 years.  Varicella vaccine.** / Consult your health care provider.  Zoster vaccine.** / 1 dose for adults aged 72 years or older.  Measles, mumps, rubella (MMR) vaccine.** / You need at least 1 dose of MMR if you were born in 1957 or later. You may also need a second dose.  Pneumococcal 13-valent conjugate (PCV13) vaccine.** / Consult your health care provider.  Pneumococcal polysaccharide (PPSV23) vaccine.** / 1 to 2 doses if you smoke cigarettes or if you have certain conditions.  Meningococcal vaccine.** / Consult your health care provider.  Hepatitis A vaccine.** / Consult your health care provider.  Hepatitis B vaccine.** / Consult your health care provider.  Haemophilus influenzae type b (Hib) vaccine.** / Consult your health care provider. Ages 38 and over  Blood pressure check.** / Every year.  Lipid and cholesterol check.**/ Every 5 years beginning at age 63.  Lung cancer screening.  / Every year if you are aged 81-80 years and have a 30-pack-year history of smoking and currently smoke or have quit within the past 15 years. Yearly screening is stopped once you have quit smoking for at least 15 years or develop a health problem that would prevent you from having lung cancer treatment.  Fecal occult blood test (FOBT) of stool. / Every year beginning at age 62 and continuing until age 44. You may not have to do this test if you get a colonoscopy every 10 years.  Flexible sigmoidoscopy** or colonoscopy.** / Every 5 years for a flexible sigmoidoscopy or every 10 years for a colonoscopy beginning at age 40 and continuing until age 44.  Hepatitis C blood test.** / For all people born from [birth year] through 1966 and any individual with known risks for hepatitis C.  Abdominal aortic aneurysm (AAA) screening.** / A one-time screening for ages 17 to 38 years who are current or former smokers.  Skin self-exam. / Monthly.  Influenza vaccine. / Every year.  Tetanus, diphtheria, and acellular pertussis (Tdap/Td) vaccine.** / 1 dose of Td every 10 years.  Varicella vaccine.** / Consult your health care provider.  Zoster vaccine.** / 1 dose for adults aged 68 years or older.  Pneumococcal 13-valent conjugate (PCV13) vaccine.** / 1 dose for all adults aged 39 years and older.  Pneumococcal polysaccharide (PPSV23) vaccine.** / 1 dose for all adults aged 30 years and older.  Meningococcal vaccine.** / Consult your health care provider.  Hepatitis A vaccine.** / Consult your health care provider.  Hepatitis B vaccine.** / Consult your health care provider.  Haemophilus influenzae type b (Hib) vaccine.** / Consult your health care provider. **Family history and personal history of  risk and conditions may change your health care provider's recommendations.   This information is not intended to replace advice given to you by your health care provider. Make sure you discuss any questions you  have with your health care provider.   Document Released: 07/25/2001 Document Revised: 06/19/2014 Document Reviewed: 10/24/2010 Elsevier Interactive Patient Education [employer].

## 2016-01-27 NOTE — Assessment & Plan Note (Signed)
Well controlled. Encouraged heart healthy diet such as the DASH diet and exercise as tolerated.  

## 2016-01-28 ENCOUNTER — Other Ambulatory Visit: Payer: Self-pay | Admitting: Family Medicine

## 2016-01-28 DIAGNOSIS — E875 Hyperkalemia: Secondary | ICD-10-CM

## 2016-01-31 ENCOUNTER — Encounter: Payer: Self-pay | Admitting: Family Medicine

## 2016-02-07 ENCOUNTER — Other Ambulatory Visit (INDEPENDENT_AMBULATORY_CARE_PROVIDER_SITE_OTHER): Payer: Medicare Other

## 2016-02-07 DIAGNOSIS — E875 Hyperkalemia: Secondary | ICD-10-CM

## 2016-02-07 LAB — COMPREHENSIVE METABOLIC PANEL
ALBUMIN: 4.7 g/dL (ref 3.5–5.2)
ALT: 55 U/L — ABNORMAL HIGH (ref 0–53)
AST: 48 U/L — ABNORMAL HIGH (ref 0–37)
Alkaline Phosphatase: 55 U/L (ref 39–117)
BUN: 16 mg/dL (ref 6–23)
CHLORIDE: 101 meq/L (ref 96–112)
CO2: 29 mEq/L (ref 19–32)
Calcium: 9.5 mg/dL (ref 8.4–10.5)
Creatinine, Ser: 1.29 mg/dL (ref 0.40–1.50)
GFR: 59.15 mL/min — AB (ref >60.00)
Glucose, Bld: 133 mg/dL — ABNORMAL HIGH (ref 70–99)
POTASSIUM: 4.2 meq/L (ref 3.5–5.1)
SODIUM: 140 meq/L (ref 135–145)
Total Bilirubin: 0.9 mg/dL (ref 0.2–1.2)
Total Protein: 7.4 g/dL (ref 6.0–8.3)

## 2016-03-02 DIAGNOSIS — L821 Other seborrheic keratosis: Secondary | ICD-10-CM | POA: Diagnosis not present

## 2016-03-02 DIAGNOSIS — D1801 Hemangioma of skin and subcutaneous tissue: Secondary | ICD-10-CM | POA: Diagnosis not present

## 2016-03-07 ENCOUNTER — Other Ambulatory Visit (HOSPITAL_COMMUNITY): Payer: Self-pay | Admitting: Nurse Practitioner

## 2016-03-07 DIAGNOSIS — B182 Chronic viral hepatitis C: Secondary | ICD-10-CM | POA: Diagnosis not present

## 2016-04-10 ENCOUNTER — Other Ambulatory Visit: Payer: Self-pay | Admitting: Family Medicine

## 2016-04-25 ENCOUNTER — Ambulatory Visit (HOSPITAL_COMMUNITY)
Admission: RE | Admit: 2016-04-25 | Discharge: 2016-04-25 | Disposition: A | Discharge status: Home / Self Care | Payer: Medicare Other | Source: Ambulatory Visit | Attending: Nurse Practitioner | Admitting: Nurse Practitioner

## 2016-04-25 DIAGNOSIS — B182 Chronic viral hepatitis C: Secondary | ICD-10-CM | POA: Diagnosis not present

## 2016-04-25 DIAGNOSIS — D1803 Hemangioma of intra-abdominal structures: Secondary | ICD-10-CM | POA: Insufficient documentation

## 2016-04-28 ENCOUNTER — Telehealth: Payer: Self-pay | Admitting: Family Medicine

## 2016-04-28 NOTE — Telephone Encounter (Signed)
OK to start Hep B series

## 2016-04-28 NOTE — Telephone Encounter (Signed)
Caller name: Relationship to patient: Self Can be reached: [phone] Pharmacy:  Reason for call: Patient wants to know if he can come to the office and get a Hep B Vac. States he is getting ready to start the Hep C treatment and wants the Hep B Vac prior to starting.

## 2016-05-01 ENCOUNTER — Ambulatory Visit (INDEPENDENT_AMBULATORY_CARE_PROVIDER_SITE_OTHER): Payer: Medicare Other | Admitting: *Deleted

## 2016-05-01 DIAGNOSIS — Z23 Encounter for immunization: Secondary | ICD-10-CM | POA: Diagnosis not present

## 2016-05-01 NOTE — Addendum Note (Signed)
Addended by: Dorrene German on: 05/01/2016 10:31 AM   Modules accepted: Orders

## 2016-05-01 NOTE — Telephone Encounter (Signed)
Pt came in for flu shot today and also received first Hep B vaccine. Next appointment scheduled for 06/01/16 for second Hep B.

## 2016-05-01 NOTE — Telephone Encounter (Signed)
Please schedule 15 min nurse visit for first Hepatitis B vaccine.

## 2016-05-01 NOTE — Progress Notes (Signed)
Pre visit review using our clinic review tool, if applicable. No additional management support is needed unless otherwise documented below in the visit note.  Pt here for flu shot. Also requested Hep B #1 per phone note. Patient tolerated injection well.  Next appointment: 06/01/16  Dorrene German, RN

## 2016-05-30 DIAGNOSIS — K740 Hepatic fibrosis: Secondary | ICD-10-CM | POA: Diagnosis not present

## 2016-05-30 DIAGNOSIS — B182 Chronic viral hepatitis C: Secondary | ICD-10-CM | POA: Diagnosis not present

## 2016-06-01 ENCOUNTER — Ambulatory Visit (INDEPENDENT_AMBULATORY_CARE_PROVIDER_SITE_OTHER): Payer: Medicare Other

## 2016-06-01 DIAGNOSIS — Z23 Encounter for immunization: Secondary | ICD-10-CM | POA: Diagnosis not present

## 2016-06-01 NOTE — Progress Notes (Signed)
Pre visit review using our clinic tool,if applicable. No additional management support is needed unless otherwise documented below in the visit note.   Patient in for 2nd Hepatitis B injection per order from Dr. Charlett Blake. Given IM left deltoid. Patient tolerated well.

## 2016-07-19 DIAGNOSIS — B182 Chronic viral hepatitis C: Secondary | ICD-10-CM | POA: Diagnosis not present

## 2016-07-27 ENCOUNTER — Other Ambulatory Visit: Payer: Self-pay | Admitting: Family Medicine

## 2016-07-27 DIAGNOSIS — Z8719 Personal history of other diseases of the digestive system: Secondary | ICD-10-CM

## 2016-07-27 MED ORDER — FLUOCINONIDE 0.05 % EX GEL: 1.0000 "application " | Freq: Two times a day (BID) | CUTANEOUS | 1 refills | Status: DC | PRN

## 2016-07-27 MED ORDER — CYCLOBENZAPRINE HCL 10 MG PO TABS: ORAL_TABLET | ORAL | 0 refills | Status: DC

## 2016-07-27 NOTE — Telephone Encounter (Signed)
Patient is requesting a refill of fluocinonide gel (LIDEX) 0.05 % and cyclobenzaprine (FLEXERIL) 10 MG tablet  Please advise  Pharmacy: [facility] [phone] [address], [place], [place] [address]

## 2016-08-16 DIAGNOSIS — B182 Chronic viral hepatitis C: Secondary | ICD-10-CM | POA: Diagnosis not present

## 2016-08-23 DIAGNOSIS — B182 Chronic viral hepatitis C: Secondary | ICD-10-CM | POA: Diagnosis not present

## 2016-09-05 ENCOUNTER — Telehealth: Payer: Self-pay | Admitting: Family Medicine

## 2016-09-05 MED ORDER — MAGIC MOUTHWASH: 5.0000 mL | Freq: Two times a day (BID) | ORAL | 1 refills | Status: DC

## 2016-09-05 NOTE — Telephone Encounter (Signed)
Will fax to

## 2016-09-05 NOTE — Telephone Encounter (Signed)
Caller name: Relationship to patient: Self Can be reached: [phone]  Pharmacy:  [facility], [place] - [address] [phone] (Phone) [phone] (Fax)     Reason for call: Refill magic mouthwash SOLN

## 2016-09-05 NOTE — Telephone Encounter (Signed)
Faxed to pharmacy

## 2016-10-31 ENCOUNTER — Ambulatory Visit (INDEPENDENT_AMBULATORY_CARE_PROVIDER_SITE_OTHER): Payer: Medicare Other

## 2016-10-31 DIAGNOSIS — Z23 Encounter for immunization: Secondary | ICD-10-CM | POA: Diagnosis not present

## 2016-10-31 NOTE — Progress Notes (Signed)
Patient in for 3rd Hepatitis B immunization. Given 20 mcg IM left deltoid.  Patient tolerated well. No complaints voiced.

## 2016-11-09 DIAGNOSIS — B182 Chronic viral hepatitis C: Secondary | ICD-10-CM | POA: Diagnosis not present

## 2016-11-10 ENCOUNTER — Other Ambulatory Visit: Payer: Self-pay | Admitting: Family Medicine

## 2016-11-10 MED ORDER — CYCLOBENZAPRINE HCL 10 MG PO TABS: ORAL_TABLET | ORAL | 1 refills | Status: DC

## 2016-11-15 DIAGNOSIS — B182 Chronic viral hepatitis C: Secondary | ICD-10-CM | POA: Diagnosis not present

## 2016-11-15 DIAGNOSIS — K740 Hepatic fibrosis: Secondary | ICD-10-CM | POA: Diagnosis not present

## 2017-01-30 ENCOUNTER — Ambulatory Visit (INDEPENDENT_AMBULATORY_CARE_PROVIDER_SITE_OTHER): Payer: Medicare Other | Admitting: Family Medicine

## 2017-01-30 ENCOUNTER — Encounter: Payer: Self-pay | Admitting: Family Medicine

## 2017-01-30 VITALS — BP 140/70 | HR 73 | Temp 98.2°F | Resp 18 | Ht 72.0 in | Wt 170.4 lb

## 2017-01-30 DIAGNOSIS — T7840XA Allergy, unspecified, initial encounter: Secondary | ICD-10-CM

## 2017-01-30 DIAGNOSIS — Z125 Encounter for screening for malignant neoplasm of prostate: Secondary | ICD-10-CM

## 2017-01-30 DIAGNOSIS — Z0000 Encounter for general adult medical examination without abnormal findings: Secondary | ICD-10-CM

## 2017-01-30 DIAGNOSIS — I10 Essential (primary) hypertension: Secondary | ICD-10-CM

## 2017-01-30 DIAGNOSIS — Z23 Encounter for immunization: Secondary | ICD-10-CM | POA: Diagnosis not present

## 2017-01-30 DIAGNOSIS — E785 Hyperlipidemia, unspecified: Secondary | ICD-10-CM

## 2017-01-30 DIAGNOSIS — R739 Hyperglycemia, unspecified: Secondary | ICD-10-CM

## 2017-01-30 HISTORY — DX: Allergy, unspecified, initial encounter: T78.40XA

## 2017-01-30 LAB — LIPID PANEL
CHOL/HDL RATIO: 2
Cholesterol: 116 mg/dL (ref 0–200)
HDL: 48.6 mg/dL (ref >39.00)
LDL CALC: 50 mg/dL (ref 0–99)
NONHDL: 66.97
Triglycerides: 84 mg/dL (ref 0.0–149.0)
VLDL: 16.8 mg/dL (ref 0.0–40.0)

## 2017-01-30 LAB — TSH: TSH: 1.34 u[IU]/mL (ref 0.35–4.50)

## 2017-01-30 LAB — PSA, MEDICARE: PSA: 0.75 ng/ml (ref 0.10–4.00)

## 2017-01-30 LAB — HEMOGLOBIN A1C: Hgb A1c MFr Bld: 5.7 % (ref 4.6–6.5)

## 2017-01-30 MED ORDER — MAGIC MOUTHWASH: 5.0000 mL | Freq: Two times a day (BID) | ORAL | 1 refills | Status: DC

## 2017-01-30 NOTE — Assessment & Plan Note (Signed)
Zyrtec/Cetirizine 10 mg daily as needed

## 2017-01-30 NOTE — Assessment & Plan Note (Signed)
Well controlled, no changes to meds. Encouraged heart healthy diet such as the DASH diet and exercise as tolerated.  °

## 2017-01-30 NOTE — Assessment & Plan Note (Addendum)
Patient encouraged to maintain heart healthy diet, regular exercise, adequate sleep. Consider daily probiotics. Take medications as prescribed. Has advanced directives will bring [place] a copy. Labs ordered and reviewed

## 2017-01-30 NOTE — Progress Notes (Signed)
Subjective:  I acted as a Education administrator for Dr. Charlett Blake. [place], [place]  Patient ID: Shaun [name], male    DOB: 08/16/1950, 66 y.o.   MRN: [id number]  No chief complaint on file.   HPI  Patient is in today for an annual exam. Patient is following up on his HTN, hyperglycemia and othet medical concerns. Patient has no acute concerns. No recent febrile illness or acute hospitalizations. Denies CP/palp/SOB/HA/congestion/fevers/GI or GU c/o. Taking meds as prescribed  Is doing well with ADLs at home. Eats well and stays active.    Patient Care Team: [name], MD as PCP - General (Family Medicine)   Past Medical History:  Diagnosis Date  . Allergic state 01/30/2017  . Arm mass   . Arm skin lesion, left 11/12/2012   And sun damaged skin  . Elevated BP 11/14/2012  . Encounter for Medicare annual wellness exam 01/27/2016  . GERD (gastroesophageal reflux disease) 01/27/2016  . Hepatitis C, chronic ([place])   . History of intravenous drug use in remission   . History of oral lesions 05/14/2013  . History of oral lesions 07/29/2015  . Hypertension 11/14/2012  . Left shoulder pain 12/16/2012  . 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 Myalgia and myositis 08/13/2014  . Preventative health care 05/14/2013  . Preventative health care 05/14/2013    Past Surgical History:  Procedure Laterality Date  . KNEE ARTHROSCOPY  1996   ligment repair    Family History  Problem Relation Age of Onset  . Heart disease Father   . Diabetes Father   . Hyperlipidemia Father   . Hypertension Father   . Thyroid disease Sister   . Heart disease Cousin        MI  . Diabetes Cousin   . Obesity Cousin   . Osteoporosis Mother   . Diabetes Paternal Grandfather   . Thyroid disease Maternal Grandmother   . Cancer Neg Hx        negative for colon and prostate    Social History   Social History  . Marital status: Married    Spouse name: N/A  . Number of children: N/A  . Years of education: N/A   Occupational History    . Not on file.   Social History Main Topics  . Smoking status: Never Smoker  . Smokeless tobacco: Never Used  . Alcohol use No  . Drug use: No  . Sexual activity: Yes     Comment: avoids nuts, fruits, lives with wife, makes picture frames, building maintenance   Other Topics Concern  . Not on file   Social History Narrative  . No narrative on file    Outpatient Medications Prior to Visit  Medication Sig Dispense Refill  . cyclobenzaprine (FLEXERIL) 10 MG tablet Take ONE (1) tablet by mouth twice daily as needed for MUSCLE SPASMS 30 tablet 1  . fluocinonide gel (LIDEX) 2.50 % Apply 1 application topically 2 (two) times daily as needed. Applies to tongue as needed 60 g 1  . hepatitis b vaccine for neonates (ENGERIX-B) 10 MCG/0.5ML injection Inject 0.5 mLs into the muscle once.    Marland Kitchen lisinopril (PRINIVIL,ZESTRIL) 2.5 MG tablet Take 1 tablet (2.5 mg total) by mouth daily. 30 tablet 5  . methadone (DOLOPHINE) 10 MG/5ML solution Take 8 mg by mouth every morning.     . Multiple Vitamin (MULTIVITAMIN) tablet Take 1 tablet by mouth every morning.     . naproxen (NAPROSYN) 500 MG tablet Take 1 tablet (500  mg total) by mouth 2 (two) times daily with a meal. 60 tablet 1  . magic mouthwash SOLN Take 5 mLs by mouth 2 (two) times daily. 240 mL 1  . ranitidine (ZANTAC) 300 MG tablet Take 0.5-1 tablets (150-300 mg total) by mouth at bedtime as needed for heartburn. 300 tablet 3   No facility-administered medications prior to visit.     No Known Allergies  Review of Systems  Constitutional: Negative for fever and malaise/fatigue.  HENT: Negative for congestion.   Eyes: Negative for blurred vision.  Respiratory: Negative for cough and shortness of breath.   Cardiovascular: Negative for chest pain, palpitations and leg swelling.  Gastrointestinal: Negative for vomiting.  Musculoskeletal: Negative for back pain.  Skin: Negative for rash.  Neurological: Negative for loss of consciousness and  headaches.       Objective:    Physical Exam  Constitutional: He is oriented to person, place, and time. He appears well-developed and well-nourished. No distress.  HENT:  Head: Normocephalic and atraumatic.  Eyes: Conjunctivae are normal.  Neck: Normal range of motion. No thyromegaly present.  Cardiovascular: Normal rate and regular rhythm.   Pulmonary/Chest: Effort normal and breath sounds normal. He has no wheezes.  Abdominal: Soft. Bowel sounds are normal. There is no tenderness.  Musculoskeletal: Normal range of motion. He exhibits no edema or deformity.  Neurological: He is alert and oriented to person, place, and time.  Skin: Skin is warm and dry. He is not diaphoretic.  Psychiatric: He has a normal mood and affect.    BP 140/70 (BP Location: Left Arm, Patient Position: Sitting, Cuff Size: Normal)   Pulse 73   Temp 98.2 F (36.8 C) (Oral)   Resp 18   Ht 6' (1.829 m)   Wt 170 lb 6.4 oz (77.3 kg)   SpO2 98%   BMI 23.11 kg/m  Wt Readings from Last 3 Encounters:  01/30/17 170 lb 6.4 oz (77.3 kg)  01/27/16 168 lb 2 oz (76.3 kg)  07/29/15 172 lb (78 kg)   BP Readings from Last 3 Encounters:  01/30/17 140/70  01/27/16 (!) 142/82  07/29/15 (!) 144/76     Immunization History  Administered Date(s) Administered  . Hepatitis B, adult 05/01/2016, 06/01/2016, 10/31/2016  . Influenza Split 05/16/2011, 05/14/2012  . Influenza Whole 03/26/2009  . Influenza, High Dose Seasonal PF 05/01/2016  . Influenza,inj,Quad PF,6+ Mos 05/13/2013, 07/21/2014, 07/29/2015  . Pneumococcal Conjugate-13 07/29/2015  . Pneumococcal Polysaccharide-23 07/20/2008, 01/30/2017  . Td 03/26/2009  . Zoster 05/16/2011    Health Maintenance  Topic Date Due  . INFLUENZA VACCINE  01/10/2017  . TETANUS/TDAP  03/27/2019  . COLONOSCOPY  08/11/2020  . Hepatitis C Screening  Completed  . PNA vac Low Risk Adult  Completed    Lab Results  Component Value Date   WBC 4.0 01/27/2016   HGB 15.1  01/27/2016   HCT 44.6 01/27/2016   PLT 129.0 (L) 01/27/2016   GLUCOSE 133 (H) 02/07/2016   CHOL 116 01/30/2017   TRIG 84.0 01/30/2017   HDL 48.60 01/30/2017   LDLCALC 50 01/30/2017   ALT 55 (H) 02/07/2016   AST 48 (H) 02/07/2016   NA 140 02/07/2016   K 4.2 02/07/2016   CL 101 02/07/2016   CREATININE 1.29 02/07/2016   BUN 16 02/07/2016   CO2 29 02/07/2016   TSH 1.34 01/30/2017   PSA 0.75 01/30/2017   INR 1.05 07/04/2012   HGBA1C 5.7 01/30/2017    Lab Results  Component Value Date  TSH 1.34 01/30/2017   Lab Results  Component Value Date   WBC 4.0 01/27/2016   HGB 15.1 01/27/2016   HCT 44.6 01/27/2016   MCV 91.3 01/27/2016   PLT 129.0 (L) 01/27/2016   Lab Results  Component Value Date   NA 140 02/07/2016   K 4.2 02/07/2016   CO2 29 02/07/2016   GLUCOSE 133 (H) 02/07/2016   BUN 16 02/07/2016   CREATININE 1.29 02/07/2016   BILITOT 0.9 02/07/2016   ALKPHOS 55 02/07/2016   AST 48 (H) 02/07/2016   ALT 55 (H) 02/07/2016   PROT 7.4 02/07/2016   ALBUMIN 4.7 02/07/2016   CALCIUM 9.5 02/07/2016   GFR 59.15 (L) 02/07/2016   Lab Results  Component Value Date   CHOL 116 01/30/2017   Lab Results  Component Value Date   HDL 48.60 01/30/2017   Lab Results  Component Value Date   LDLCALC 50 01/30/2017   Lab Results  Component Value Date   TRIG 84.0 01/30/2017   Lab Results  Component Value Date   CHOLHDL 2 01/30/2017   Lab Results  Component Value Date   HGBA1C 5.7 01/30/2017         Assessment & Plan:   Problem List Items Addressed This Visit    Hyperglycemia    hgba1c acceptable, minimize simple carbs. Increase exercise as tolerated.      Relevant Orders   Hemoglobin A1c (Completed)   Hypertension    Well controlled, no changes to meds. Encouraged heart healthy diet such as the DASH diet and exercise as tolerated.       Relevant Orders   TSH (Completed)   Preventative health care    Patient encouraged to maintain heart healthy diet,  regular exercise, adequate sleep. Consider daily probiotics. Take medications as prescribed. Has advanced directives will bring [place] a copy. Labs ordered and reviewed      Relevant Orders   PSA, Medicare (Completed)   Allergic state    Zyrtec/Cetirizine 10 mg daily as needed      Hyperlipidemia - Primary    Encouraged heart healthy diet, increase exercise, avoid trans fats, consider a krill oil cap daily      Relevant Orders   Lipid panel (Completed)      I have discontinued Mr. Schnick ranitidine. I am also having him maintain his methadone, multivitamin, naproxen, lisinopril, hepatitis b vaccine for neonates, fluocinonide gel, cyclobenzaprine, and magic mouthwash.  Meds ordered this encounter  Medications  . magic mouthwash SOLN    Sig: Take 5 mLs by mouth 2 (two) times daily.    Dispense:  240 mL    Refill:  1    CMA served as scribe during this visit. History, Physical and Plan performed by medical provider. Documentation and orders reviewed and attested to.  Penni Homans, MD

## 2017-01-30 NOTE — Patient Instructions (Addendum)
Consider Cetirizne/Zyrtec 10 mg daily for allergic.  Shingrix is the new shingles it is 2 shots over 6 months, cheapest at pharmacy, Preventive Care 67 Years and Older, Male Preventive care refers to lifestyle choices and visits with your health care provider that can promote health and wellness. What does preventive care include?  A yearly physical exam. This is also called an annual well check.  Dental exams once or twice a year.  Routine eye exams. Ask your health care provider how often you should have your eyes checked.  Personal lifestyle choices, including: ? Daily care of your teeth and gums. ? Regular physical activity. ? Eating a healthy diet. ? Avoiding tobacco and drug use. ? Limiting alcohol use. ? Practicing safe sex. ? Taking low doses of aspirin every day. ? Taking vitamin and mineral supplements as recommended by your health care provider. What happens during an annual well check? The services and screenings done by your health care provider during your annual well check will depend on your age, overall health, lifestyle risk factors, and family history of disease. Counseling Your health care provider may ask you questions about your:  Alcohol use.  Tobacco use.  Drug use.  Emotional well-being.  Home and relationship well-being.  Sexual activity.  Eating habits.  History of falls.  Memory and ability to understand (cognition).  Work and work Statistician.  Screening You may have the following tests or measurements:  Height, weight, and BMI.  Blood pressure.  Lipid and cholesterol levels. These may be checked every 5 years, or more frequently if you are over 30 years old.  Skin check.  Lung cancer screening. You may have this screening every year starting at age 19 if you have a 30-pack-year history of smoking and currently smoke or have quit within the past 15 years.  Fecal occult blood test (FOBT) of the stool. You may have this test every  year starting at age 32.  Flexible sigmoidoscopy or colonoscopy. You may have a sigmoidoscopy every 5 years or a colonoscopy every 10 years starting at age 29.  Prostate cancer screening 67 Recommendations will vary depending on your family history and other risks.  Hepatitis C blood test.  Hepatitis B blood test.  Sexually transmitted disease (STD) testing.  Diabetes screening. This is done by checking your blood sugar (glucose) after you have not eaten for a while (fasting). You may have this done every 1-3 years.  Abdominal aortic aneurysm (AAA) screening. You may need this if you are a current or former smoker.  Osteoporosis. You may be screened starting at age 67 if you are at high risk.  Talk with your health care provider about your test results, treatment options, and if necessary, the need for more tests. Vaccines Your health care provider may recommend certain vaccines, such as:  Influenza vaccine. This is recommended every year.  Tetanus, diphtheria, and acellular pertussis (Tdap, Td) vaccine. You may need a Td booster every 10 years.  Varicella vaccine. You may need this if you have not been vaccinated.  Zoster vaccine. You may need this after age 67.  Measles, mumps, and rubella (MMR) vaccine. You may need at least one dose of MMR if you were born in 1957 or later. You may also need a second dose.  Pneumococcal 13-valent conjugate (PCV13) vaccine. One dose is recommended after age 19.  Pneumococcal polysaccharide (PPSV23) vaccine. One dose is recommended after age 67.  Meningococcal vaccine. You may need this if you have certain conditions.  Hepatitis A vaccine. You may need this if you have certain conditions or if you travel or work in places where you may be exposed to hepatitis A.  Hepatitis B vaccine. You may need this if you have certain conditions or if you travel or work in places where you may be exposed to hepatitis B.  Haemophilus influenzae type b  (Hib) vaccine. You may need this if you have certain risk factors.  Talk to your health care provider about which screenings and vaccines you need and how often you need them. This information is not intended to replace advice given to you by your health care provider. Make sure you discuss any questions you have with your health care provider. Document Released: 06/25/2015 Document Revised: 02/16/2016 Document Reviewed: 03/30/2015 Elsevier Interactive Patient Education  2017 [employer]. symptoms

## 2017-01-30 NOTE — Assessment & Plan Note (Signed)
hgba1c acceptable, minimize simple carbs. Increase exercise as tolerated.  

## 2017-02-02 ENCOUNTER — Telehealth: Payer: Self-pay | Admitting: Family Medicine

## 2017-02-02 NOTE — Telephone Encounter (Signed)
Caller name: Candace at [facility] be reached: [phone] Fax [phone]  Reason for call: Pt called pharmacy to check on status of RX for lisinopril. Per Candace they have not recd RX. Pt stated it was ordered 01/30/17 by Dr. Charlett Blake.

## 2017-02-04 ENCOUNTER — Encounter: Payer: Self-pay | Admitting: Family Medicine

## 2017-02-04 DIAGNOSIS — E785 Hyperlipidemia, unspecified: Secondary | ICD-10-CM | POA: Insufficient documentation

## 2017-02-04 NOTE — Assessment & Plan Note (Signed)
Encouraged heart healthy diet, increase exercise, avoid trans fats, consider a krill oil cap daily 

## 2017-02-05 MED ORDER — LISINOPRIL 2.5 MG PO TABS: 2.5000 mg | ORAL_TABLET | Freq: Every day | ORAL | 5 refills | Status: DC

## 2017-02-05 NOTE — Telephone Encounter (Signed)
Rx sent 

## 2017-04-09 ENCOUNTER — Other Ambulatory Visit: Payer: Self-pay | Admitting: Family Medicine

## 2017-04-09 DIAGNOSIS — Z8719 Personal history of other diseases of the digestive system: Secondary | ICD-10-CM

## 2017-05-29 ENCOUNTER — Telehealth: Payer: Self-pay | Admitting: Family Medicine

## 2017-05-29 MED ORDER — CYCLOBENZAPRINE HCL 10 MG PO TABS: ORAL_TABLET | ORAL | 1 refills | Status: DC

## 2017-05-29 NOTE — Telephone Encounter (Signed)
Controlled substance 

## 2017-05-29 NOTE — Telephone Encounter (Signed)
Copied from [address] [id number]. Topic: Quick Communication - See Telephone Encounter >> May 29, 2017 10:10 AM Burnis Medin, NT wrote: CRM for notification. See Telephone encounter for: Pt is calling to see if he can get a refill for cyclobenzaprine (FLEXERIL) 10 MG tablet. Pt uses [facility], [place] - [id number]

## 2017-05-30 NOTE — Telephone Encounter (Signed)
Rx sent 05/29/2017.

## 2017-07-16 DIAGNOSIS — H11153 Pinguecula, bilateral: Secondary | ICD-10-CM | POA: Diagnosis not present

## 2017-07-16 DIAGNOSIS — H524 Presbyopia: Secondary | ICD-10-CM | POA: Diagnosis not present

## 2017-07-16 DIAGNOSIS — H2513 Age-related nuclear cataract, bilateral: Secondary | ICD-10-CM | POA: Diagnosis not present

## 2017-08-02 ENCOUNTER — Encounter: Payer: Self-pay | Admitting: Family Medicine

## 2017-08-02 ENCOUNTER — Ambulatory Visit: Payer: Medicare Other | Admitting: Family Medicine

## 2017-08-02 VITALS — BP 146/80 | HR 80 | Temp 98.1°F | Resp 18 | Ht 72.0 in | Wt 171.8 lb

## 2017-08-02 DIAGNOSIS — Z23 Encounter for immunization: Secondary | ICD-10-CM | POA: Diagnosis not present

## 2017-08-02 DIAGNOSIS — R3911 Hesitancy of micturition: Secondary | ICD-10-CM | POA: Diagnosis not present

## 2017-08-02 DIAGNOSIS — E785 Hyperlipidemia, unspecified: Secondary | ICD-10-CM

## 2017-08-02 DIAGNOSIS — R739 Hyperglycemia, unspecified: Secondary | ICD-10-CM | POA: Diagnosis not present

## 2017-08-02 DIAGNOSIS — D696 Thrombocytopenia, unspecified: Secondary | ICD-10-CM | POA: Diagnosis not present

## 2017-08-02 DIAGNOSIS — I10 Essential (primary) hypertension: Secondary | ICD-10-CM | POA: Diagnosis not present

## 2017-08-02 MED ORDER — LISINOPRIL 2.5 MG PO TABS: 2.5000 mg | ORAL_TABLET | Freq: Every day | ORAL | 5 refills | Status: DC

## 2017-08-02 MED ORDER — MAGIC MOUTHWASH: 5.0000 mL | Freq: Two times a day (BID) | ORAL | 1 refills | Status: DC

## 2017-08-02 MED ORDER — LISINOPRIL 5 MG PO TABS: 5.0000 mg | ORAL_TABLET | Freq: Every day | ORAL | 1 refills | Status: DC

## 2017-08-02 NOTE — Assessment & Plan Note (Signed)
Check UA 

## 2017-08-02 NOTE — Patient Instructions (Addendum)
Shingrix is the new shingles shot 2 shots over 2-6 months at the pharmacy Hypertension Hypertension, commonly called high blood pressure, is when the force of blood pumping through the arteries is too strong. The arteries are the blood vessels that carry blood from the heart throughout the body. Hypertension forces the heart to work harder to pump blood and may cause arteries to become narrow or stiff. Having untreated or uncontrolled hypertension can cause heart attacks, strokes, kidney disease, and other problems. A blood pressure reading consists of a higher number over a lower number. Ideally, your blood pressure should be below 120/80. The first ("top") number is called the systolic pressure. It is a measure of the pressure in your arteries as your heart beats. The second ("bottom") number is called the diastolic pressure. It is a measure of the pressure in your arteries as the heart relaxes. What are the causes? The cause of this condition is not known. What increases the risk? Some risk factors for high blood pressure are under your control. Others are not. Factors you can change  Smoking.  Having type 2 diabetes mellitus, high cholesterol, or both.  Not getting enough exercise or physical activity.  Being overweight.  Having too much fat, sugar, calories, or salt (sodium) in your diet.  Drinking too much alcohol. Factors that are difficult or impossible to change  Having chronic kidney disease.  Having a family history of high blood pressure.  Age. Risk increases with age.  Race. You may be at higher risk if you are African-American.  Gender. Men are at higher risk than women before age 22. After age 78, women are at higher risk than men.  Having obstructive sleep apnea.  Stress. What are the signs or symptoms? Extremely high blood pressure (hypertensive crisis) may cause:  Headache.  Anxiety.  Shortness of breath.  Nosebleed.  Nausea and vomiting.  Severe  chest pain.  Jerky movements you cannot control (seizures).  How is this diagnosed? This condition is diagnosed by measuring your blood pressure while you are seated, with your arm resting on a surface. The cuff of the blood pressure monitor will be placed directly against the skin of your upper arm at the level of your heart. It should be measured at least twice using the same arm. Certain conditions can cause a difference in blood pressure between your right and left arms. Certain factors can cause blood pressure readings to be lower or higher than normal (elevated) for a short period of time:  When your blood pressure is higher when you are in a health care provider's office than when you are at home, this is called white coat hypertension. Most people with this condition do not need medicines.  When your blood pressure is higher at home than when you are in a health care provider's office, this is called masked hypertension. Most people with this condition may need medicines to control blood pressure.  If you have a high blood pressure reading during one visit or you have normal blood pressure with other risk factors:  You may be asked to return on a different day to have your blood pressure checked again.  You may be asked to monitor your blood pressure at home for 1 week or longer.  If you are diagnosed with hypertension, you may have other blood or imaging tests to help your health care provider understand your overall risk for other conditions. How is this treated? This condition is treated by making healthy lifestyle  changes, such as eating healthy foods, exercising more, and reducing your alcohol intake. Your health care provider may prescribe medicine if lifestyle changes are not enough to get your blood pressure under control, and if:  Your systolic blood pressure is above 130.  Your diastolic blood pressure is above 80.  Your personal target blood pressure may vary depending on  your medical conditions, your age, and other factors. Follow these instructions at home: Eating and drinking  Eat a diet that is high in fiber and potassium, and low in sodium, added sugar, and fat. An example eating plan is called the DASH (Dietary Approaches to Stop Hypertension) diet. To eat this way: ? Eat plenty of fresh fruits and vegetables. Try to fill half of your plate at each meal with fruits and vegetables. ? Eat whole grains, such as whole wheat pasta, brown rice, or whole grain bread. Fill about one quarter of your plate with whole grains. ? Eat or drink low-fat dairy products, such as skim milk or low-fat yogurt. ? Avoid fatty cuts of meat, processed or cured meats, and poultry with skin. Fill about one quarter of your plate with lean proteins, such as fish, chicken without skin, beans, eggs, and tofu. ? Avoid premade and processed foods. These tend to be higher in sodium, added sugar, and fat.  Reduce your daily sodium intake. Most people with hypertension should eat less than 1,500 mg of sodium a day.  Limit alcohol intake to no more than 1 drink a day for nonpregnant women and 2 drinks a day for men. One drink equals 12 oz of beer, 5 oz of wine, or 1 oz of hard liquor. Lifestyle  Work with your health care provider to maintain a healthy body weight or to lose weight. Ask what an ideal weight is for you.  Get at least 30 minutes of exercise that causes your heart to beat faster (aerobic exercise) most days of the week. Activities may include walking, swimming, or biking.  Include exercise to strengthen your muscles (resistance exercise), such as pilates or lifting weights, as part of your weekly exercise routine. Try to do these types of exercises for 30 minutes at least 3 days a week.  Do not use any products that contain nicotine or tobacco, such as cigarettes and e-cigarettes. If you need help quitting, ask your health care provider.  Monitor your blood pressure at home  as told by your health care provider.  Keep all follow-up visits as told by your health care provider. This is important. Medicines  Take over-the-counter and prescription medicines only as told by your health care provider. Follow directions carefully. Blood pressure medicines must be taken as prescribed.  Do not skip doses of blood pressure medicine. Doing this puts you at risk for problems and can make the medicine less effective.  Ask your health care provider about side effects or reactions to medicines that you should watch for. Contact a health care provider if:  You think you are having a reaction to a medicine you are taking.  You have headaches that keep coming back (recurring).  You feel dizzy.  You have swelling in your ankles.  You have trouble with your vision. Get help right away if:  You develop a severe headache or confusion.  You have unusual weakness or numbness.  You feel faint.  You have severe pain in your chest or abdomen.  You vomit repeatedly.  You have trouble breathing. Summary  Hypertension is when the force  of blood pumping through your arteries is too strong. If this condition is not controlled, it may put you at risk for serious complications.  Your personal target blood pressure may vary depending on your medical conditions, your age, and other factors. For most people, a normal blood pressure is less than 120/80.  Hypertension is treated with lifestyle changes, medicines, or a combination of both. Lifestyle changes include weight loss, eating a healthy, low-sodium diet, exercising more, and limiting alcohol. This information is not intended to replace advice given to you by your health care provider. Make sure you discuss any questions you have with your health care provider. Document Released: 05/29/2005 Document Revised: 04/26/2016 Document Reviewed: 04/26/2016 Elsevier Interactive Patient Education  [employer].

## 2017-08-02 NOTE — Assessment & Plan Note (Signed)
CBC with next draw

## 2017-08-02 NOTE — Assessment & Plan Note (Signed)
Encouraged heart healthy diet, increase exercise, avoid trans fats, consider a krill oil cap daily 

## 2017-08-02 NOTE — Progress Notes (Signed)
Subjective:  I acted as a Education administrator for Dr. Charlett Blake. [place], [place]   Patient ID: Shaun [name], male    DOB: [birth year], 67 y.o.   MRN: [id number]  No chief complaint on file.   HPI  Patient is in today for a 6 month follow up and he is feeling well today. No recent hospitalizations but he did have a case of laryngitis in January but all of his symptoms have resolved. No acute concerns. Denies CP/palp/SOB/HA/congestion/fevers/GI or GU c/o. Taking meds as prescribed  Patient Care Team: [name], MD as PCP - General (Family Medicine)   Past Medical History:  Diagnosis Date  . Allergic state 01/30/2017  . Arm mass   . Arm skin lesion, left 11/12/2012   And sun damaged skin  . Elevated BP 11/14/2012  . Encounter for Medicare annual wellness exam 01/27/2016  . GERD (gastroesophageal reflux disease) 01/27/2016  . Hepatitis C, chronic ([place])   . History of intravenous drug use in remission   . History of oral lesions 05/14/2013  . History of oral lesions 07/29/2015  . Hypertension 11/14/2012  . Left shoulder pain 12/16/2012  . 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 Myalgia and myositis 08/13/2014  . Preventative health care 05/14/2013  . Preventative health care 05/14/2013    Past Surgical History:  Procedure Laterality Date  . KNEE ARTHROSCOPY  1996   ligment repair    Family History  Problem Relation Age of Onset  . Heart disease Father   . Diabetes Father   . Hyperlipidemia Father   . Hypertension Father   . Thyroid disease Sister   . Heart disease Cousin        MI  . Diabetes Cousin   . Obesity Cousin   . Osteoporosis Mother   . Diabetes Paternal Grandfather   . Thyroid disease Maternal Grandmother   . Cancer Neg Hx        negative for colon and prostate    Social History   Socioeconomic History  . Marital status: Married    Spouse name: Not on file  . Number of children: Not on file  . Years of education: Not on file  . Highest education level: Not on file  Social  Needs  . Financial resource strain: Not on file  . Food insecurity - worry: Not on file  . Food insecurity - inability: Not on file  . Transportation needs - medical: Not on file  . Transportation needs - non-medical: Not on file  Occupational History  . Not on file  Tobacco Use  . Smoking status: Never Smoker  . Smokeless tobacco: Never Used  Substance and Sexual Activity  . Alcohol use: No    Alcohol/week: 0.0 oz  . Drug use: No  . Sexual activity: Yes    Comment: avoids nuts, fruits, lives with wife, makes picture frames, building maintenance  Other Topics Concern  . Not on file  Social History Narrative  . Not on file    Outpatient Medications Prior to Visit  Medication Sig Dispense Refill  . cyclobenzaprine (FLEXERIL) 10 MG tablet Take ONE (1) tablet by mouth twice daily as needed for MUSCLE SPASMS 30 tablet 1  . fluocinonide gel (LIDEX) 0.05 % APPLY TO TONGUE TWICE DAILY AS NEEDED 60 g 0  . hepatitis b vaccine for neonates (ENGERIX-B) 10 MCG/0.5ML injection Inject 0.5 mLs into the muscle once.    . methadone (DOLOPHINE) 10 MG/5ML solution Take 8 mg by mouth every morning.     Marland Kitchen  Multiple Vitamin (MULTIVITAMIN) tablet Take 1 tablet by mouth every morning.     . naproxen (NAPROSYN) 500 MG tablet Take 1 tablet (500 mg total) by mouth 2 (two) times daily with a meal. 60 tablet 1  . lisinopril (PRINIVIL,ZESTRIL) 2.5 MG tablet Take 1 tablet (2.5 mg total) by mouth daily. 30 tablet 5  . magic mouthwash SOLN Take 5 mLs by mouth 2 (two) times daily. 240 mL 1   No facility-administered medications prior to visit.     No Known Allergies  Review of Systems  Constitutional: Negative for fever and malaise/fatigue.  HENT: Negative for congestion.   Eyes: Negative for blurred vision.  Respiratory: Negative for shortness of breath.   Cardiovascular: Negative for chest pain, palpitations and leg swelling.  Gastrointestinal: Negative for abdominal pain, blood in stool and nausea.    Genitourinary: Negative for dysuria and frequency.  Musculoskeletal: Negative for falls.  Skin: Negative for rash.  Neurological: Negative for dizziness, loss of consciousness and headaches.  Endo/Heme/Allergies: Negative for environmental allergies.  Psychiatric/Behavioral: Negative for depression. The patient is not nervous/anxious.        Objective:    Physical Exam  Constitutional: He is oriented to person, place, and time. He appears well-developed and well-nourished. No distress.  HENT:  Head: Normocephalic and atraumatic.  Nose: Nose normal.  Eyes: Right eye exhibits no discharge. Left eye exhibits no discharge.  Neck: Normal range of motion. Neck supple.  Cardiovascular: Normal rate and regular rhythm.  No murmur heard. Pulmonary/Chest: Effort normal and breath sounds normal.  Abdominal: Soft. Bowel sounds are normal. There is no tenderness.  Musculoskeletal: He exhibits no edema.  Neurological: He is alert and oriented to person, place, and time.  Skin: Skin is warm and dry.  Psychiatric: He has a normal mood and affect.  Nursing note and vitals reviewed.   BP (!) 152/80 (BP Location: Left Arm, Patient Position: Sitting, Cuff Size: Normal)   Pulse 80   Temp 98.1 F (36.7 C) (Oral)   Resp 18   Ht 6' (1.829 m)   Wt 171 lb 12.8 oz (77.9 kg)   SpO2 99%   BMI 23.30 kg/m  Wt Readings from Last 3 Encounters:  08/02/17 171 lb 12.8 oz (77.9 kg)  01/30/17 170 lb 6.4 oz (77.3 kg)  01/27/16 168 lb 2 oz (76.3 kg)   BP Readings from Last 3 Encounters:  08/02/17 (!) 152/80  01/30/17 140/70  01/27/16 (!) 142/82     Immunization History  Administered Date(s) Administered  . Hepatitis B, adult 05/01/2016, 06/01/2016, 10/31/2016  . Influenza Split 05/16/2011, 05/14/2012  . Influenza Whole 03/26/2009  . Influenza, High Dose Seasonal PF 05/01/2016, 08/02/2017  . Influenza,inj,Quad PF,6+ Mos 05/13/2013, 07/21/2014, 07/29/2015  . Pneumococcal Conjugate-13 07/29/2015  .  Pneumococcal Polysaccharide-23 07/20/2008, 01/30/2017  . Td 03/26/2009  . Zoster 05/16/2011    Health Maintenance  Topic Date Due  . TETANUS/TDAP  03/27/2019  . COLONOSCOPY  08/11/2020  . INFLUENZA VACCINE  Completed  . Hepatitis C Screening  Completed  . PNA vac Low Risk Adult  Completed    Lab Results  Component Value Date   WBC 4.0 01/27/2016   HGB 15.1 01/27/2016   HCT 44.6 01/27/2016   PLT 129.0 (L) 01/27/2016   GLUCOSE 133 (H) 02/07/2016   CHOL 116 01/30/2017   TRIG 84.0 01/30/2017   HDL 48.60 01/30/2017   LDLCALC 50 01/30/2017   ALT 55 (H) 02/07/2016   AST 48 (H) 02/07/2016   NA  140 02/07/2016   K 4.2 02/07/2016   CL 101 02/07/2016   CREATININE 1.29 02/07/2016   BUN 16 02/07/2016   CO2 29 02/07/2016   TSH 1.34 01/30/2017   PSA 0.75 01/30/2017   INR 1.05 07/04/2012   HGBA1C 5.7 01/30/2017    Lab Results  Component Value Date   TSH 1.34 01/30/2017   Lab Results  Component Value Date   WBC 4.0 01/27/2016   HGB 15.1 01/27/2016   HCT 44.6 01/27/2016   MCV 91.3 01/27/2016   PLT 129.0 (L) 01/27/2016   Lab Results  Component Value Date   NA 140 02/07/2016   K 4.2 02/07/2016   CO2 29 02/07/2016   GLUCOSE 133 (H) 02/07/2016   BUN 16 02/07/2016   CREATININE 1.29 02/07/2016   BILITOT 0.9 02/07/2016   ALKPHOS 55 02/07/2016   AST 48 (H) 02/07/2016   ALT 55 (H) 02/07/2016   PROT 7.4 02/07/2016   ALBUMIN 4.7 02/07/2016   CALCIUM 9.5 02/07/2016   GFR 59.15 (L) 02/07/2016   Lab Results  Component Value Date   CHOL 116 01/30/2017   Lab Results  Component Value Date   HDL 48.60 01/30/2017   Lab Results  Component Value Date   LDLCALC 50 01/30/2017   Lab Results  Component Value Date   TRIG 84.0 01/30/2017   Lab Results  Component Value Date   CHOLHDL 2 01/30/2017   Lab Results  Component Value Date   HGBA1C 5.7 01/30/2017         Assessment & Plan:   Problem List Items Addressed This Visit    Hyperglycemia    minimize simple carbs.  Increase exercise as tolerated.      Relevant Orders   Hemoglobin A1c   TSH   Hypertension    no changes to meds. Encouraged heart healthy diet such as the DASH diet and exercise as tolerated.       Relevant Medications   lisinopril (PRINIVIL,ZESTRIL) 5 MG tablet   Other Relevant Orders   Comprehensive metabolic panel   TSH   Urinary hesitancy    Check UA      Relevant Orders   Urinalysis   PSA   Thrombocytopenia (HCC)    CBC with next draw      Relevant Orders   CBC   Hyperlipidemia    Encouraged heart healthy diet, increase exercise, avoid trans fats, consider a krill oil cap daily      Relevant Medications   lisinopril (PRINIVIL,ZESTRIL) 5 MG tablet   Other Relevant Orders   Lipid panel    Other Visit Diagnoses    Needs flu shot    -  Primary   Relevant Orders   Flu vaccine HIGH DOSE PF (Fluzone High dose) (Completed)      I have discontinued Aarib Ojeda's lisinopril and lisinopril. I am also having him start on lisinopril. Additionally, I am having him maintain his methadone, multivitamin, naproxen, hepatitis b vaccine, fluocinonide gel, cyclobenzaprine, and magic mouthwash.  Meds ordered this encounter  Medications  . DISCONTD: lisinopril (PRINIVIL,ZESTRIL) 2.5 MG tablet    Sig: Take 1 tablet (2.5 mg total) by mouth daily.    Dispense:  30 tablet    Refill:  5  . magic mouthwash SOLN    Sig: Take 5 mLs by mouth 2 (two) times daily.    Dispense:  240 mL    Refill:  1  . lisinopril (PRINIVIL,ZESTRIL) 5 MG tablet    Sig: Take 1 tablet (  5 mg total) by mouth daily.    Dispense:  90 tablet    Refill:  1    CMA served as Education administrator during this visit. History, Physical and Plan performed by medical provider. Documentation and orders reviewed and attested to.  Penni Homans, MD

## 2017-08-02 NOTE — Assessment & Plan Note (Signed)
minimize simple carbs. Increase exercise as tolerated.  

## 2017-08-02 NOTE — Assessment & Plan Note (Signed)
no changes to meds. Encouraged heart healthy diet such as the DASH diet and exercise as tolerated.  

## 2017-09-25 ENCOUNTER — Ambulatory Visit (INDEPENDENT_AMBULATORY_CARE_PROVIDER_SITE_OTHER): Payer: Medicare Other | Admitting: Family Medicine

## 2017-09-25 VITALS — BP 144/86 | HR 65

## 2017-09-25 DIAGNOSIS — I10 Essential (primary) hypertension: Secondary | ICD-10-CM

## 2017-09-25 MED ORDER — LISINOPRIL 10 MG PO TABS: 10.0000 mg | ORAL_TABLET | Freq: Every day | ORAL | 1 refills | Status: DC

## 2017-09-25 NOTE — Progress Notes (Signed)
Nurseblood pressure check note reviewed. Agree with documention and plan. 

## 2017-09-25 NOTE — Patient Instructions (Signed)
BP still elevated. Increase Lisinopril to 10mg  daily. BP check in 2 weeks. Please schedule a nurse visit.

## 2017-09-25 NOTE — Progress Notes (Signed)
Pre visit review using our clinic review tool, if applicable. No additional management support is needed unless otherwise documented below in the visit note.  Pt walked into office today stating Dr. Charlett Blake told him to come for BP check. Chart reviewed, last OV 08/02/2017 BP was elevated at 146/80- lisinopril 5mg  added to med regimen and Pt instructed to schedule BP check in 1 month.   BP today in L arm: 144/86 Pulse: 65  BP in L arm after 10 minutes: 149/82 Pulse: 65  Per Dr. Charlett Blake- change to Lisinopril 10mg  daily. Recheck BP in 2 weeks.   Pt verbalized understanding.    Nursing blood pressure check note reviewed. Agree with documention and plan.

## 2017-10-09 ENCOUNTER — Ambulatory Visit: Payer: Medicare Other

## 2017-10-09 VITALS — BP 140/80 | HR 62

## 2017-10-09 DIAGNOSIS — I10 Essential (primary) hypertension: Secondary | ICD-10-CM

## 2017-10-09 NOTE — Progress Notes (Signed)
Patient in today for a blood pressure check her currently takes Lisinopril 10mg  daily    Blood Pressure today 140/80 Pulse: 62  Takes bp at home he is seeing low 130's on top and 75-80 on the bottom.    Patient was advised by Dr. Charlett Blake to add  another Lisinopril daily and recheck in about 1 month also with lab work, a CMP.  Patient was advised, he stated he would rather continue on doing what he was doing at home and use my chart to update PCP on his blood pressure readings.

## 2017-11-16 DIAGNOSIS — Z79891 Long term (current) use of opiate analgesic: Secondary | ICD-10-CM | POA: Diagnosis not present

## 2017-11-20 ENCOUNTER — Ambulatory Visit (INDEPENDENT_AMBULATORY_CARE_PROVIDER_SITE_OTHER): Payer: Medicare Other | Admitting: Family Medicine

## 2017-11-20 ENCOUNTER — Other Ambulatory Visit (INDEPENDENT_AMBULATORY_CARE_PROVIDER_SITE_OTHER): Payer: Medicare Other

## 2017-11-20 DIAGNOSIS — I10 Essential (primary) hypertension: Secondary | ICD-10-CM | POA: Diagnosis not present

## 2017-11-20 LAB — COMPREHENSIVE METABOLIC PANEL
ALBUMIN: 4.6 g/dL (ref 3.5–5.2)
ALK PHOS: 55 U/L (ref 39–117)
ALT: 12 U/L (ref 0–53)
AST: 18 U/L (ref 0–37)
BILIRUBIN TOTAL: 0.7 mg/dL (ref 0.2–1.2)
BUN: 18 mg/dL (ref 6–23)
CO2: 29 mEq/L (ref 19–32)
CREATININE: 1.2 mg/dL (ref 0.40–1.50)
Calcium: 9.6 mg/dL (ref 8.4–10.5)
Chloride: 102 mEq/L (ref 96–112)
GFR: 63.95 mL/min (ref >60.00)
Glucose, Bld: 126 mg/dL — ABNORMAL HIGH (ref 70–99)
Potassium: 4.1 mEq/L (ref 3.5–5.1)
Sodium: 138 mEq/L (ref 135–145)
TOTAL PROTEIN: 6.8 g/dL (ref 6.0–8.3)

## 2017-11-20 NOTE — Progress Notes (Signed)
Nursing blood pressure check note reviewed. Agree with documention and plan. 

## 2017-11-20 NOTE — Progress Notes (Signed)
Pre visit review using our clinic tool,if applicable. No additional management support is needed unless otherwise documented below in the visit note.   Pt here for Blood pressure check per order from Dr. Penni Homans dated 10/09/17.  Pt currently takes: Lisinopril 10 mg daily. No complaints voiced this visit.   Pt reports compliance with medication.  BP today =133/82 HR = 69  Pt advised per Dr. Charlett Blake to continue taking medication as ordered have CMP drawn today and return for scheduled follow up with provider. Patient agreed.

## 2017-12-04 ENCOUNTER — Other Ambulatory Visit: Payer: Self-pay | Admitting: Family Medicine

## 2017-12-04 ENCOUNTER — Telehealth: Payer: Self-pay | Admitting: Family Medicine

## 2017-12-04 NOTE — Telephone Encounter (Signed)
Copied from [place] [id number]. Topic: Quick Communication - Rx Refill/Question >> Dec 04, 2017  2:01 PM Waldemar Dickens, Sade R wrote: Medication: lisinopril (PRINIVIL,ZESTRIL) 10 MG tablet  Has the patient contacted their pharmacy? Yes (Agent: If no, request that the patient contact the pharmacy for the refill.) (Agent: If yes, when and what did the pharmacy advise?)  Preferred Pharmacy (with phone number or street name): [place], [place] - [address] [phone] (Phone) [phone] (Fax)      Agent: Please be advised that RX refills may take up to 3 business days. We ask that you follow-up with your pharmacy.

## 2017-12-05 NOTE — Telephone Encounter (Signed)
Attempted to call pt, VM full; RX is at pharmacy, filled this am.

## 2017-12-14 DIAGNOSIS — Z79891 Long term (current) use of opiate analgesic: Secondary | ICD-10-CM | POA: Diagnosis not present

## 2018-01-01 DIAGNOSIS — Z79891 Long term (current) use of opiate analgesic: Secondary | ICD-10-CM | POA: Diagnosis not present

## 2018-01-11 DIAGNOSIS — Z79891 Long term (current) use of opiate analgesic: Secondary | ICD-10-CM | POA: Diagnosis not present

## 2018-01-15 ENCOUNTER — Other Ambulatory Visit (INDEPENDENT_AMBULATORY_CARE_PROVIDER_SITE_OTHER): Payer: Medicare Other

## 2018-01-15 DIAGNOSIS — E785 Hyperlipidemia, unspecified: Secondary | ICD-10-CM | POA: Diagnosis not present

## 2018-01-15 DIAGNOSIS — I10 Essential (primary) hypertension: Secondary | ICD-10-CM | POA: Diagnosis not present

## 2018-01-15 DIAGNOSIS — R3911 Hesitancy of micturition: Secondary | ICD-10-CM

## 2018-01-15 DIAGNOSIS — D696 Thrombocytopenia, unspecified: Secondary | ICD-10-CM

## 2018-01-15 DIAGNOSIS — R739 Hyperglycemia, unspecified: Secondary | ICD-10-CM | POA: Diagnosis not present

## 2018-01-15 LAB — PSA: PSA: 0.97 ng/mL (ref 0.10–4.00)

## 2018-01-15 LAB — URINALYSIS
BILIRUBIN URINE: NEGATIVE
HGB URINE DIPSTICK: NEGATIVE
Ketones, ur: NEGATIVE
Leukocytes, UA: NEGATIVE
Nitrite: NEGATIVE
Specific Gravity, Urine: <=1.005 — AB (ref 1.000–1.030)
TOTAL PROTEIN, URINE-UPE24: NEGATIVE
URINE GLUCOSE: NEGATIVE
Urobilinogen, UA: 0.2 (ref 0.0–1.0)
pH: 7 (ref 5.0–8.0)

## 2018-01-15 LAB — LIPID PANEL
CHOL/HDL RATIO: 2
CHOLESTEROL: 138 mg/dL (ref 0–200)
HDL: 56.1 mg/dL (ref >39.00)
LDL Cholesterol: 68 mg/dL (ref 0–99)
NonHDL: 81.64
Triglycerides: 68 mg/dL (ref 0.0–149.0)
VLDL: 13.6 mg/dL (ref 0.0–40.0)

## 2018-01-15 LAB — COMPREHENSIVE METABOLIC PANEL
ALBUMIN: 5 g/dL (ref 3.5–5.2)
ALK PHOS: 56 U/L (ref 39–117)
ALT: 12 U/L (ref 0–53)
AST: 17 U/L (ref 0–37)
BILIRUBIN TOTAL: 1 mg/dL (ref 0.2–1.2)
BUN: 15 mg/dL (ref 6–23)
CO2: 29 mEq/L (ref 19–32)
CREATININE: 1.16 mg/dL (ref 0.40–1.50)
Calcium: 10 mg/dL (ref 8.4–10.5)
Chloride: 98 mEq/L (ref 96–112)
GFR: 66.47 mL/min (ref >60.00)
Glucose, Bld: 95 mg/dL (ref 70–99)
Potassium: 4.3 mEq/L (ref 3.5–5.1)
SODIUM: 137 meq/L (ref 135–145)
TOTAL PROTEIN: 7.3 g/dL (ref 6.0–8.3)

## 2018-01-15 LAB — CBC
HEMATOCRIT: 42.7 % (ref 39.0–52.0)
Hemoglobin: 14.7 g/dL (ref 13.0–17.0)
MCHC: 34.3 g/dL (ref 30.0–36.0)
MCV: 90.6 fl (ref 78.0–100.0)
Platelets: 153 10*3/uL (ref 150.0–400.0)
RBC: 4.72 Mil/uL (ref 4.22–5.81)
RDW: 12.9 % (ref 11.5–15.5)
WBC: 4 10*3/uL (ref 4.0–10.5)

## 2018-01-15 LAB — TSH: TSH: 1.14 u[IU]/mL (ref 0.35–4.50)

## 2018-01-15 LAB — HEMOGLOBIN A1C: Hgb A1c MFr Bld: 5.9 % (ref 4.6–6.5)

## 2018-01-21 ENCOUNTER — Ambulatory Visit (INDEPENDENT_AMBULATORY_CARE_PROVIDER_SITE_OTHER): Payer: Medicare Other | Admitting: Family Medicine

## 2018-01-21 ENCOUNTER — Encounter: Payer: Self-pay | Admitting: Family Medicine

## 2018-01-21 DIAGNOSIS — E785 Hyperlipidemia, unspecified: Secondary | ICD-10-CM

## 2018-01-21 DIAGNOSIS — R3911 Hesitancy of micturition: Secondary | ICD-10-CM

## 2018-01-21 DIAGNOSIS — R739 Hyperglycemia, unspecified: Secondary | ICD-10-CM | POA: Diagnosis not present

## 2018-01-21 DIAGNOSIS — Z8719 Personal history of other diseases of the digestive system: Secondary | ICD-10-CM

## 2018-01-21 DIAGNOSIS — I10 Essential (primary) hypertension: Secondary | ICD-10-CM | POA: Diagnosis not present

## 2018-01-21 DIAGNOSIS — D696 Thrombocytopenia, unspecified: Secondary | ICD-10-CM

## 2018-01-21 MED ORDER — MAGIC MOUTHWASH: 5.0000 mL | Freq: Two times a day (BID) | ORAL | 1 refills | Status: DC

## 2018-01-21 MED ORDER — LISINOPRIL 10 MG PO TABS: 10.0000 mg | ORAL_TABLET | Freq: Every day | ORAL | 1 refills | Status: DC

## 2018-01-21 MED ORDER — FLUOCINONIDE 0.05 % EX GEL: CUTANEOUS | 0 refills | Status: DC

## 2018-01-21 NOTE — Assessment & Plan Note (Signed)
Encouraged heart healthy diet, increase exercise, avoid trans fats, consider a krill oil cap daily 

## 2018-01-21 NOTE — Assessment & Plan Note (Signed)
Asymptomatic, stable no changes

## 2018-01-21 NOTE — Patient Instructions (Addendum)
Shingrix is the new shingles shot 2 shots over 2-6 months at pharmacy  Hypertension Hypertension is another name for high blood pressure. High blood pressure forces your heart to work harder to pump blood. This can cause problems over time. There are two numbers in a blood pressure reading. There is a top number (systolic) over a bottom number (diastolic). It is best to have a blood pressure below 120/80. Healthy choices can help lower your blood pressure. You may need medicine to help lower your blood pressure if:  Your blood pressure cannot be lowered with healthy choices.  Your blood pressure is higher than 130/80.  Follow these instructions at home: Eating and drinking  If directed, follow the DASH eating plan. This diet includes: ? Filling half of your plate at each meal with fruits and vegetables. ? Filling one quarter of your plate at each meal with whole grains. Whole grains include whole wheat pasta, brown rice, and whole grain bread. ? Eating or drinking low-fat dairy products, such as skim milk or low-fat yogurt. ? Filling one quarter of your plate at each meal with low-fat (lean) proteins. Low-fat proteins include fish, skinless chicken, eggs, beans, and tofu. ? Avoiding fatty meat, cured and processed meat, or chicken with skin. ? Avoiding premade or processed food.  Eat less than 1,500 mg of salt (sodium) a day.  Limit alcohol use to no more than 1 drink a day for nonpregnant women and 2 drinks a day for men. One drink equals 12 oz of beer, 5 oz of wine, or 1 oz of hard liquor. Lifestyle  Work with your doctor to stay at a healthy weight or to lose weight. Ask your doctor what the best weight is for you.  Get at least 30 minutes of exercise that causes your heart to beat faster (aerobic exercise) most days of the week. This may include walking, swimming, or biking.  Get at least 30 minutes of exercise that strengthens your muscles (resistance exercise) at least 3 days a  week. This may include lifting weights or pilates.  Do not use any products that contain nicotine or tobacco. This includes cigarettes and e-cigarettes. If you need help quitting, ask your doctor.  Check your blood pressure at home as told by your doctor.  Keep all follow-up visits as told by your doctor. This is important. Medicines  Take over-the-counter and prescription medicines only as told by your doctor. Follow directions carefully.  Do not skip doses of blood pressure medicine. The medicine does not work as well if you skip doses. Skipping doses also puts you at risk for problems.  Ask your doctor about side effects or reactions to medicines that you should watch for. Contact a doctor if:  You think you are having a reaction to the medicine you are taking.  You have headaches that keep coming back (recurring).  You feel dizzy.  You have swelling in your ankles.  You have trouble with your vision. Get help right away if:  You get a very bad headache.  You start to feel confused.  You feel weak or numb.  You feel faint.  You get very bad pain in your: ? Chest. ? Belly (abdomen).  You throw up (vomit) more than once.  You have trouble breathing. Summary  Hypertension is another name for high blood pressure.  Making healthy choices can help lower blood pressure. If your blood pressure cannot be controlled with healthy choices, you may need to take medicine. This  information is not intended to replace advice given to you by your health care provider. Make sure you discuss any questions you have with your health care provider. Document Released: 11/15/2007 Document Revised: 04/26/2016 Document Reviewed: 04/26/2016 Elsevier Interactive Patient Education  [employer].

## 2018-01-21 NOTE — Assessment & Plan Note (Signed)
psa ordered

## 2018-01-21 NOTE — Assessment & Plan Note (Signed)
Well controlled, no changes to meds. Encouraged heart healthy diet such as the DASH diet and exercise as tolerated.  °

## 2018-01-21 NOTE — Assessment & Plan Note (Signed)
hgba1c acceptable, minimize simple carbs. Increase exercise as tolerated.  

## 2018-01-21 NOTE — Progress Notes (Signed)
Subjective:    Patient ID: Shaun [name], male    DOB: June 16, 1950, 67 y.o.   MRN: [id number]  No chief complaint on file.   HPI Patient is in today for follow up on chronic medical concerns and he feels well. No recent febile illness or hospitalizations. No polyuria or polydipsia. Is staying active and trying to eat well.  Denies CP/palp/SOB/HA/congestion/fevers/GI c/o. Taking meds as prescribed  Past Medical History:  Diagnosis Date  . Allergic state 01/30/2017  . Arm mass   . Arm skin lesion, left 11/12/2012   And sun damaged skin  . Elevated BP 11/14/2012  . Encounter for Medicare annual wellness exam 01/27/2016  . GERD (gastroesophageal reflux disease) 01/27/2016  . Hepatitis C, chronic ([place])   . History of intravenous drug use in remission   . History of oral lesions 05/14/2013  . History of oral lesions 07/29/2015  . Hypertension 11/14/2012  . Left shoulder pain 12/16/2012  . 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 Myalgia and myositis 08/13/2014  . Preventative health care 05/14/2013  . Preventative health care 05/14/2013    Past Surgical History:  Procedure Laterality Date  . KNEE ARTHROSCOPY  1996   ligment repair    Family History  Problem Relation Age of Onset  . Heart disease Father   . Diabetes Father   . Hyperlipidemia Father   . Hypertension Father   . Thyroid disease Sister   . Heart disease Cousin        MI  . Diabetes Cousin   . Obesity Cousin   . Osteoporosis Mother   . Diabetes Paternal Grandfather   . Thyroid disease Maternal Grandmother   . Cancer Neg Hx        negative for colon and prostate    Social History   Socioeconomic History  . Marital status: Married    Spouse name: Not on file  . Number of children: Not on file  . Years of education: Not on file  . Highest education level: Not on file  Occupational History  . Not on file  Social Needs  . Financial resource strain: Not on file  . Food insecurity:    Worry: Not on file    Inability:  Not on file  . Transportation needs:    Medical: Not on file    Non-medical: Not on file  Tobacco Use  . Smoking status: Never Smoker  . Smokeless tobacco: Never Used  Substance and Sexual Activity  . Alcohol use: No    Alcohol/week: 0.0 standard drinks  . Drug use: No  . Sexual activity: Yes    Comment: avoids nuts, fruits, lives with wife, makes picture frames, building maintenance  Lifestyle  . Physical activity:    Days per week: Not on file    Minutes per session: Not on file  . Stress: Not on file  Relationships  . Social connections:    Talks on phone: Not on file    Gets together: Not on file    Attends religious service: Not on file    Active member of club or organization: Not on file    Attends meetings of clubs or organizations: Not on file    Relationship status: Not on file  . Intimate partner violence:    Fear of current or ex partner: Not on file    Emotionally abused: Not on file    Physically abused: Not on file    Forced sexual activity: Not on file  Other  Topics Concern  . Not on file  Social History Narrative  . Not on file    Outpatient Medications Prior to Visit  Medication Sig Dispense Refill  . cyclobenzaprine (FLEXERIL) 10 MG tablet Take ONE (1) tablet by mouth twice daily as needed for MUSCLE SPASMS 30 tablet 1  . hepatitis b vaccine for neonates (ENGERIX-B) 10 MCG/0.5ML injection Inject 0.5 mLs into the muscle once.    . methadone (DOLOPHINE) 10 MG/5ML solution Take 8 mg by mouth every morning.     . Multiple Vitamin (MULTIVITAMIN) tablet Take 1 tablet by mouth every morning.     . naproxen (NAPROSYN) 500 MG tablet Take 1 tablet (500 mg total) by mouth 2 (two) times daily with a meal. 60 tablet 1  . fluocinonide gel (LIDEX) 0.05 % APPLY TO TONGUE TWICE DAILY AS NEEDED 60 g 0  . lisinopril (PRINIVIL,ZESTRIL) 10 MG tablet TAKE 1 TABLET BY MOUTH ONCE DAILY 30 tablet 1  . magic mouthwash SOLN Take 5 mLs by mouth 2 (two) times daily. 240 mL 1    No facility-administered medications prior to visit.     No Known Allergies  Review of Systems  Constitutional: Negative for fever and malaise/fatigue.  HENT: Negative for congestion.   Eyes: Negative for blurred vision.  Respiratory: Negative for shortness of breath.   Cardiovascular: Negative for chest pain, palpitations and leg swelling.  Gastrointestinal: Negative for abdominal pain, blood in stool and nausea.  Genitourinary: Negative for dysuria and frequency.  Musculoskeletal: Negative for falls.  Skin: Negative for rash.  Neurological: Negative for dizziness, loss of consciousness and headaches.  Endo/Heme/Allergies: Negative for environmental allergies.  Psychiatric/Behavioral: Negative for depression. The patient is not nervous/anxious.        Objective:    Physical Exam  Constitutional: He is oriented to person, place, and time. He appears well-developed and well-nourished. No distress.  HENT:  Head: Normocephalic and atraumatic.  Nose: Nose normal.  Eyes: Right eye exhibits no discharge. Left eye exhibits no discharge.  Neck: Normal range of motion. Neck supple.  Cardiovascular: Normal rate and regular rhythm.  No murmur heard. Pulmonary/Chest: Effort normal and breath sounds normal.  Abdominal: Soft. Bowel sounds are normal. There is no tenderness.  Musculoskeletal: He exhibits no edema.  Neurological: He is alert and oriented to person, place, and time.  Skin: Skin is warm and dry.  Psychiatric: He has a normal mood and affect.  Nursing note and vitals reviewed.   BP 132/64 (BP Location: Left Arm, Patient Position: Sitting, Cuff Size: Normal)   Pulse 73   Temp 98.5 F (36.9 C) (Oral)   Resp 18   Wt 166 lb 3.2 oz (75.4 kg)   SpO2 97%   BMI 22.54 kg/m  Wt Readings from Last 3 Encounters:  01/21/18 166 lb 3.2 oz (75.4 kg)  08/02/17 171 lb 12.8 oz (77.9 kg)  01/30/17 170 lb 6.4 oz (77.3 kg)     Lab Results  Component Value Date   WBC 4.0  01/15/2018   HGB 14.7 01/15/2018   HCT 42.7 01/15/2018   PLT 153.0 01/15/2018   GLUCOSE 95 01/15/2018   CHOL 138 01/15/2018   TRIG 68.0 01/15/2018   HDL 56.10 01/15/2018   LDLCALC 68 01/15/2018   ALT 12 01/15/2018   AST 17 01/15/2018   NA 137 01/15/2018   K 4.3 01/15/2018   CL 98 01/15/2018   CREATININE 1.16 01/15/2018   BUN 15 01/15/2018   CO2 29 01/15/2018   TSH  1.14 01/15/2018   PSA 0.97 01/15/2018   INR 1.05 07/04/2012   HGBA1C 5.9 01/15/2018    Lab Results  Component Value Date   TSH 1.14 01/15/2018   Lab Results  Component Value Date   WBC 4.0 01/15/2018   HGB 14.7 01/15/2018   HCT 42.7 01/15/2018   MCV 90.6 01/15/2018   PLT 153.0 01/15/2018   Lab Results  Component Value Date   NA 137 01/15/2018   K 4.3 01/15/2018   CO2 29 01/15/2018   GLUCOSE 95 01/15/2018   BUN 15 01/15/2018   CREATININE 1.16 01/15/2018   BILITOT 1.0 01/15/2018   ALKPHOS 56 01/15/2018   AST 17 01/15/2018   ALT 12 01/15/2018   PROT 7.3 01/15/2018   ALBUMIN 5.0 01/15/2018   CALCIUM 10.0 01/15/2018   GFR 66.47 01/15/2018   Lab Results  Component Value Date   CHOL 138 01/15/2018   Lab Results  Component Value Date   HDL 56.10 01/15/2018   Lab Results  Component Value Date   LDLCALC 68 01/15/2018   Lab Results  Component Value Date   TRIG 68.0 01/15/2018   Lab Results  Component Value Date   CHOLHDL 2 01/15/2018   Lab Results  Component Value Date   HGBA1C 5.9 01/15/2018       Assessment & Plan:   Problem List Items Addressed This Visit    Hyperglycemia    hgba1c acceptable, minimize simple carbs. Increase exercise as tolerated.       Relevant Orders   Hemoglobin A1c   Hypertension    Well controlled, no changes to meds. Encouraged heart healthy diet such as the DASH diet and exercise as tolerated.       Relevant Medications   lisinopril (PRINIVIL,ZESTRIL) 10 MG tablet   Other Relevant Orders   CBC   Comprehensive metabolic panel   TSH   History of  oral lesions   Relevant Medications   fluocinonide gel (LIDEX) 0.05 %   Urinary hesitancy    psa ordered       Relevant Orders   PSA   Thrombocytopenia (HCC)    Asymptomatic, stable no changes      Hyperlipidemia    Encouraged heart healthy diet, increase exercise, avoid trans fats, consider a krill oil cap daily      Relevant Medications   lisinopril (PRINIVIL,ZESTRIL) 10 MG tablet   Other Relevant Orders   Lipid panel      I have changed Alvon Imburgia's lisinopril. I am also having him maintain his methadone, multivitamin, naproxen, hepatitis b vaccine, cyclobenzaprine, magic mouthwash, and fluocinonide gel.  Meds ordered this encounter  Medications  . lisinopril (PRINIVIL,ZESTRIL) 10 MG tablet    Sig: Take 1 tablet (10 mg total) by mouth daily.    Dispense:  90 tablet    Refill:  1  . magic mouthwash SOLN    Sig: Take 5 mLs by mouth 2 (two) times daily.    Dispense:  240 mL    Refill:  1  . fluocinonide gel (LIDEX) 0.05 %    Sig: APPLY TO TONGUE TWICE DAILY AS NEEDED    Dispense:  60 g    Refill:  0     Penni Homans, MD

## 2018-02-22 DIAGNOSIS — Z79891 Long term (current) use of opiate analgesic: Secondary | ICD-10-CM | POA: Diagnosis not present

## 2018-03-22 DIAGNOSIS — Z79891 Long term (current) use of opiate analgesic: Secondary | ICD-10-CM | POA: Diagnosis not present

## 2018-04-16 DIAGNOSIS — S83241A Other tear of medial meniscus, current injury, right knee, initial encounter: Secondary | ICD-10-CM | POA: Diagnosis not present

## 2018-04-26 ENCOUNTER — Telehealth: Payer: Self-pay | Admitting: *Deleted

## 2018-04-26 NOTE — Telephone Encounter (Signed)
Received request for Medical Records from [facility]; forwarded to Medical Records via email/scan/SLS 11/15

## 2018-05-03 ENCOUNTER — Telehealth: Payer: Self-pay | Admitting: Family Medicine

## 2018-05-03 NOTE — Telephone Encounter (Signed)
Attempted to call patient to schedule AWV, but patient did not answer. Will try to call patient again at a later time. SF

## 2018-07-24 ENCOUNTER — Other Ambulatory Visit: Payer: Medicare Other

## 2018-07-29 ENCOUNTER — Encounter: Payer: Medicare Other | Admitting: Family Medicine

## 2018-08-12 ENCOUNTER — Other Ambulatory Visit: Payer: Self-pay | Admitting: Family Medicine

## 2018-08-20 ENCOUNTER — Other Ambulatory Visit (INDEPENDENT_AMBULATORY_CARE_PROVIDER_SITE_OTHER): Payer: Medicare Other

## 2018-08-20 DIAGNOSIS — E785 Hyperlipidemia, unspecified: Secondary | ICD-10-CM

## 2018-08-20 DIAGNOSIS — R739 Hyperglycemia, unspecified: Secondary | ICD-10-CM

## 2018-08-20 DIAGNOSIS — I10 Essential (primary) hypertension: Secondary | ICD-10-CM | POA: Diagnosis not present

## 2018-08-20 DIAGNOSIS — R3911 Hesitancy of micturition: Secondary | ICD-10-CM | POA: Diagnosis not present

## 2018-08-20 LAB — CBC
HCT: 44.2 % (ref 39.0–52.0)
Hemoglobin: 14.6 g/dL (ref 13.0–17.0)
MCHC: 33.1 g/dL (ref 30.0–36.0)
MCV: 89.5 fl (ref 78.0–100.0)
Platelets: 143 10*3/uL — ABNORMAL LOW (ref 150.0–400.0)
RBC: 4.94 Mil/uL (ref 4.22–5.81)
RDW: 13.8 % (ref 11.5–15.5)
WBC: 4.6 10*3/uL (ref 4.0–10.5)

## 2018-08-20 LAB — COMPREHENSIVE METABOLIC PANEL
ALBUMIN: 5.1 g/dL (ref 3.5–5.2)
ALT: 15 U/L (ref 0–53)
AST: 23 U/L (ref 0–37)
Alkaline Phosphatase: 69 U/L (ref 39–117)
BILIRUBIN TOTAL: 0.8 mg/dL (ref 0.2–1.2)
BUN: 18 mg/dL (ref 6–23)
CALCIUM: 9.9 mg/dL (ref 8.4–10.5)
CO2: 29 mEq/L (ref 19–32)
CREATININE: 1.09 mg/dL (ref 0.40–1.50)
Chloride: 98 mEq/L (ref 96–112)
GFR: 67.08 mL/min (ref >60.00)
Glucose, Bld: 83 mg/dL (ref 70–99)
Potassium: 3.9 mEq/L (ref 3.5–5.1)
Sodium: 137 mEq/L (ref 135–145)
Total Protein: 7.8 g/dL (ref 6.0–8.3)

## 2018-08-20 LAB — PSA: PSA: 0.85 ng/mL (ref 0.10–4.00)

## 2018-08-20 LAB — TSH: TSH: 1.53 u[IU]/mL (ref 0.35–4.50)

## 2018-08-20 LAB — LIPID PANEL
CHOLESTEROL: 144 mg/dL (ref 0–200)
HDL: 61.5 mg/dL (ref >39.00)
LDL Cholesterol: 70 mg/dL (ref 0–99)
NonHDL: 82.73
TRIGLYCERIDES: 66 mg/dL (ref 0.0–149.0)
Total CHOL/HDL Ratio: 2
VLDL: 13.2 mg/dL (ref 0.0–40.0)

## 2018-08-20 LAB — HEMOGLOBIN A1C: HEMOGLOBIN A1C: 5.8 % (ref 4.6–6.5)

## 2018-08-27 ENCOUNTER — Encounter: Payer: Self-pay | Admitting: Family Medicine

## 2018-08-27 ENCOUNTER — Other Ambulatory Visit: Payer: Self-pay

## 2018-08-27 ENCOUNTER — Ambulatory Visit (INDEPENDENT_AMBULATORY_CARE_PROVIDER_SITE_OTHER): Payer: Medicare Other | Admitting: Family Medicine

## 2018-08-27 DIAGNOSIS — I10 Essential (primary) hypertension: Secondary | ICD-10-CM

## 2018-08-27 DIAGNOSIS — Z0000 Encounter for general adult medical examination without abnormal findings: Secondary | ICD-10-CM

## 2018-08-27 DIAGNOSIS — Z0001 Encounter for general adult medical examination with abnormal findings: Secondary | ICD-10-CM

## 2018-08-27 DIAGNOSIS — M25512 Pain in left shoulder: Secondary | ICD-10-CM

## 2018-08-27 DIAGNOSIS — R739 Hyperglycemia, unspecified: Secondary | ICD-10-CM | POA: Diagnosis not present

## 2018-08-27 DIAGNOSIS — Z8719 Personal history of other diseases of the digestive system: Secondary | ICD-10-CM

## 2018-08-27 DIAGNOSIS — E785 Hyperlipidemia, unspecified: Secondary | ICD-10-CM

## 2018-08-27 MED ORDER — MAGIC MOUTHWASH: 5.0000 mL | Freq: Two times a day (BID) | ORAL | 1 refills | Status: DC

## 2018-08-27 MED ORDER — FLUOCINONIDE 0.05 % EX GEL: CUTANEOUS | 0 refills | Status: DC

## 2018-08-27 NOTE — Assessment & Plan Note (Signed)
Encouraged heart healthy diet, increase exercise, avoid trans fats, consider a krill oil cap daily 

## 2018-08-27 NOTE — Patient Instructions (Addendum)
If shoulder gets worse consider Sports medicine referral  Consider cleaning itchy lesions with Witch Hazel Astringent  Shingrix is the new shingles shot 2 shots over 2-6 months at the pharmacy  pityrisis  cetaphil soap can help Pityriasis Rosea Pityriasis rosea is a rash that usually appears on the chest, abdomen, and back. It may also appear on the upper arms and upper legs. It usually begins as a single patch, and then more patches start to develop. The rash may cause mild itching, but it normally does not cause other problems. It usually goes away without treatment. However, it may take weeks or months for the rash to go away completely. What are the causes? The cause of this condition is not known. The condition does not spread from person to person (is not contagious). What increases the risk? This condition is more likely to develop in:  Persons aged 10-35 years.  Pregnant women. It is more common in the spring and fall seasons. What are the signs or symptoms? The main symptom of this condition is a rash.  The rash usually begins with a single oval patch that is larger than the ones that follow. This is called a herald patch. It generally appears a week or more before the rest of the rash appears.  When more patches start to develop, they spread quickly on the chest, abdomen, back, arms, and legs. These patches are smaller than the first one.  The patches that make up the rash are usually oval-shaped and pink or red in color. They are usually flat but may sometimes be raised so that they can be felt with a finger. They may also be finely crinkled and have a scaly ring around the edge. Some people may have mild itching and nonspecific symptoms, such as:  Nausea.  Loss of appetite.  Difficulty concentrating.  Headache.  Irritability.  Sore throat.  Mild fever. How is this diagnosed? This condition may be diagnosed based on:  Your medical history and a physical exam.   Tests to rule out other causes. This may include blood tests or a test in which a small sample of skin is removed from the rash (biopsy) and checked in a lab. How is this treated?     Treatment is not usually needed for this condition. The rash will often go away on its own in 69-8 weeks. In some cases, a health care provider may recommend or prescribe medicine to reduce itching. Follow these instructions at home:  Take or apply over-the-counter and prescription medicines only as told by your health care provider.  Avoid scratching the affected areas of skin.  Do not take hot baths or use a sauna. Use only warm water when bathing or showering. Heat can increase itching. Adding cornstarch to your bath may help to relieve the itching.  Avoid exposure to the sun and other sources of UV light, such as tanning beds, as told by your health care provider. UV light may help the rash go away but may cause unwanted changes in skin color.  Keep all follow-up visits as told by your health care provider. This is important. Contact a health care provider if:  Your rash does not go away in 8 weeks.  Your rash gets much worse.  You have a fever.  You have swelling or pain in the rash area.  You have fluid, blood, or pus coming from the rash area. Summary  Pityriasis rosea is a rash that usually appears on the trunk  of the body. It can also appear on the upper arms and upper legs.  The rash usually begins with a single oval patch (herald patch) that appears a week or more before the rest of the rash appears. The herald patch is larger than the ones that follow.  The rash may cause mild itching, but it usually does not cause other problems. It usually goes away without treatment in 4-8 weeks.  In some cases, a health care provider may recommend or prescribe medicine to reduce itching. This information is not intended to replace advice given to you by your health care provider. Make sure you discuss  any questions you have with your health care provider. Document Released: 07/05/2001 Document Revised: 05/28/2017 Document Reviewed: 05/28/2017 Elsevier Interactive Patient Education  2019 [place] 69 Years and Older, Male Preventive care refers to lifestyle choices and visits with your health care provider that can promote health and wellness.   What does preventive care include?   A yearly physical exam. This is also called an annual well check.  Dental exams once or twice a year.  Routine eye exams. Ask your health care provider how often you should have your eyes checked.  Personal lifestyle choices, including: ? Daily care of your teeth and gums. ? Regular physical activity. ? Eating a healthy diet. ? Avoiding tobacco and drug use. ? Limiting alcohol use. ? Practicing safe sex. ? Taking low doses of aspirin every day. ? Taking vitamin and mineral supplements as recommended by your health care provider. What happens during an annual well check? The services and screenings done by your health care provider during your annual well check will depend on your age, overall health, lifestyle risk factors, and family history of disease. Counseling Your health care provider may ask you questions about your:  Alcohol use.  Tobacco use.  Drug use.  Emotional well-being.  Home and relationship well-being.  Sexual activity.  Eating habits.  History of falls.  Memory and ability to understand (cognition).  Work and work Statistician. Screening You may have the following tests or measurements:  Height, weight, and BMI.  Blood pressure.  Lipid and cholesterol levels. These may be checked every 5 years, or more frequently if you are over 69 years old.  Skin check.  Lung cancer screening. You may have this screening every year starting at age 69 if you have a 30-pack-year history of smoking and currently smoke or have quit within the past 15  years.  Colorectal cancer screening. All adults should have this screening starting at age 69 and continuing until age 69. You will have tests every 1-10 years, depending on your results and the type of screening test. People at increased risk should start screening at an earlier age. Screening tests may include: ? Guaiac-based fecal occult blood testing. ? Fecal immunochemical test (FIT). ? Stool DNA test. ? Virtual colonoscopy. ? Sigmoidoscopy. During this test, a flexible tube with a tiny camera (sigmoidoscope) is used to examine your rectum and lower colon. The sigmoidoscope is inserted through your anus into your rectum and lower colon. ? Colonoscopy. During this test, a long, thin, flexible tube with a tiny camera (colonoscope) is used to examine your entire colon and rectum.  Prostate cancer screening. Recommendations will vary depending on your family history and other risks.  Hepatitis C blood test.  Hepatitis B blood test.  Sexually transmitted disease (STD) testing.  Diabetes screening. This is done by checking your blood  sugar (glucose) after you have not eaten for a while (fasting). You may have this done every 1-3 years.  Abdominal aortic aneurysm (AAA) screening. You may need this if you are a current or former smoker.  Osteoporosis. You may be screened starting at age 58 if you are at high risk. Talk with your health care provider about your test results, treatment options, and if necessary, the need for more tests. Vaccines Your health care provider may recommend certain vaccines, such as:  Influenza vaccine. This is recommended every year.  Tetanus, diphtheria, and acellular pertussis (Tdap, Td) vaccine. You may need a Td booster every 10 years.  Varicella vaccine. You may need this if you have not been vaccinated.  Zoster vaccine. You may need this after age 40.  Measles, mumps, and rubella (MMR) vaccine. You may need at least one dose of MMR if you were born in  1958 or later. You may also need a second dose.  Pneumococcal 13-valent conjugate (PCV13) vaccine. One dose is recommended after age 78.  Pneumococcal polysaccharide (PPSV23) vaccine. One dose is recommended after age 16.  Meningococcal vaccine. You may need this if you have certain conditions.  Hepatitis A vaccine. You may need this if you have certain conditions or if you travel or work in places where you may be exposed to hepatitis A.  Hepatitis B vaccine. You may need this if you have certain conditions or if you travel or work in places where you may be exposed to hepatitis B.  Haemophilus influenzae type b (Hib) vaccine. You may need this if you have certain risk factors. Talk to your health care provider about which screenings and vaccines you need and how often you need them. This information is not intended to replace advice given to you by your health care provider. Make sure you discuss any questions you have with your health care provider. Document Released: 06/25/2015 Document Revised: 07/19/2017 Document Reviewed: 03/30/2015 Elsevier Interactive Patient Education  2019 [employer].

## 2018-08-27 NOTE — Assessment & Plan Note (Signed)
hgba1c acceptable, minimize simple carbs. Increase exercise as tolerated. Continue current meds 

## 2018-08-27 NOTE — Assessment & Plan Note (Signed)
Well controlled, no changes to meds. Encouraged heart healthy diet such as the DASH diet and exercise as tolerated.  °

## 2018-08-28 NOTE — Assessment & Plan Note (Signed)
Is improving some and declines referral at this time. He was seen by ortho Dr Noemi Chapel and given a steroid shot which helped for about 2 weeks then pain worsened for some time. Is now improving and declines further referral at this time. Encouraged moist heat and gentle stretching as tolerated. May try NSAIDs and prescription meds as directed and report if symptoms worsen or seek immediate care. Try topical lidocaine

## 2018-08-28 NOTE — Assessment & Plan Note (Signed)
Less frequent since finishing treatments for Hep C but still happens occasionally. Given refill on meds.

## 2018-08-28 NOTE — Assessment & Plan Note (Signed)
Patient encouraged to maintain heart healthy diet, regular exercise, adequate sleep. Consider daily probiotics. Take medications as prescribed 

## 2018-08-28 NOTE — Progress Notes (Signed)
Subjective:    Patient ID: Shaun [name], male    DOB: Jan 12, 1951, 68 y.o.   MRN: [id number]  Chief Complaint  Patient presents with  . Annual Exam  . Shoulder Pain    right shoulder, x3 months, pain comes and goes.    HPI Patient is in today for annual preventative exam and follow up on chronic concerns including oral lesions, hypertension, hyperglycemia and hyperlipidemia and more. No recent febrile illness or hospitalizations. He is doing well with his activities of daily living. He maintain a heart healthy diet and regular exercise. No polyuria or polydipsia. His only complaint is left shoulder pain which is improving now but has been severe enough that he saw ortho and they gave him a steroid injection which helped but after two weeks pain came back worse. Now manageable. No major trauma or fall. Denies CP/palp/SOB/HA/congestion/fevers/GI or GU c/o. Taking meds as prescribed  Past Medical History:  Diagnosis Date  . Allergic state 01/30/2017  . Arm mass   . Arm skin lesion, left 11/12/2012   And sun damaged skin  . Elevated BP 11/14/2012  . Encounter for Medicare annual wellness exam 01/27/2016  . GERD (gastroesophageal reflux disease) 01/27/2016  . Hepatitis C, chronic ([place])   . History of intravenous drug use in remission   . History of oral lesions 05/14/2013  . History of oral lesions 07/29/2015  . Hypertension 11/14/2012  . Left shoulder pain 12/16/2012  . 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 Myalgia and myositis 08/13/2014  . Preventative health care 05/14/2013  . Preventative health care 05/14/2013    Past Surgical History:  Procedure Laterality Date  . KNEE ARTHROSCOPY  1996   ligment repair    Family History  Problem Relation Age of Onset  . Heart disease Father   . Diabetes Father   . Hyperlipidemia Father   . Hypertension Father   . Thyroid disease Sister   . Heart disease Cousin        MI  . Diabetes Cousin   . Obesity Cousin   . Osteoporosis Mother   . Diabetes  Paternal Grandfather   . Thyroid disease Maternal Grandmother   . Cancer Neg Hx        negative for colon and prostate    Social History   Socioeconomic History  . Marital status: Married    Spouse name: Not on file  . Number of children: Not on file  . Years of education: Not on file  . Highest education level: Not on file  Occupational History  . Not on file  Social Needs  . Financial resource strain: Not on file  . Food insecurity:    Worry: Not on file    Inability: Not on file  . Transportation needs:    Medical: Not on file    Non-medical: Not on file  Tobacco Use  . Smoking status: Never Smoker  . Smokeless tobacco: Never Used  Substance and Sexual Activity  . Alcohol use: No    Alcohol/week: 0.0 standard drinks  . Drug use: No  . Sexual activity: Yes    Comment: avoids nuts, fruits, lives with wife, makes picture frames, building maintenance  Lifestyle  . Physical activity:    Days per week: Not on file    Minutes per session: Not on file  . Stress: Not on file  Relationships  . Social connections:    Talks on phone: Not on file    Gets together: Not on file  Attends religious service: Not on file    Active member of club or organization: Not on file    Attends meetings of clubs or organizations: Not on file    Relationship status: Not on file  . Intimate partner violence:    Fear of current or ex partner: Not on file    Emotionally abused: Not on file    Physically abused: Not on file    Forced sexual activity: Not on file  Other Topics Concern  . Not on file  Social History Narrative  . Not on file    Outpatient Medications Prior to Visit  Medication Sig Dispense Refill  . cyclobenzaprine (FLEXERIL) 10 MG tablet Take ONE (1) tablet by mouth twice daily as needed for MUSCLE SPASMS 30 tablet 1  . lisinopril (PRINIVIL,ZESTRIL) 10 MG tablet TAKE 1 TABLET BY MOUTH ONCE DAILY 90 tablet 1  . methadone (DOLOPHINE) 10 MG/5ML solution Take 8 mg by mouth  every morning.     . Multiple Vitamin (MULTIVITAMIN) tablet Take 1 tablet by mouth every morning.     . naproxen (NAPROSYN) 500 MG tablet Take 1 tablet (500 mg total) by mouth 2 (two) times daily with a meal. 60 tablet 1  . fluocinonide gel (LIDEX) 0.05 % APPLY TO TONGUE TWICE DAILY AS NEEDED 60 g 0  . hepatitis b vaccine for neonates (ENGERIX-B) 10 MCG/0.5ML injection Inject 0.5 mLs into the muscle once.    . magic mouthwash SOLN Take 5 mLs by mouth 2 (two) times daily. 240 mL 1   No facility-administered medications prior to visit.     No Known Allergies  Review of Systems  Constitutional: Negative for fever and malaise/fatigue.  HENT: Negative for congestion.   Eyes: Negative for blurred vision.  Respiratory: Negative for shortness of breath.   Cardiovascular: Negative for chest pain, palpitations and leg swelling.  Gastrointestinal: Negative for abdominal pain, blood in stool and nausea.  Genitourinary: Negative for dysuria and frequency.  Musculoskeletal: Positive for joint pain. Negative for falls.  Skin: Negative for rash.  Neurological: Negative for dizziness, loss of consciousness and headaches.  Endo/Heme/Allergies: Negative for environmental allergies.  Psychiatric/Behavioral: Negative for depression. The patient is not nervous/anxious.        Objective:    Physical Exam Vitals signs and nursing note reviewed.  Constitutional:      General: He is not in acute distress.    Appearance: He is well-developed.  HENT:     Head: Normocephalic and atraumatic.     Nose: Nose normal.  Eyes:     General:        Right eye: No discharge.        Left eye: No discharge.  Neck:     Musculoskeletal: Normal range of motion and neck supple.  Cardiovascular:     Rate and Rhythm: Normal rate and regular rhythm.     Heart sounds: No murmur.  Pulmonary:     Effort: Pulmonary effort is normal.     Breath sounds: Normal breath sounds.  Abdominal:     General: Bowel sounds are  normal.     Palpations: Abdomen is soft.     Tenderness: There is no abdominal tenderness.  Skin:    General: Skin is warm and dry.  Neurological:     Mental Status: He is alert and oriented to person, place, and time.     BP 134/72 (BP Location: Left Arm, Patient Position: Sitting, Cuff Size: Normal)   Pulse 69  Temp 98.3 F (36.8 C) (Oral)   Resp 18   Ht 6' (1.829 m)   Wt 166 lb 12.8 oz (75.7 kg)   SpO2 98%   BMI 22.62 kg/m  Wt Readings from Last 3 Encounters:  08/27/18 166 lb 12.8 oz (75.7 kg)  01/21/18 166 lb 3.2 oz (75.4 kg)  08/02/17 171 lb 12.8 oz (77.9 kg)     Lab Results  Component Value Date   WBC 4.6 08/20/2018   HGB 14.6 08/20/2018   HCT 44.2 08/20/2018   PLT 143.0 (L) 08/20/2018   GLUCOSE 83 08/20/2018   CHOL 144 08/20/2018   TRIG 66.0 08/20/2018   HDL 61.50 08/20/2018   LDLCALC 70 08/20/2018   ALT 15 08/20/2018   AST 23 08/20/2018   NA 137 08/20/2018   K 3.9 08/20/2018   CL 98 08/20/2018   CREATININE 1.09 08/20/2018   BUN 18 08/20/2018   CO2 29 08/20/2018   TSH 1.53 08/20/2018   PSA 0.85 08/20/2018   INR 1.05 07/04/2012   HGBA1C 5.8 08/20/2018    Lab Results  Component Value Date   TSH 1.53 08/20/2018   Lab Results  Component Value Date   WBC 4.6 08/20/2018   HGB 14.6 08/20/2018   HCT 44.2 08/20/2018   MCV 89.5 08/20/2018   PLT 143.0 (L) 08/20/2018   Lab Results  Component Value Date   NA 137 08/20/2018   K 3.9 08/20/2018   CO2 29 08/20/2018   GLUCOSE 83 08/20/2018   BUN 18 08/20/2018   CREATININE 1.09 08/20/2018   BILITOT 0.8 08/20/2018   ALKPHOS 69 08/20/2018   AST 23 08/20/2018   ALT 15 08/20/2018   PROT 7.8 08/20/2018   ALBUMIN 5.1 08/20/2018   CALCIUM 9.9 08/20/2018   GFR 67.08 08/20/2018   Lab Results  Component Value Date   CHOL 144 08/20/2018   Lab Results  Component Value Date   HDL 61.50 08/20/2018   Lab Results  Component Value Date   LDLCALC 70 08/20/2018   Lab Results  Component Value Date    TRIG 66.0 08/20/2018   Lab Results  Component Value Date   CHOLHDL 2 08/20/2018   Lab Results  Component Value Date   HGBA1C 5.8 08/20/2018       Assessment & Plan:   Problem List Items Addressed This Visit    Hyperglycemia    hgba1c acceptable, minimize simple carbs. Increase exercise as tolerated. Continue current meds      Relevant Orders   Hemoglobin A1c   Hypertension    Well controlled, no changes to meds. Encouraged heart healthy diet such as the DASH diet and exercise as tolerated.       Relevant Orders   CBC   Comprehensive metabolic panel   TSH   Left shoulder pain    Is improving some and declines referral at this time. He was seen by ortho Dr Noemi Chapel and given a steroid shot which helped for about 2 weeks then pain worsened for some time. Is now improving and declines further referral at this time. Encouraged moist heat and gentle stretching as tolerated. May try NSAIDs and prescription meds as directed and report if symptoms worsen or seek immediate care. Try topical lidocaine      Preventative health care    Patient encouraged to maintain heart healthy diet, regular exercise, adequate sleep. Consider daily probiotics. Take medications as prescribed      History of oral lesions    Less frequent since  finishing treatments for Hep C but still happens occasionally. Given refill on meds.       Relevant Medications   fluocinonide gel (LIDEX) 0.05 %   Hyperlipidemia    Encouraged heart healthy diet, increase exercise, avoid trans fats, consider a krill oil cap daily      Relevant Orders   Lipid panel      I have discontinued Shaun [name]'s hepatitis b vaccine. I am also having him maintain his methadone, multivitamin, naproxen, cyclobenzaprine, lisinopril, fluocinonide gel, and magic mouthwash.  Meds ordered this encounter  Medications  . fluocinonide gel (LIDEX) 0.05 %    Sig: APPLY TO TONGUE TWICE DAILY AS NEEDED    Dispense:  60 g    Refill:  0   . magic mouthwash SOLN    Sig: Take 5 mLs by mouth 2 (two) times daily.    Dispense:  240 mL    Refill:  1     Penni Homans, MD

## 2019-02-12 ENCOUNTER — Other Ambulatory Visit: Payer: Self-pay | Admitting: Family Medicine

## 2019-02-12 MED ORDER — LISINOPRIL 10 MG PO TABS: 10.0000 mg | ORAL_TABLET | Freq: Every day | ORAL | 0 refills | Status: DC

## 2019-02-12 NOTE — Telephone Encounter (Signed)
Copied from [place] [phone] [phone]. Topic: Quick Communication - Rx Refill/Question >> Feb 12, 2019  3:27 PM Rainey Pines A wrote: Medication:lisinopril (PRINIVIL,ZESTRIL) 10 MG tablet  Has the patient contacted their pharmacy?Yes (Agent: If no, request that the patient contact the pharmacy for the refill.) (Agent: If yes, when and what did the pharmacy advise?)Contact PCP  Preferred Pharmacy (with phone number or street name): [facility], [place] - [address] [phone] (Phone) [phone] (Fax)    Agent: Please be advised that RX refills may take up to 3 business days. We ask that you follow-up with your pharmacy.

## 2019-02-12 NOTE — Telephone Encounter (Signed)
Approved per protocol.  Requested Prescriptions  Pending Prescriptions Disp Refills  . lisinopril (ZESTRIL) 10 MG tablet 90 tablet 0    Sig: Take 1 tablet (10 mg total) by mouth daily.     Cardiovascular:  ACE Inhibitors Passed - 02/12/2019  3:37 PM      Passed - Cr in normal range and within 180 days    Creat  Date Value Ref Range Status  01/13/2014 1.20 0.50 - 1.35 mg/dL Final   Creatinine, Ser  Date Value Ref Range Status  08/20/2018 1.09 0.40 - 1.50 mg/dL Final         Passed - K in normal range and within 180 days    Potassium  Date Value Ref Range Status  08/20/2018 3.9 3.5 - 5.1 mEq/L Final         Passed - Patient is not pregnant      Passed - Last BP in normal range    BP Readings from Last 1 Encounters:  08/27/18 134/72         Passed - Valid encounter within last 6 months    Recent Outpatient Visits          5 months ago History of oral lesions   [facility], MD   1 year ago History of oral lesions   [facility], MD   1 year ago Essential hypertension   [facility], MD   1 year ago Essential hypertension   [facility], MD   1 year ago Needs flu shot   [facility], MD      Future Appointments            In 2 weeks [name], MD [facility]

## 2019-02-27 ENCOUNTER — Other Ambulatory Visit: Payer: Self-pay

## 2019-02-27 ENCOUNTER — Other Ambulatory Visit (INDEPENDENT_AMBULATORY_CARE_PROVIDER_SITE_OTHER): Payer: Medicare Other

## 2019-02-27 DIAGNOSIS — I10 Essential (primary) hypertension: Secondary | ICD-10-CM

## 2019-02-27 DIAGNOSIS — E785 Hyperlipidemia, unspecified: Secondary | ICD-10-CM | POA: Diagnosis not present

## 2019-02-27 DIAGNOSIS — R739 Hyperglycemia, unspecified: Secondary | ICD-10-CM

## 2019-02-27 LAB — CBC
HCT: 43.7 % (ref 39.0–52.0)
Hemoglobin: 14.5 g/dL (ref 13.0–17.0)
MCHC: 33.2 g/dL (ref 30.0–36.0)
MCV: 91.9 fl (ref 78.0–100.0)
Platelets: 152 10*3/uL (ref 150.0–400.0)
RBC: 4.76 Mil/uL (ref 4.22–5.81)
RDW: 12.7 % (ref 11.5–15.5)
WBC: 3.9 10*3/uL — ABNORMAL LOW (ref 4.0–10.5)

## 2019-02-27 LAB — LIPID PANEL
Cholesterol: 134 mg/dL (ref 0–200)
HDL: 60.5 mg/dL (ref >39.00)
LDL Cholesterol: 63 mg/dL (ref 0–99)
NonHDL: 73.37
Total CHOL/HDL Ratio: 2
Triglycerides: 54 mg/dL (ref 0.0–149.0)
VLDL: 10.8 mg/dL (ref 0.0–40.0)

## 2019-02-27 LAB — COMPREHENSIVE METABOLIC PANEL
ALT: 13 U/L (ref 0–53)
AST: 20 U/L (ref 0–37)
Albumin: 4.9 g/dL (ref 3.5–5.2)
Alkaline Phosphatase: 58 U/L (ref 39–117)
BUN: 14 mg/dL (ref 6–23)
CO2: 28 mEq/L (ref 19–32)
Calcium: 10 mg/dL (ref 8.4–10.5)
Chloride: 97 mEq/L (ref 96–112)
Creatinine, Ser: 1.15 mg/dL (ref 0.40–1.50)
GFR: 62.96 mL/min (ref >60.00)
Glucose, Bld: 81 mg/dL (ref 70–99)
Potassium: 4.1 mEq/L (ref 3.5–5.1)
Sodium: 136 mEq/L (ref 135–145)
Total Bilirubin: 1 mg/dL (ref 0.2–1.2)
Total Protein: 7.3 g/dL (ref 6.0–8.3)

## 2019-02-27 LAB — TSH: TSH: 0.96 u[IU]/mL (ref 0.35–4.50)

## 2019-02-27 LAB — HEMOGLOBIN A1C: Hgb A1c MFr Bld: 5.9 % (ref 4.6–6.5)

## 2019-03-04 ENCOUNTER — Other Ambulatory Visit: Payer: Self-pay

## 2019-03-04 ENCOUNTER — Ambulatory Visit (INDEPENDENT_AMBULATORY_CARE_PROVIDER_SITE_OTHER): Payer: Medicare Other | Admitting: Family Medicine

## 2019-03-04 DIAGNOSIS — R739 Hyperglycemia, unspecified: Secondary | ICD-10-CM | POA: Diagnosis not present

## 2019-03-04 DIAGNOSIS — M545 Low back pain, unspecified: Secondary | ICD-10-CM

## 2019-03-04 DIAGNOSIS — I10 Essential (primary) hypertension: Secondary | ICD-10-CM | POA: Diagnosis not present

## 2019-03-04 DIAGNOSIS — E785 Hyperlipidemia, unspecified: Secondary | ICD-10-CM | POA: Diagnosis not present

## 2019-03-04 MED ORDER — MAGIC MOUTHWASH: 5.0000 mL | Freq: Two times a day (BID) | ORAL | 1 refills | Status: DC

## 2019-03-04 MED ORDER — LISINOPRIL 10 MG PO TABS: 10.0000 mg | ORAL_TABLET | Freq: Every day | ORAL | 1 refills | Status: DC

## 2019-03-05 NOTE — Assessment & Plan Note (Signed)
Encouraged heart healthy diet, increase exercise, avoid trans fats, consider a krill oil cap daily 

## 2019-03-05 NOTE — Progress Notes (Signed)
Virtual Visit via phone Note  I connected with Shaun [name] on 03/04/19 at  2:00 PM EDT by a phone enabled telemedicine application and verified that I am speaking with the correct person using two identifiers.  Location: Patient: home Provider: office   I discussed the limitations of evaluation and management by telemedicine and the availability of in person appointments. The patient expressed understanding and agreed to proceed. Magdalene Molly, CMA was able to get patient set up on phone visit after being unable to establish a video visit.    Subjective:    Patient ID: Shaun [name], male    DOB: 10-Oct-1950, 68 y.o.   MRN: [id number]  No chief complaint on file.   HPI Patient is in today for follow up on chronic medical concerns including hypertension, hyperlipidemia, hyperglycemia and more. No recent febrile illness or hospitalizations. No c/o polyuria or polydipsia. Continues to struglgle with joint and back pain but follows with pain management. Denies CP/palp/SOB/HA/congestion/fevers/GI or GU c/o. Taking meds as prescribed  Past Medical History:  Diagnosis Date  . Allergic state 01/30/2017  . Arm mass   . Arm skin lesion, left 11/12/2012   And sun damaged skin  . Elevated BP 11/14/2012  . Encounter for Medicare annual wellness exam 01/27/2016  . GERD (gastroesophageal reflux disease) 01/27/2016  . Hepatitis C, chronic ([place])   . History of intravenous drug use in remission   . History of oral lesions 05/14/2013  . History of oral lesions 07/29/2015  . Hypertension 11/14/2012  . Left shoulder pain 12/16/2012  . Methadone use ([place]) 01/31/2015  . Myalgia and myositis 08/13/2014  . Preventative health care 05/14/2013  . Preventative health care 05/14/2013    Past Surgical History:  Procedure Laterality Date  . KNEE ARTHROSCOPY  1996   ligment repair    Family History  Problem Relation Age of Onset  . Heart disease Father   . Diabetes Father   . Hyperlipidemia Father   .  Hypertension Father   . Thyroid disease Sister   . Heart disease Cousin        MI  . Diabetes Cousin   . Obesity Cousin   . Osteoporosis Mother   . Diabetes Paternal Grandfather   . Thyroid disease Maternal Grandmother   . Cancer Neg Hx        negative for colon and prostate    Social History   Socioeconomic History  . Marital status: Married    Spouse name: Not on file  . Number of children: Not on file  . Years of education: Not on file  . Highest education level: Not on file  Occupational History  . Not on file  Social Needs  . Financial resource strain: Not on file  . Food insecurity    Worry: Not on file    Inability: Not on file  . Transportation needs    Medical: Not on file    Non-medical: Not on file  Tobacco Use  . Smoking status: Never Smoker  . Smokeless tobacco: Never Used  Substance and Sexual Activity  . Alcohol use: No    Alcohol/week: 0.0 standard drinks  . Drug use: No  . Sexual activity: Yes    Comment: avoids nuts, fruits, lives with wife, makes picture frames, building maintenance  Lifestyle  . Physical activity    Days per week: Not on file    Minutes per session: Not on file  . Stress: Not on file  Relationships  . Social connections  Talks on phone: Not on file    Gets together: Not on file    Attends religious service: Not on file    Active member of club or organization: Not on file    Attends meetings of clubs or organizations: Not on file    Relationship status: Not on file  . Intimate partner violence    Fear of current or ex partner: Not on file    Emotionally abused: Not on file    Physically abused: Not on file    Forced sexual activity: Not on file  Other Topics Concern  . Not on file  Social History Narrative  . Not on file    Outpatient Medications Prior to Visit  Medication Sig Dispense Refill  . cyclobenzaprine (FLEXERIL) 10 MG tablet Take ONE (1) tablet by mouth twice daily as needed for MUSCLE SPASMS 30 tablet  1  . fluocinonide gel (LIDEX) 0.05 % APPLY TO TONGUE TWICE DAILY AS NEEDED 60 g 0  . methadone (DOLOPHINE) 10 MG/5ML solution Take 8 mg by mouth every morning.     . Multiple Vitamin (MULTIVITAMIN) tablet Take 1 tablet by mouth every morning.     . naproxen (NAPROSYN) 500 MG tablet Take 1 tablet (500 mg total) by mouth 2 (two) times daily with a meal. 60 tablet 1  . lisinopril (ZESTRIL) 10 MG tablet Take 1 tablet (10 mg total) by mouth daily. 90 tablet 0  . magic mouthwash SOLN Take 5 mLs by mouth 2 (two) times daily. 240 mL 1   No facility-administered medications prior to visit.     No Known Allergies  Review of Systems  Constitutional: Negative for fever and malaise/fatigue.  HENT: Negative for congestion.   Eyes: Negative for blurred vision.  Respiratory: Negative for shortness of breath.   Cardiovascular: Negative for chest pain, palpitations and leg swelling.  Gastrointestinal: Negative for abdominal pain, blood in stool and nausea.  Genitourinary: Negative for dysuria and frequency.  Musculoskeletal: Positive for back pain and joint pain. Negative for falls.  Skin: Negative for rash.  Neurological: Negative for dizziness, loss of consciousness and headaches.  Endo/Heme/Allergies: Negative for environmental allergies.  Psychiatric/Behavioral: Negative for depression. The patient is not nervous/anxious.        Objective:    Physical Exam unable to obtain via phone  BP 116/72  Wt Readings from Last 3 Encounters:  08/27/18 166 lb 12.8 oz (75.7 kg)  01/21/18 166 lb 3.2 oz (75.4 kg)  08/02/17 171 lb 12.8 oz (77.9 kg)    Diabetic Foot Exam - Simple   No data filed     Lab Results  Component Value Date   WBC 3.9 (L) 02/27/2019   HGB 14.5 02/27/2019   HCT 43.7 02/27/2019   PLT 152.0 02/27/2019   GLUCOSE 81 02/27/2019   CHOL 134 02/27/2019   TRIG 54.0 02/27/2019   HDL 60.50 02/27/2019   LDLCALC 63 02/27/2019   ALT 13 02/27/2019   AST 20 02/27/2019   NA 136  02/27/2019   K 4.1 02/27/2019   CL 97 02/27/2019   CREATININE 1.15 02/27/2019   BUN 14 02/27/2019   CO2 28 02/27/2019   TSH 0.96 02/27/2019   PSA 0.85 08/20/2018   INR 1.05 07/04/2012   HGBA1C 5.9 02/27/2019    Lab Results  Component Value Date   TSH 0.96 02/27/2019   Lab Results  Component Value Date   WBC 3.9 (L) 02/27/2019   HGB 14.5 02/27/2019   HCT 43.7 02/27/2019  MCV 91.9 02/27/2019   PLT 152.0 02/27/2019   Lab Results  Component Value Date   NA 136 02/27/2019   K 4.1 02/27/2019   CO2 28 02/27/2019   GLUCOSE 81 02/27/2019   BUN 14 02/27/2019   CREATININE 1.15 02/27/2019   BILITOT 1.0 02/27/2019   ALKPHOS 58 02/27/2019   AST 20 02/27/2019   ALT 13 02/27/2019   PROT 7.3 02/27/2019   ALBUMIN 4.9 02/27/2019   CALCIUM 10.0 02/27/2019   GFR 62.96 02/27/2019   Lab Results  Component Value Date   CHOL 134 02/27/2019   Lab Results  Component Value Date   HDL 60.50 02/27/2019   Lab Results  Component Value Date   LDLCALC 63 02/27/2019   Lab Results  Component Value Date   TRIG 54.0 02/27/2019   Lab Results  Component Value Date   CHOLHDL 2 02/27/2019   Lab Results  Component Value Date   HGBA1C 5.9 02/27/2019       Assessment & Plan:   Problem List Items Addressed This Visit    Low back pain    Continues to follow with pain management. He is doing well with ADLs      Hyperglycemia    hgba1c acceptable, minimize simple carbs. Increase exercise as tolerated.       Hypertension    Monitor vitals weekly, no changes to meds. Encouraged heart healthy diet such as the DASH diet and exercise as tolerated.       Relevant Medications   lisinopril (ZESTRIL) 10 MG tablet   Hyperlipidemia    Encouraged heart healthy diet, increase exercise, avoid trans fats, consider a krill oil cap daily      Relevant Medications   lisinopril (ZESTRIL) 10 MG tablet      I am having Ivor Reining maintain his methadone, multivitamin, naproxen,  cyclobenzaprine, fluocinonide gel, lisinopril, and magic mouthwash.  Meds ordered this encounter  Medications  . lisinopril (ZESTRIL) 10 MG tablet    Sig: Take 1 tablet (10 mg total) by mouth daily.    Dispense:  90 tablet    Refill:  1  . magic mouthwash SOLN    Sig: Take 5 mLs by mouth 2 (two) times daily.    Dispense:  240 mL    Refill:  1   I discussed the assessment and treatment plan with the patient. The patient was provided an opportunity to ask questions and all were answered. The patient agreed with the plan and demonstrated an understanding of the instructions.   The patient was advised to call back or seek an in-person evaluation if the symptoms worsen or if the condition fails to improve as anticipated.  I provided 25 minutes of non-face-to-face time during this encounter.   Penni Homans, MD

## 2019-03-05 NOTE — Assessment & Plan Note (Signed)
hgba1c acceptable, minimize simple carbs. Increase exercise as tolerated.  

## 2019-03-05 NOTE — Assessment & Plan Note (Signed)
Continues to follow with pain management. He is doing well with ADLs

## 2019-03-05 NOTE — Assessment & Plan Note (Signed)
Monitor vitals weekly, no changes to meds. Encouraged heart healthy diet such as the DASH diet and exercise as tolerated.

## 2019-04-28 ENCOUNTER — Telehealth: Payer: Self-pay | Admitting: Family Medicine

## 2019-04-28 ENCOUNTER — Other Ambulatory Visit: Payer: Self-pay | Admitting: Family Medicine

## 2019-04-28 MED ORDER — CYCLOBENZAPRINE HCL 10 MG PO TABS: 5.0000 mg | ORAL_TABLET | Freq: Two times a day (BID) | ORAL | 0 refills | Status: DC | PRN

## 2019-04-28 NOTE — Telephone Encounter (Addendum)
cyclobenzaprine (FLEXERIL) 10 MG tablet  Pt has not had this since 12/18 but hurt back picking up concrete elephant this weekend, does not have a phone to do virtual but really wanted this Flexeril call in if possible cb FU pt at [phone]

## 2019-04-28 NOTE — Telephone Encounter (Signed)
I have sent in #20 of Flexeril 10 mg to take 1/2 to 1 tab bid prn but if no improvement we can do a phone visit to discuss symptoms and choose a different course of action.

## 2019-04-28 NOTE — Telephone Encounter (Signed)
Please advise 

## 2019-04-29 NOTE — Telephone Encounter (Signed)
Spoke with patient and let him know about the medication and also the visit, he stated if it does not work he will call and set up appointment

## 2019-07-23 ENCOUNTER — Telehealth: Payer: Self-pay | Admitting: Family Medicine

## 2019-07-23 DIAGNOSIS — E785 Hyperlipidemia, unspecified: Secondary | ICD-10-CM

## 2019-07-23 DIAGNOSIS — R739 Hyperglycemia, unspecified: Secondary | ICD-10-CM

## 2019-07-23 DIAGNOSIS — I10 Essential (primary) hypertension: Secondary | ICD-10-CM

## 2019-07-23 DIAGNOSIS — Z125 Encounter for screening for malignant neoplasm of prostate: Secondary | ICD-10-CM

## 2019-07-23 NOTE — Telephone Encounter (Signed)
Please set patient up for labs priro to cpe to include cmp, cbc, tsh, lipid, PSA, hgba1c

## 2019-07-23 NOTE — Telephone Encounter (Signed)
Pt is scheduled for cpe 4/15 and would like labs prior. Please advise if orders in/ok to schedule.

## 2019-07-23 NOTE — Telephone Encounter (Signed)
There are no future labs//let us know what to order.

## 2019-07-28 NOTE — Telephone Encounter (Signed)
Created future orders for labs/lab visit needs to be scheduled ONLY/thx dmf

## 2019-07-29 NOTE — Telephone Encounter (Signed)
Asked Shaun [name] to plz help schedule this as I have created future orders/thx dmf

## 2019-09-22 NOTE — Progress Notes (Signed)
Nurse connected with patient 09/23/19 at  8:00 AM EDT by a telephone enabled telemedicine application and verified that I am speaking with the correct person using two identifiers. Patient stated full name and DOB. Patient gave permission to continue with virtual visit. Patient's location was at home and Nurse's location was at [place] office.   Subjective:   Korey Lenoir is a 69 y.o. male who presents for Medicare Annual/Subsequent preventive examination.  Still works full time making picture frames. Also enjoys gardening.  Review of Systems:  Home Safety/Smoke Alarms: Feels safe in home. Smoke alarms in place.  Lives w/wife and  w/ his husky Lorenza Burton).   Male:   CCS-  08/12/10. Recall 10 yrs per pt.   PSA-  Lab Results  Component Value Date   PSA 0.85 08/20/2018   PSA 0.97 01/15/2018   PSA 0.75 01/30/2017       Objective:    Vitals: Unable to assess. This visit is enabled though telemedicine due to Covid 19.   Advanced Directives 09/23/2019 01/27/2016 07/29/2015 07/04/2012  Does Patient Have a Medical Advance Directive? Yes - Yes Patient does not have advance directive  Type of Advance Directive [place];Living will [place];Living will [place];Living will -  Does patient want to make changes to medical advance directive? No - Patient declined No - Patient declined - -  Copy of [place] in Chart? No - copy requested No - copy requested No - copy requested -  Pre-existing out of facility DNR order (yellow form or pink MOST form) - - - No    Tobacco Social History   Tobacco Use  Smoking Status Never Smoker  Smokeless Tobacco Never Used     Counseling given: Not Answered   Clinical Intake: Pain : No/denies pain     Past Medical History:  Diagnosis Date  . Allergic state 01/30/2017  . Arm mass   . Arm skin lesion, left 11/12/2012   And sun damaged skin  . Elevated BP 11/14/2012  .  Encounter for Medicare annual wellness exam 01/27/2016  . GERD (gastroesophageal reflux disease) 01/27/2016  . Hepatitis C, chronic ([place])   . History of intravenous drug use in remission   . History of oral lesions 05/14/2013  . History of oral lesions 07/29/2015  . Hypertension 11/14/2012  . Left shoulder pain 12/16/2012  . 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 Myalgia and myositis 08/13/2014  . Preventative health care 05/14/2013  . Preventative health care 05/14/2013   Past Surgical History:  Procedure Laterality Date  . KNEE ARTHROSCOPY  1996   ligment repair   Family History  Problem Relation Age of Onset  . Heart disease Father   . Diabetes Father   . Hyperlipidemia Father   . Hypertension Father   . Thyroid disease Sister   . Heart disease Cousin        MI  . Diabetes Cousin   . Obesity Cousin   . Osteoporosis Mother   . Diabetes Paternal Grandfather   . Thyroid disease Maternal Grandmother   . Cancer Neg Hx        negative for colon and prostate   Social History   Socioeconomic History  . Marital status: Married    Spouse name: Not on file  . Number of children: Not on file  . Years of education: Not on file  . Highest education level: Not on file  Occupational History  . Not on  file  Tobacco Use  . Smoking status: Never Smoker  . Smokeless tobacco: Never Used  Substance and Sexual Activity  . Alcohol use: No    Alcohol/week: 0.0 standard drinks  . Drug use: No  . Sexual activity: Yes    Comment: avoids nuts, fruits, lives with wife, makes picture frames, building maintenance  Other Topics Concern  . Not on file  Social History Narrative  . Not on file   Social Determinants of Health   Financial Resource Strain: Low Risk   . Difficulty of Paying Living Expenses: Not hard at all  Food Insecurity: No Food Insecurity  . Worried About Charity fundraiser in the Last Year: Never true  . Ran Out of Food in the Last Year: Never true  Transportation Needs: No  Transportation Needs  . Lack of Transportation (Medical): No  . Lack of Transportation (Non-Medical): No  Physical Activity:   . Days of Exercise per Week:   . Minutes of Exercise per Session:   Stress:   . Feeling of Stress :   Social Connections:   . Frequency of Communication with Friends and Family:   . Frequency of Social Gatherings with Friends and Family:   . Attends Religious Services:   . Active Member of Clubs or Organizations:   . Attends Archivist Meetings:   Marland Kitchen Marital Status:     Outpatient Encounter Medications as of 09/23/2019  Medication Sig  . cyclobenzaprine (FLEXERIL) 10 MG tablet Take ONE (1) tablet by mouth twice daily as needed for MUSCLE SPASMS  . fluocinonide gel (LIDEX) 0.05 % APPLY TO TONGUE TWICE DAILY AS NEEDED  . lisinopril (ZESTRIL) 10 MG tablet Take 1 tablet (10 mg total) by mouth daily.  . magic mouthwash SOLN Take 5 mLs by mouth 2 (two) times daily.  . methadone (DOLOPHINE) 10 MG/5ML solution Take 8 mg by mouth every morning.   . Multiple Vitamin (MULTIVITAMIN) tablet Take 1 tablet by mouth every morning.   . naproxen (NAPROSYN) 500 MG tablet Take 1 tablet (500 mg total) by mouth 2 (two) times daily with a meal.  . [DISCONTINUED] cyclobenzaprine (FLEXERIL) 10 MG tablet Take 0.5-1 tablets (5-10 mg total) by mouth 2 (two) times daily as needed for muscle spasms.   No facility-administered encounter medications on file as of 09/23/2019.    Activities of Daily Living In your present state of health, do you have any difficulty performing the following activities: 09/23/2019  Hearing? N  Vision? N  Difficulty concentrating or making decisions? N  Walking or climbing stairs? N  Dressing or bathing? N  Doing errands, shopping? N  Preparing Food and eating ? N  Using the Toilet? N  In the past six months, have you accidently leaked urine? N  Do you have problems with loss of bowel control? N  Managing your Medications? N  Managing your  Finances? N  Housekeeping or managing your Housekeeping? N  Some recent data might be hidden    Patient Care Team: [name], MD as PCP - General (Family Medicine)   Assessment:   This is a routine wellness examination for [place]. Physical assessment deferred to PCP.  Exercise Activities and Dietary recommendations Current Exercise Habits: Home exercise routine, Type of exercise: walking, Time (Minutes): 30, Frequency (Times/Week): 7, Weekly Exercise (Minutes/Week): 210, Exercise limited by: None identified   Diet (meal preparation, eat out, water intake, caffeinated beverages, dairy products, fruits and vegetables): 24 hr recall  Breakfast: cereal Lunch:  chicken salad sandwich Dinner:  Chicken pie, green beans, yams.  Goals    . Maintain healthy active lifestyle.       Fall Risk Fall Risk  09/23/2019 01/30/2017 01/27/2016 01/19/2015  Falls in the past year? 0 No No No  Number falls in past yr: 0 - - -  Injury with Fall? 0 - - -  Follow up Education provided;Falls prevention discussed - - -   Depression Screen PHQ 2/9 Scores 09/23/2019 01/30/2017 01/27/2016 01/19/2015  PHQ - 2 Score 0 0 0 0    Cognitive Function Ad8 score reviewed for issues:  Issues making decisions:no  Less interest in hobbies / activities:no  Repeats questions, stories (family complaining):no  Trouble using ordinary gadgets (microwave, computer, phone):no  Forgets the month or year: no  Mismanaging finances: no  Remembering appts:no  Daily problems with thinking and/or memory:no Ad8 score is=0         Immunization History  Administered Date(s) Administered  . Hepatitis B, adult 05/01/2016, 06/01/2016, 10/31/2016  . Influenza Split 05/16/2011, 05/14/2012  . Influenza Whole 03/26/2009  . Influenza, High Dose Seasonal PF 05/01/2016, 08/02/2017  . Influenza,inj,Quad PF,6+ Mos 05/13/2013, 07/21/2014, 07/29/2015  . Pneumococcal Conjugate-13 07/29/2015  . Pneumococcal Polysaccharide-23  07/20/2008, 01/30/2017  . Td 03/26/2009  . Zoster 05/16/2011   Screening Tests Health Maintenance  Topic Date Due  . TETANUS/TDAP  03/27/2019  . INFLUENZA VACCINE  01/11/2020  . COLONOSCOPY  08/11/2020  . Hepatitis C Screening  Completed  . PNA vac Low Risk Adult  Completed       Plan:    Please schedule your next medicare wellness visit with me in 1 yr.  Continue to eat heart healthy diet (full of fruits, vegetables, whole grains, lean protein, water--limit salt, fat, and sugar intake) and increase physical activity as tolerated.  Continue doing brain stimulating activities (puzzles, reading, adult coloring books, staying active) to keep memory sharp.   Bring a copy of your living will and/or healthcare power of attorney to your next office visit.   I have personally reviewed and noted the following in the patient's chart:   . Medical and social history . Use of alcohol, tobacco or illicit drugs  . Current medications and supplements . Functional ability and status . Nutritional status . Physical activity . Advanced directives . List of other physicians . Hospitalizations, surgeries, and ER visits in previous 12 months . Vitals . Screenings to include cognitive, depression, and falls . Referrals and appointments  In addition, I have reviewed and discussed with patient certain preventive protocols, quality metrics, and best practice recommendations. A written personalized care plan for preventive services as well as general preventive health recommendations were provided to patient.     Shela Nevin, [place]  09/23/2019

## 2019-09-23 ENCOUNTER — Encounter: Payer: Self-pay | Admitting: *Deleted

## 2019-09-23 ENCOUNTER — Other Ambulatory Visit: Payer: Self-pay

## 2019-09-23 ENCOUNTER — Ambulatory Visit (INDEPENDENT_AMBULATORY_CARE_PROVIDER_SITE_OTHER): Payer: Medicare Other | Admitting: *Deleted

## 2019-09-23 DIAGNOSIS — Z0000 Encounter for general adult medical examination without abnormal findings: Secondary | ICD-10-CM

## 2019-09-23 NOTE — Patient Instructions (Addendum)
Please schedule your next medicare wellness visit with me in 1 yr.  Continue to eat heart healthy diet (full of fruits, vegetables, whole grains, lean protein, water--limit salt, fat, and sugar intake) and increase physical activity as tolerated.  Continue doing brain stimulating activities (puzzles, reading, adult coloring books, staying active) to keep memory sharp.   Bring a copy of your living will and/or healthcare power of attorney to your next office visit.   Mr. Shaun [name] , Thank you for taking time to come for your Medicare Wellness Visit. I appreciate your ongoing commitment to your health goals. Please review the following plan we discussed and let me know if I can assist you in the future.   These are the goals we discussed: Goals    . Maintain healthy active lifestyle.       This is a list of the screening recommended for you and due dates:  Health Maintenance  Topic Date Due  . Tetanus Vaccine  03/27/2019  . Flu Shot  01/11/2020  . Colon Cancer Screening  08/11/2020  .  Hepatitis C: One time screening is recommended by Center for Disease Control  (CDC) for  adults born from [birth year] through 1966.   Completed  . Pneumonia vaccines  Completed    Preventive Care 53 Years and Older, Male Preventive care refers to lifestyle choices and visits with your health care provider that can promote health and wellness. This includes:  A yearly physical exam. This is also called an annual well check.  Regular dental and eye exams.  Immunizations.  Screening for certain conditions.  Healthy lifestyle choices, such as diet and exercise. What can I expect for my preventive care visit? Physical exam Your health care provider will check:  Height and weight. These may be used to calculate body mass index (BMI), which is a measurement that tells if you are at a healthy weight.  Heart rate and blood pressure.  Your skin for abnormal spots. Counseling Your health care provider  may ask you questions about:  Alcohol, tobacco, and drug use.  Emotional well-being.  Home and relationship well-being.  Sexual activity.  Eating habits.  History of falls.  Memory and ability to understand (cognition).  Work and work Statistician. What immunizations do I need?  Influenza (flu) vaccine  This is recommended every year. Tetanus, diphtheria, and pertussis (Tdap) vaccine  You may need a Td booster every 10 years. Varicella (chickenpox) vaccine  You may need this vaccine if you have not already been vaccinated. Zoster (shingles) vaccine  You may need this after age 69. Pneumococcal conjugate (PCV13) vaccine  One dose is recommended after age 83. Pneumococcal polysaccharide (PPSV23) vaccine  One dose is recommended after age 29. Measles, mumps, and rubella (MMR) vaccine  You may need at least one dose of MMR if you were born in 1957 or later. You may also need a second dose. Meningococcal conjugate (MenACWY) vaccine  You may need this if you have certain conditions. Hepatitis A vaccine  You may need this if you have certain conditions or if you travel or work in places where you may be exposed to hepatitis A. Hepatitis B vaccine  You may need this if you have certain conditions or if you travel or work in places where you may be exposed to hepatitis B. Haemophilus influenzae type b (Hib) vaccine  You may need this if you have certain conditions. You may receive vaccines as individual doses or as more than one vaccine together  in one shot (combination vaccines). Talk with your health care provider about the risks and benefits of combination vaccines. What tests do I need? Blood tests  Lipid and cholesterol levels. These may be checked every 5 years, or more frequently depending on your overall health.  Hepatitis C test.  Hepatitis B test. Screening  Lung cancer screening. You may have this screening every year starting at age 64 if you have a  30-pack-year history of smoking and currently smoke or have quit within the past 15 years.  Colorectal cancer screening. All adults should have this screening starting at age 53 and continuing until age 78. Your health care provider may recommend screening at age 71 if you are at increased risk. You will have tests every 1-10 years, depending on your results and the type of screening test.  Prostate cancer screening. Recommendations will vary depending on your family history and other risks.  Diabetes screening. This is done by checking your blood sugar (glucose) after you have not eaten for a while (fasting). You may have this done every 1-3 years.  Abdominal aortic aneurysm (AAA) screening. You may need this if you are a current or former smoker.  Sexually transmitted disease (STD) testing. Follow these instructions at home: Eating and drinking  Eat a diet that includes fresh fruits and vegetables, whole grains, lean protein, and low-fat dairy products. Limit your intake of foods with high amounts of sugar, saturated fats, and salt.  Take vitamin and mineral supplements as recommended by your health care provider.  Do not drink alcohol if your health care provider tells you not to drink.  If you drink alcohol: ? Limit how much you have to 0-2 drinks a day. ? Be aware of how much alcohol is in your drink. In the [place] one drink equals one 12 oz bottle of beer (355 mL), one 5 oz glass of wine (148 mL), or one 1 oz glass of hard liquor (44 mL). Lifestyle  Take daily care of your teeth and gums.  Stay active. Exercise for at least 30 minutes on 5 or more days each week.  Do not use any products that contain nicotine or tobacco, such as cigarettes, e-cigarettes, and chewing tobacco. If you need help quitting, ask your health care provider.  If you are sexually active, practice safe sex. Use a condom or other form of protection to prevent STIs (sexually transmitted infections).  Talk  with your health care provider about taking a low-dose aspirin or statin. What's next?  Visit your health care provider once a year for a well check visit.  Ask your health care provider how often you should have your eyes and teeth checked.  Stay up to date on all vaccines. This information is not intended to replace advice given to you by your health care provider. Make sure you discuss any questions you have with your health care provider. Document Revised: 05/23/2018 Document Reviewed: 05/23/2018 Elsevier Patient Education  2020 [employer].

## 2019-09-25 ENCOUNTER — Other Ambulatory Visit: Payer: Self-pay

## 2019-09-25 ENCOUNTER — Ambulatory Visit (INDEPENDENT_AMBULATORY_CARE_PROVIDER_SITE_OTHER): Payer: Medicare Other | Admitting: Family Medicine

## 2019-09-25 ENCOUNTER — Encounter: Payer: Self-pay | Admitting: Family Medicine

## 2019-09-25 VITALS — BP 132/72 | HR 66 | Temp 97.7°F | Resp 12 | Ht 72.0 in | Wt 165.4 lb

## 2019-09-25 DIAGNOSIS — K219 Gastro-esophageal reflux disease without esophagitis: Secondary | ICD-10-CM

## 2019-09-25 DIAGNOSIS — Z0000 Encounter for general adult medical examination without abnormal findings: Secondary | ICD-10-CM | POA: Diagnosis not present

## 2019-09-25 DIAGNOSIS — R3911 Hesitancy of micturition: Secondary | ICD-10-CM | POA: Diagnosis not present

## 2019-09-25 DIAGNOSIS — R197 Diarrhea, unspecified: Secondary | ICD-10-CM

## 2019-09-25 DIAGNOSIS — R739 Hyperglycemia, unspecified: Secondary | ICD-10-CM | POA: Diagnosis not present

## 2019-09-25 DIAGNOSIS — I10 Essential (primary) hypertension: Secondary | ICD-10-CM

## 2019-09-25 DIAGNOSIS — E785 Hyperlipidemia, unspecified: Secondary | ICD-10-CM | POA: Diagnosis not present

## 2019-09-25 DIAGNOSIS — Z7189 Other specified counseling: Secondary | ICD-10-CM

## 2019-09-25 DIAGNOSIS — M791 Myalgia, unspecified site: Secondary | ICD-10-CM

## 2019-09-25 LAB — PSA: PSA: 0.74 ng/mL (ref 0.10–4.00)

## 2019-09-25 LAB — LIPID PANEL
Cholesterol: 141 mg/dL (ref 0–200)
HDL: 55 mg/dL (ref >39.00)
LDL Cholesterol: 73 mg/dL (ref 0–99)
NonHDL: 85.89
Total CHOL/HDL Ratio: 3
Triglycerides: 64 mg/dL (ref 0.0–149.0)
VLDL: 12.8 mg/dL (ref 0.0–40.0)

## 2019-09-25 LAB — CBC
HCT: 42.7 % (ref 39.0–52.0)
Hemoglobin: 14.5 g/dL (ref 13.0–17.0)
MCHC: 34 g/dL (ref 30.0–36.0)
MCV: 91.1 fl (ref 78.0–100.0)
Platelets: 145 10*3/uL — ABNORMAL LOW (ref 150.0–400.0)
RBC: 4.69 Mil/uL (ref 4.22–5.81)
RDW: 13 % (ref 11.5–15.5)
WBC: 3.9 10*3/uL — ABNORMAL LOW (ref 4.0–10.5)

## 2019-09-25 LAB — COMPREHENSIVE METABOLIC PANEL
ALT: 13 U/L (ref 0–53)
AST: 19 U/L (ref 0–37)
Albumin: 5.1 g/dL (ref 3.5–5.2)
Alkaline Phosphatase: 63 U/L (ref 39–117)
BUN: 17 mg/dL (ref 6–23)
CO2: 32 mEq/L (ref 19–32)
Calcium: 9.8 mg/dL (ref 8.4–10.5)
Chloride: 98 mEq/L (ref 96–112)
Creatinine, Ser: 1.19 mg/dL (ref 0.40–1.50)
GFR: 60.43 mL/min (ref >60.00)
Glucose, Bld: 85 mg/dL (ref 70–99)
Potassium: 4.5 mEq/L (ref 3.5–5.1)
Sodium: 138 mEq/L (ref 135–145)
Total Bilirubin: 0.8 mg/dL (ref 0.2–1.2)
Total Protein: 7.3 g/dL (ref 6.0–8.3)

## 2019-09-25 LAB — HEMOGLOBIN A1C: Hgb A1c MFr Bld: 5.8 % (ref 4.6–6.5)

## 2019-09-25 LAB — TSH: TSH: 1.33 u[IU]/mL (ref 0.35–4.50)

## 2019-09-25 MED ORDER — TIZANIDINE HCL 2 MG PO TABS: 1.0000 mg | ORAL_TABLET | Freq: Every evening | ORAL | 1 refills | Status: DC | PRN

## 2019-09-25 NOTE — Assessment & Plan Note (Signed)
hgba1c acceptable, minimize simple carbs. Increase exercise as tolerated.  

## 2019-09-25 NOTE — Patient Instructions (Signed)
Shingrix is the new shingles shot take 1 shot and then 2 nd shot 2-6 months later. Space from covid shot by at least 14 days. Can take a tetanus shot with the Shingrix   Omron Blood Pressure cuff, upper arm, want BP 100-140/60-90 Pulse oximeter, want oxygen in 90s  Weekly vitals  Take Multivitamin with minerals, selenium Vitamin D 1000-2000 IU daily Probiotic with lactobacillus and bifidophilus Asprin EC 81 mg daily Fish oil or krill oil daily Melatonin 2-5 mg at bedtime  [email or web address].[email or web address] ToxicBlast.[email or web address]   Preventive Care 65 Years and Older, Male Preventive care refers to lifestyle choices and visits with your health care provider that can promote health and wellness. This includes:  A yearly physical exam. This is also called an annual well check.  Regular dental and eye exams.  Immunizations.  Screening for certain conditions.  Healthy lifestyle choices, such as diet and exercise. What can I expect for my preventive care visit? Physical exam Your health care provider will check:  Height and weight. These may be used to calculate body mass index (BMI), which is a measurement that tells if you are at a healthy weight.  Heart rate and blood pressure.  Your skin for abnormal spots. Counseling Your health care provider may ask you questions about:  Alcohol, tobacco, and drug use.  Emotional well-being.  Home and relationship well-being.  Sexual activity.  Eating habits.  History of falls.  Memory and ability to understand (cognition).  Work and work Statistician. What immunizations do I need?  Influenza (flu) vaccine  This is recommended every year. Tetanus, diphtheria, and pertussis (Tdap) vaccine  You may need a Td booster every 10 years. Varicella (chickenpox) vaccine  You may need this vaccine if you have not already been vaccinated. Zoster (shingles) vaccine  You may need this after age 15. Pneumococcal conjugate (PCV13)  vaccine  One dose is recommended after age 46. Pneumococcal polysaccharide (PPSV23) vaccine  One dose is recommended after age 15. Measles, mumps, and rubella (MMR) vaccine  You may need at least one dose of MMR if you were born in 1957 or later. You may also need a second dose. Meningococcal conjugate (MenACWY) vaccine  You may need this if you have certain conditions. Hepatitis A vaccine  You may need this if you have certain conditions or if you travel or work in places where you may be exposed to hepatitis A. Hepatitis B vaccine  You may need this if you have certain conditions or if you travel or work in places where you may be exposed to hepatitis B. Haemophilus influenzae type b (Hib) vaccine  You may need this if you have certain conditions. You may receive vaccines as individual doses or as more than one vaccine together in one shot (combination vaccines). Talk with your health care provider about the risks and benefits of combination vaccines. What tests do I need? Blood tests  Lipid and cholesterol levels. These may be checked every 5 years, or more frequently depending on your overall health.  Hepatitis C test.  Hepatitis B test. Screening  Lung cancer screening. You may have this screening every year starting at age 51 if you have a 30-pack-year history of smoking and currently smoke or have quit within the past 15 years.  Colorectal cancer screening. All adults should have this screening starting at age 74 and continuing until age 75. Your health care provider may recommend screening at age 17 if you are at increased risk. You will have tests  every 1-10 years, depending on your results and the type of screening test.  Prostate cancer screening. Recommendations will vary depending on your family history and other risks.  Diabetes screening. This is done by checking your blood sugar (glucose) after you have not eaten for a while (fasting). You may have this done  every 1-3 years.  Abdominal aortic aneurysm (AAA) screening. You may need this if you are a current or former smoker.  Sexually transmitted disease (STD) testing. Follow these instructions at home: Eating and drinking  Eat a diet that includes fresh fruits and vegetables, whole grains, lean protein, and low-fat dairy products. Limit your intake of foods with high amounts of sugar, saturated fats, and salt.  Take vitamin and mineral supplements as recommended by your health care provider.  Do not drink alcohol if your health care provider tells you not to drink.  If you drink alcohol: ? Limit how much you have to 0-2 drinks a day. ? Be aware of how much alcohol is in your drink. In the [place] one drink equals one 12 oz bottle of beer (355 mL), one 5 oz glass of wine (148 mL), or one 1 oz glass of hard liquor (44 mL). Lifestyle  Take daily care of your teeth and gums.  Stay active. Exercise for at least 30 minutes on 5 or more days each week.  Do not use any products that contain nicotine or tobacco, such as cigarettes, e-cigarettes, and chewing tobacco. If you need help quitting, ask your health care provider.  If you are sexually active, practice safe sex. Use a condom or other form of protection to prevent STIs (sexually transmitted infections).  Talk with your health care provider about taking a low-dose aspirin or statin. What's next?  Visit your health care provider once a year for a well check visit.  Ask your health care provider how often you should have your eyes and teeth checked.  Stay up to date on all vaccines. This information is not intended to replace advice given to you by your health care provider. Make sure you discuss any questions you have with your health care provider. Document Revised: 05/23/2018 Document Reviewed: 05/23/2018 Elsevier Patient Education  2020 [employer].

## 2019-09-26 LAB — SAR COV2 SEROLOGY (COVID19)AB(IGG),IA: DiaSorin SARS-CoV-2 Ab, IgG: NEGATIVE

## 2019-09-27 DIAGNOSIS — Z7189 Other specified counseling: Secondary | ICD-10-CM | POA: Insufficient documentation

## 2019-09-27 NOTE — Progress Notes (Signed)
Subjective:    Patient ID: Shaun [name], male    DOB: 1951-03-02, 69 y.o.   MRN: [id number]  Chief Complaint  Patient presents with  . Annual Exam    HPI Patient is in today for annual preventative exam. No recent febrile illness or hospitalizations. He was ill several months ago and wonders if he had COVID. He has held off on taking the immunization as a result. Otherwise he has felt well. Is struggling with myalgias and back pain. Denies CP/palp/SOB/HA/congestion/fevers/GI or GU c/o. Taking meds as prescribed. No recent fall or injury. Is trying to maintain a heart healthy diet and stay active although his pain has limited this some.   Past Medical History:  Diagnosis Date  . Allergic state 01/30/2017  . Arm mass   . Arm skin lesion, left 11/12/2012   And sun damaged skin  . Elevated BP 11/14/2012  . Encounter for Medicare annual wellness exam 01/27/2016  . GERD (gastroesophageal reflux disease) 01/27/2016  . Hepatitis C, chronic ([place])   . History of intravenous drug use in remission   . History of oral lesions 05/14/2013  . History of oral lesions 07/29/2015  . Hypertension 11/14/2012  . Left shoulder pain 12/16/2012  . 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 Myalgia and myositis 08/13/2014  . Preventative health care 05/14/2013  . Preventative health care 05/14/2013    Past Surgical History:  Procedure Laterality Date  . KNEE ARTHROSCOPY  1996   ligment repair    Family History  Problem Relation Age of Onset  . Heart disease Father   . Diabetes Father   . Hyperlipidemia Father   . Hypertension Father   . Thyroid disease Sister   . Heart disease Cousin        MI  . Diabetes Cousin   . Obesity Cousin   . Osteoporosis Mother   . Diabetes Paternal Grandfather   . Thyroid disease Maternal Grandmother   . Cancer Neg Hx        negative for colon and prostate    Social History   Socioeconomic History  . Marital status: Married    Spouse name: Not on file  . Number of  children: Not on file  . Years of education: Not on file  . Highest education level: Not on file  Occupational History  . Not on file  Tobacco Use  . Smoking status: Never Smoker  . Smokeless tobacco: Never Used  Substance and Sexual Activity  . Alcohol use: No    Alcohol/week: 0.0 standard drinks  . Drug use: No  . Sexual activity: Yes    Comment: avoids nuts, fruits, lives with wife, makes picture frames, building maintenance  Other Topics Concern  . Not on file  Social History Narrative  . Not on file   Social Determinants of Health   Financial Resource Strain: Low Risk   . Difficulty of Paying Living Expenses: Not hard at all  Food Insecurity: No Food Insecurity  . Worried About Charity fundraiser in the Last Year: Never true  . Ran Out of Food in the Last Year: Never true  Transportation Needs: No Transportation Needs  . Lack of Transportation (Medical): No  . Lack of Transportation (Non-Medical): No  Physical Activity:   . Days of Exercise per Week:   . Minutes of Exercise per Session:   Stress:   . Feeling of Stress :   Social Connections:   . Frequency of Communication with Friends and Family:   .  Frequency of Social Gatherings with Friends and Family:   . Attends Religious Services:   . Active Member of Clubs or Organizations:   . Attends Archivist Meetings:   Marland Kitchen Marital Status:   Intimate Partner Violence:   . Fear of Current or Ex-Partner:   . Emotionally Abused:   Marland Kitchen Physically Abused:   . Sexually Abused:     Outpatient Medications Prior to Visit  Medication Sig Dispense Refill  . cyclobenzaprine (FLEXERIL) 10 MG tablet Take ONE (1) tablet by mouth twice daily as needed for MUSCLE SPASMS 30 tablet 1  . fluocinonide gel (LIDEX) 0.05 % APPLY TO TONGUE TWICE DAILY AS NEEDED 60 g 0  . lisinopril (ZESTRIL) 10 MG tablet Take 1 tablet (10 mg total) by mouth daily. 90 tablet 1  . magic mouthwash SOLN Take 5 mLs by mouth 2 (two) times daily. 240 mL 1   . methadone (DOLOPHINE) 10 MG/5ML solution Take 8 mg by mouth every morning.     . Multiple Vitamin (MULTIVITAMIN) tablet Take 1 tablet by mouth every morning.     . naproxen (NAPROSYN) 500 MG tablet Take 1 tablet (500 mg total) by mouth 2 (two) times daily with a meal. 60 tablet 1   No facility-administered medications prior to visit.    No Known Allergies  Review of Systems  Constitutional: Positive for malaise/fatigue. Negative for chills and fever.  HENT: Negative for congestion and hearing loss.   Eyes: Negative for discharge.  Respiratory: Negative for cough, sputum production and shortness of breath.   Cardiovascular: Negative for chest pain, palpitations and leg swelling.  Gastrointestinal: Negative for abdominal pain, blood in stool, constipation, diarrhea, heartburn, nausea and vomiting.  Genitourinary: Negative for dysuria, frequency, hematuria and urgency.  Musculoskeletal: Positive for back pain and myalgias. Negative for falls.  Skin: Negative for rash.  Neurological: Negative for dizziness, sensory change, loss of consciousness, weakness and headaches.  Endo/Heme/Allergies: Negative for environmental allergies. Does not bruise/bleed easily.  Psychiatric/Behavioral: Negative for depression and suicidal ideas. The patient is not nervous/anxious and does not have insomnia.        Objective:    Physical Exam Vitals and nursing note reviewed.  Constitutional:      General: He is not in acute distress.    Appearance: Normal appearance. He is well-developed. He is not ill-appearing.  HENT:     Head: Normocephalic and atraumatic.     Right Ear: Tympanic membrane and external ear normal.     Left Ear: Tympanic membrane and external ear normal.     Nose: Nose normal.  Eyes:     General:        Right eye: No discharge.        Left eye: No discharge.     Extraocular Movements: Extraocular movements intact.     Conjunctiva/sclera: Conjunctivae normal.     Pupils: Pupils  are equal, round, and reactive to light.  Cardiovascular:     Rate and Rhythm: Normal rate and regular rhythm.     Heart sounds: No murmur.  Pulmonary:     Effort: Pulmonary effort is normal.     Breath sounds: Normal breath sounds. No wheezing.  Abdominal:     General: Bowel sounds are normal.     Palpations: Abdomen is soft. There is no mass.     Tenderness: There is no abdominal tenderness. There is no guarding.  Musculoskeletal:     Cervical back: Normal range of motion and neck supple.  Skin:    General: Skin is warm and dry.  Neurological:     General: No focal deficit present.     Mental Status: He is alert and oriented to person, place, and time.  Psychiatric:        Mood and Affect: Mood normal.        Behavior: Behavior normal.     BP 132/72 (BP Location: Left Arm, Cuff Size: Normal)   Pulse 66   Temp 97.7 F (36.5 C) (Temporal)   Resp 12   Ht 6' (1.829 m)   Wt 165 lb 6.4 oz (75 kg)   SpO2 98%   BMI 22.43 kg/m  Wt Readings from Last 3 Encounters:  09/25/19 165 lb 6.4 oz (75 kg)  08/27/18 166 lb 12.8 oz (75.7 kg)  01/21/18 166 lb 3.2 oz (75.4 kg)    Diabetic Foot Exam - Simple   No data filed     Lab Results  Component Value Date   WBC 3.9 (L) 09/25/2019   HGB 14.5 09/25/2019   HCT 42.7 09/25/2019   PLT 145.0 (L) 09/25/2019   GLUCOSE 85 09/25/2019   CHOL 141 09/25/2019   TRIG 64.0 09/25/2019   HDL 55.00 09/25/2019   LDLCALC 73 09/25/2019   ALT 13 09/25/2019   AST 19 09/25/2019   NA 138 09/25/2019   K 4.5 09/25/2019   CL 98 09/25/2019   CREATININE 1.19 09/25/2019   BUN 17 09/25/2019   CO2 32 09/25/2019   TSH 1.33 09/25/2019   PSA 0.74 09/25/2019   INR 1.05 07/04/2012   HGBA1C 5.8 09/25/2019    Lab Results  Component Value Date   TSH 1.33 09/25/2019   Lab Results  Component Value Date   WBC 3.9 (L) 09/25/2019   HGB 14.5 09/25/2019   HCT 42.7 09/25/2019   MCV 91.1 09/25/2019   PLT 145.0 (L) 09/25/2019   Lab Results  Component  Value Date   NA 138 09/25/2019   K 4.5 09/25/2019   CO2 32 09/25/2019   GLUCOSE 85 09/25/2019   BUN 17 09/25/2019   CREATININE 1.19 09/25/2019   BILITOT 0.8 09/25/2019   ALKPHOS 63 09/25/2019   AST 19 09/25/2019   ALT 13 09/25/2019   PROT 7.3 09/25/2019   ALBUMIN 5.1 09/25/2019   CALCIUM 9.8 09/25/2019   GFR 60.43 09/25/2019   Lab Results  Component Value Date   CHOL 141 09/25/2019   Lab Results  Component Value Date   HDL 55.00 09/25/2019   Lab Results  Component Value Date   LDLCALC 73 09/25/2019   Lab Results  Component Value Date   TRIG 64.0 09/25/2019   Lab Results  Component Value Date   CHOLHDL 3 09/25/2019   Lab Results  Component Value Date   HGBA1C 5.8 09/25/2019       Assessment & Plan:   Problem List Items Addressed This Visit    Hyperglycemia    hgba1c acceptable, minimize simple carbs. Increase exercise as tolerated.       Relevant Orders   Hemoglobin A1c (Completed)   Hypertension    Well controlled, no changes to meds. Encouraged heart healthy diet such as the DASH diet and exercise as tolerated.       Relevant Orders   CBC (Completed)   Comprehensive metabolic panel (Completed)   TSH (Completed)   Preventative health care - Primary    Patient encouraged to maintain heart healthy diet, regular exercise, adequate sleep. Consider daily probiotics. Take medications  as prescribed. Labs ordered and reviewed      Urinary hesitancy    Check PSA today      Relevant Orders   PSA (Completed)   Myalgia    Encouraged moist heat and gentle stretching as tolerated. May try NSAIDs and prescription meds as directed and report if symptoms worsen or seek immediate care. He will clear with pain management but is allowed a few Tizanidine 1-4 mg qhs prn to try      GERD (gastroesophageal reflux disease)    Avoid offending foods, start probiotics. Do not eat large meals in late evening and consider raising head of bed.       Hyperlipidemia     encouraged heart healthy diet, avoid trans fats, minimize simple carbs and saturated fats. Increase exercise as tolerated      Relevant Orders   Lipid panel (Completed)   Educated about COVID-19 virus infection    He was ill several months ago and wonders if it was a COVID infection but his antibody test was negative. He is encouraged to take the [place] vaccination.  Omron Blood Pressure cuff, upper arm, want BP 100-140/60-90 Pulse oximeter, want oxygen in 90s  Weekly vitals  Take Multivitamin with minerals, selenium Vitamin D 1000-2000 IU daily Probiotic with lactobacillus and bifidophilus Asprin EC 81 mg daily Fish oil caps Melatonin 2-5 mg at bedtime  [email or web address]       Other Visit Diagnoses    Diarrhea, unspecified type       Relevant Orders   SAR CoV2 Serology (COVID 19)AB(IGG)IA (Completed)      I am having Ivor Reining start on tiZANidine. I am also having him maintain his methadone, multivitamin, naproxen, cyclobenzaprine, fluocinonide gel, lisinopril, and magic mouthwash.  Meds ordered this encounter  Medications  . tiZANidine (ZANAFLEX) 2 MG tablet    Sig: Take 0.5-2 tablets (1-4 mg total) by mouth at bedtime as needed for muscle spasms.    Dispense:  30 tablet    Refill:  1     Penni Homans, MD

## 2019-09-27 NOTE — Assessment & Plan Note (Signed)
Well controlled, no changes to meds. Encouraged heart healthy diet such as the DASH diet and exercise as tolerated.  °

## 2019-09-27 NOTE — Assessment & Plan Note (Signed)
He was ill several months ago and wonders if it was a COVID infection but his antibody test was negative. He is encouraged to take the [place] vaccination.  Omron Blood Pressure cuff, upper arm, want BP 100-140/60-90 Pulse oximeter, want oxygen in 90s  Weekly vitals  Take Multivitamin with minerals, selenium Vitamin D 1000-2000 IU daily Probiotic with lactobacillus and bifidophilus Asprin EC 81 mg daily Fish oil caps Melatonin 2-5 mg at bedtime  [email or web address]

## 2019-09-27 NOTE — Assessment & Plan Note (Signed)
encouraged heart healthy diet, avoid trans fats, minimize simple carbs and saturated fats. Increase exercise as tolerated 

## 2019-09-27 NOTE — Assessment & Plan Note (Addendum)
Patient encouraged to maintain heart healthy diet, regular exercise, adequate sleep. Consider daily probiotics. Take medications as prescribed. Labs ordered and reviewed 

## 2019-09-27 NOTE — Assessment & Plan Note (Signed)
Check PSA today. 

## 2019-09-27 NOTE — Assessment & Plan Note (Signed)
Encouraged moist heat and gentle stretching as tolerated. May try NSAIDs and prescription meds as directed and report if symptoms worsen or seek immediate care. He will clear with pain management but is allowed a few Tizanidine 1-4 mg qhs prn to try

## 2019-09-27 NOTE — Assessment & Plan Note (Signed)
Avoid offending foods, start probiotics. Do not eat large meals in late evening and consider raising head of bed.  

## 2019-11-12 DIAGNOSIS — Z0120 Encounter for dental examination and cleaning without abnormal findings: Secondary | ICD-10-CM | POA: Diagnosis not present

## 2019-11-17 ENCOUNTER — Other Ambulatory Visit: Payer: Self-pay | Admitting: Family Medicine

## 2020-02-09 DIAGNOSIS — H25013 Cortical age-related cataract, bilateral: Secondary | ICD-10-CM | POA: Diagnosis not present

## 2020-02-09 DIAGNOSIS — H5202 Hypermetropia, left eye: Secondary | ICD-10-CM | POA: Diagnosis not present

## 2020-02-09 DIAGNOSIS — H5211 Myopia, right eye: Secondary | ICD-10-CM | POA: Diagnosis not present

## 2020-02-09 DIAGNOSIS — H524 Presbyopia: Secondary | ICD-10-CM | POA: Diagnosis not present

## 2020-02-09 DIAGNOSIS — H2513 Age-related nuclear cataract, bilateral: Secondary | ICD-10-CM | POA: Diagnosis not present

## 2020-03-25 ENCOUNTER — Other Ambulatory Visit: Payer: Self-pay

## 2020-03-25 ENCOUNTER — Ambulatory Visit (INDEPENDENT_AMBULATORY_CARE_PROVIDER_SITE_OTHER): Payer: Medicare Other | Admitting: Family Medicine

## 2020-03-25 ENCOUNTER — Encounter: Payer: Self-pay | Admitting: Family Medicine

## 2020-03-25 DIAGNOSIS — I10 Essential (primary) hypertension: Secondary | ICD-10-CM

## 2020-03-25 DIAGNOSIS — R739 Hyperglycemia, unspecified: Secondary | ICD-10-CM | POA: Diagnosis not present

## 2020-03-25 DIAGNOSIS — E785 Hyperlipidemia, unspecified: Secondary | ICD-10-CM | POA: Diagnosis not present

## 2020-03-25 DIAGNOSIS — R3911 Hesitancy of micturition: Secondary | ICD-10-CM

## 2020-03-25 DIAGNOSIS — M545 Low back pain, unspecified: Secondary | ICD-10-CM

## 2020-03-25 MED ORDER — TIZANIDINE HCL 2 MG PO TABS: 1.0000 mg | ORAL_TABLET | Freq: Every evening | ORAL | 1 refills | Status: DC | PRN

## 2020-03-25 MED ORDER — MAGIC MOUTHWASH
5.0000 mL | Freq: Two times a day (BID) | ORAL | 1 refills | Status: DC
Start: 2020-03-25 — End: 2020-09-23

## 2020-03-25 NOTE — Assessment & Plan Note (Addendum)
Encouraged moist heat and gentle stretching as tolerated. May try NSAIDs and prescription meds as directed and report if symptoms worsen or seek immediate care. Notes increased radicular symptoms into right leg. Can continue Tylenol to a max of 1000 mg tid. Add Tizanidine 1-4 mg bid prn and follow up with pain management

## 2020-03-25 NOTE — Assessment & Plan Note (Signed)
Well controlled, no changes to meds. Encouraged heart healthy diet such as the DASH diet and exercise as tolerated.  °

## 2020-03-25 NOTE — Assessment & Plan Note (Signed)
hgba1c acceptable, minimize simple carbs. Increase exercise as tolerated.  

## 2020-03-25 NOTE — Assessment & Plan Note (Signed)
Encouraged heart healthy diet, increase exercise, avoid trans fats, consider a krill oil cap daily 

## 2020-03-25 NOTE — Patient Instructions (Addendum)
Apply moist heat and then a lidocaine patch  Chronic Back Pain When back pain lasts longer than 3 months, it is called chronic back pain.The cause of your back pain may not be known. Some common causes include:  Wear and tear (degenerative disease) of the bones, ligaments, or disks in your back.  Inflammation and stiffness in your back (arthritis). People who have chronic back pain often go through certain periods in which the pain is more intense (flare-ups). Many people can learn to manage the pain with home care. Follow these instructions at home: Pay attention to any changes in your symptoms. Take these actions to help with your pain: Activity   Avoid bending and other activities that make the problem worse.  Maintain a proper position when standing or sitting: ? When standing, keep your upper back and neck straight, with your shoulders pulled back. Avoid slouching. ? When sitting, keep your back straight and relax your shoulders. Do not round your shoulders or pull them backward.  Do not sit or stand in one place for long periods of time.  Take brief periods of rest throughout the day. This will reduce your pain. Resting in a lying or standing position is usually better than sitting to rest.  When you are resting for longer periods, mix in some mild activity or stretching between periods of rest. This will help to prevent stiffness and pain.  Get regular exercise. Ask your health care provider what activities are safe for you.  Do not lift anything that is heavier than 10 lb (4.5 kg). Always use proper lifting technique, which includes: ? Bending your knees. ? Keeping the load close to your body. ? Avoiding twisting.  Sleep on a firm mattress in a comfortable position. Try lying on your side with your knees slightly bent. If you lie on your back, put a pillow under your knees. Managing pain  If directed, apply ice to the painful area. Your health care provider may recommend  applying ice during the first 24-48 hours after a flare-up begins. ? Put ice in a plastic bag. ? Place a towel between your skin and the bag. ? Leave the ice on for 20 minutes, 2-3 times per day.  If directed, apply heat to the affected area as often as told by your health care provider. Use the heat source that your health care provider recommends, such as a moist heat pack or a heating pad. ? Place a towel between your skin and the heat source. ? Leave the heat on for 20-30 minutes. ? Remove the heat if your skin turns bright red. This is especially important if you are unable to feel pain, heat, or cold. You may have a greater risk of getting burned.  Try soaking in a warm tub.  Take over-the-counter and prescription medicines only as told by your health care provider.  Keep all follow-up visits as told by your health care provider. This is important. Contact a health care provider if:  You have pain that is not relieved with rest or medicine. Get help right away if:  You have weakness or numbness in one or both of your legs or feet.  You have trouble controlling your bladder or your bowels.  You have nausea or vomiting.  You have pain in your abdomen.  You have shortness of breath or you faint. This information is not intended to replace advice given to you by your health care provider. Make sure you discuss any questions you  have with your health care provider. Document Revised: 09/19/2018 Document Reviewed: 12/06/2016 Elsevier Patient Education  [place].

## 2020-03-26 LAB — COMPREHENSIVE METABOLIC PANEL
AG Ratio: 2.2 (calc) (ref 1.0–2.5)
ALT: 12 U/L (ref 9–46)
AST: 17 U/L (ref 10–35)
Albumin: 4.9 g/dL (ref 3.6–5.1)
Alkaline phosphatase (APISO): 56 U/L (ref 35–144)
BUN: 15 mg/dL (ref 7–25)
CO2: 31 mmol/L (ref 20–32)
Calcium: 9.7 mg/dL (ref 8.6–10.3)
Chloride: 100 mmol/L (ref 98–110)
Creat: 1.14 mg/dL (ref 0.70–1.18)
Globulin: 2.2 g/dL (calc) (ref 1.9–3.7)
Glucose, Bld: 103 mg/dL — ABNORMAL HIGH (ref 65–99)
Potassium: 4.4 mmol/L (ref 3.5–5.3)
Sodium: 138 mmol/L (ref 135–146)
Total Bilirubin: 0.8 mg/dL (ref 0.2–1.2)
Total Protein: 7.1 g/dL (ref 6.1–8.1)

## 2020-03-26 LAB — LIPID PANEL
Cholesterol: 140 mg/dL (ref <200)
HDL: 58 mg/dL (ref >=40)
LDL Cholesterol (Calc): 66 mg/dL (calc)
Non-HDL Cholesterol (Calc): 82 mg/dL (calc) (ref <130)
Total CHOL/HDL Ratio: 2.4 (calc) (ref <5.0)
Triglycerides: 76 mg/dL (ref <150)

## 2020-03-26 LAB — TSH: TSH: 1.1 mIU/L (ref 0.40–4.50)

## 2020-03-26 LAB — CBC
HCT: 42.1 % (ref 38.5–50.0)
Hemoglobin: 14.3 g/dL (ref 13.2–17.1)
MCH: 30.6 pg (ref 27.0–33.0)
MCHC: 34 g/dL (ref 32.0–36.0)
MCV: 90 fL (ref 80.0–100.0)
MPV: 10.1 fL (ref 7.5–12.5)
Platelets: 151 10*3/uL (ref 140–400)
RBC: 4.68 10*6/uL (ref 4.20–5.80)
RDW: 12.4 % (ref 11.0–15.0)
WBC: 3.9 10*3/uL (ref 3.8–10.8)

## 2020-03-26 LAB — HEMOGLOBIN A1C
Hgb A1c MFr Bld: 5.6 % of total Hgb (ref <5.7)
Mean Plasma Glucose: 114 (calc)
eAG (mmol/L): 6.3 (calc)

## 2020-03-28 NOTE — Progress Notes (Signed)
Subjective:    Patient ID: Shaun [name], male    DOB: 1951/06/09, 69 y.o.   MRN: [id number]  Chief Complaint  Patient presents with  . Follow-up    HPI Patient is in today for follow up on chronic medical concerns. No recent illness but he has had a UTI and pneumonia since his last visit. Feels much better now. Is noting increased low back pain with radicular pain to right hip and down right leg, even notes some numbness in thigh at times. No incontinence, recent fall or trauma. Has been working with pain management. Denies CP/palp/SOB/HA/congestion/fevers/GI or GU c/o. Taking meds as prescribed  Past Medical History:  Diagnosis Date  . Allergic state 01/30/2017  . Arm mass   . Arm skin lesion, left 11/12/2012   And sun damaged skin  . Elevated BP 11/14/2012  . Encounter for Medicare annual wellness exam 01/27/2016  . GERD (gastroesophageal reflux disease) 01/27/2016  . Hepatitis C, chronic ([place])   . History of intravenous drug use in remission   . History of oral lesions 05/14/2013  . History of oral lesions 07/29/2015  . Hypertension 11/14/2012  . Left shoulder pain 12/16/2012  . Methadone use 01/31/2015  . Myalgia and myositis 08/13/2014  . Preventative health care 05/14/2013  . Preventative health care 05/14/2013    Past Surgical History:  Procedure Laterality Date  . KNEE ARTHROSCOPY  1996   ligment repair    Family History  Problem Relation Age of Onset  . Heart disease Father   . Diabetes Father   . Hyperlipidemia Father   . Hypertension Father   . Thyroid disease Sister   . Heart disease Cousin        MI  . Diabetes Cousin   . Obesity Cousin   . Osteoporosis Mother   . Diabetes Paternal Grandfather   . Thyroid disease Maternal Grandmother   . Cancer Neg Hx        negative for colon and prostate    Social History   Socioeconomic History  . Marital status: Married    Spouse name: Not on file  . Number of children: Not on file  . Years of education: Not on  file  . Highest education level: Not on file  Occupational History  . Not on file  Tobacco Use  . Smoking status: Never Smoker  . Smokeless tobacco: Never Used  Vaping Use  . Vaping Use: Never used  Substance and Sexual Activity  . Alcohol use: No    Alcohol/week: 0.0 standard drinks  . Drug use: No  . Sexual activity: Yes    Comment: avoids nuts, fruits, lives with wife, makes picture frames, building maintenance  Other Topics Concern  . Not on file  Social History Narrative  . Not on file   Social Determinants of Health   Financial Resource Strain: Low Risk   . Difficulty of Paying Living Expenses: Not hard at all  Food Insecurity: No Food Insecurity  . Worried About Charity fundraiser in the Last Year: Never true  . Ran Out of Food in the Last Year: Never true  Transportation Needs: No Transportation Needs  . Lack of Transportation (Medical): No  . Lack of Transportation (Non-Medical): No  Physical Activity:   . Days of Exercise per Week: Not on file  . Minutes of Exercise per Session: Not on file  Stress:   . Feeling of Stress : Not on file  Social Connections:   . Frequency  of Communication with Friends and Family: Not on file  . Frequency of Social Gatherings with Friends and Family: Not on file  . Attends Religious Services: Not on file  . Active Member of Clubs or Organizations: Not on file  . Attends Archivist Meetings: Not on file  . Marital Status: Not on file  Intimate Partner Violence:   . Fear of Current or Ex-Partner: Not on file  . Emotionally Abused: Not on file  . Physically Abused: Not on file  . Sexually Abused: Not on file    Outpatient Medications Prior to Visit  Medication Sig Dispense Refill  . fluocinonide gel (LIDEX) 0.05 % APPLY TO TONGUE TWICE DAILY AS NEEDED 60 g 0  . lisinopril (ZESTRIL) 10 MG tablet TAKE ONE TABLET BY MOUTH ONCE DAILY 90 tablet 1  . methadone (DOLOPHINE) 10 MG/5ML solution Take 8 mg by mouth every  morning.     . Multiple Vitamin (MULTIVITAMIN) tablet Take 1 tablet by mouth every morning.     . cyclobenzaprine (FLEXERIL) 10 MG tablet Take ONE (1) tablet by mouth twice daily as needed for MUSCLE SPASMS 30 tablet 1  . magic mouthwash SOLN Take 5 mLs by mouth 2 (two) times daily. 240 mL 1  . naproxen (NAPROSYN) 500 MG tablet Take 1 tablet (500 mg total) by mouth 2 (two) times daily with a meal. 60 tablet 1  . tiZANidine (ZANAFLEX) 2 MG tablet Take 0.5-2 tablets (1-4 mg total) by mouth at bedtime as needed for muscle spasms. 30 tablet 1   No facility-administered medications prior to visit.    No Known Allergies  Review of Systems  Constitutional: Negative for fever and malaise/fatigue.  HENT: Negative for congestion.   Eyes: Negative for blurred vision.  Respiratory: Negative for shortness of breath.   Cardiovascular: Negative for chest pain, palpitations and leg swelling.  Gastrointestinal: Negative for abdominal pain, blood in stool and nausea.  Genitourinary: Negative for dysuria and frequency.  Musculoskeletal: Positive for back pain and joint pain. Negative for falls.  Skin: Negative for rash.  Neurological: Negative for dizziness, loss of consciousness and headaches.  Endo/Heme/Allergies: Negative for environmental allergies.  Psychiatric/Behavioral: Negative for depression. The patient is not nervous/anxious.        Objective:    Physical Exam Vitals and nursing note reviewed.  Constitutional:      General: He is not in acute distress.    Appearance: He is well-developed.  HENT:     Head: Normocephalic and atraumatic.     Nose: Nose normal.  Eyes:     General:        Right eye: No discharge.        Left eye: No discharge.  Cardiovascular:     Rate and Rhythm: Normal rate and regular rhythm.     Heart sounds: No murmur heard.   Pulmonary:     Effort: Pulmonary effort is normal.     Breath sounds: Normal breath sounds.  Abdominal:     General: Bowel sounds  are normal.     Palpations: Abdomen is soft.     Tenderness: There is no abdominal tenderness.  Musculoskeletal:     Cervical back: Normal range of motion and neck supple.  Skin:    General: Skin is warm and dry.  Neurological:     Mental Status: He is alert and oriented to person, place, and time.     BP 130/71 (BP Location: Left Arm, Patient Position: Sitting, Cuff Size: Small)  Pulse 80   Temp 98.3 F (36.8 C) (Oral)   Resp 12   Ht 6' (1.829 m)   Wt 165 lb 6.4 oz (75 kg)   SpO2 99%   BMI 22.43 kg/m  Wt Readings from Last 3 Encounters:  03/25/20 165 lb 6.4 oz (75 kg)  09/25/19 165 lb 6.4 oz (75 kg)  08/27/18 166 lb 12.8 oz (75.7 kg)    Diabetic Foot Exam - Simple   No data filed     Lab Results  Component Value Date   WBC 3.9 03/25/2020   HGB 14.3 03/25/2020   HCT 42.1 03/25/2020   PLT 151 03/25/2020   GLUCOSE 103 (H) 03/25/2020   CHOL 140 03/25/2020   TRIG 76 03/25/2020   HDL 58 03/25/2020   LDLCALC 66 03/25/2020   ALT 12 03/25/2020   AST 17 03/25/2020   NA 138 03/25/2020   K 4.4 03/25/2020   CL 100 03/25/2020   CREATININE 1.14 03/25/2020   BUN 15 03/25/2020   CO2 31 03/25/2020   TSH 1.10 03/25/2020   PSA 0.74 09/25/2019   INR 1.05 07/04/2012   HGBA1C 5.6 03/25/2020    Lab Results  Component Value Date   TSH 1.10 03/25/2020   Lab Results  Component Value Date   WBC 3.9 03/25/2020   HGB 14.3 03/25/2020   HCT 42.1 03/25/2020   MCV 90.0 03/25/2020   PLT 151 03/25/2020   Lab Results  Component Value Date   NA 138 03/25/2020   K 4.4 03/25/2020   CO2 31 03/25/2020   GLUCOSE 103 (H) 03/25/2020   BUN 15 03/25/2020   CREATININE 1.14 03/25/2020   BILITOT 0.8 03/25/2020   ALKPHOS 63 09/25/2019   AST 17 03/25/2020   ALT 12 03/25/2020   PROT 7.1 03/25/2020   ALBUMIN 5.1 09/25/2019   CALCIUM 9.7 03/25/2020   GFR 60.43 09/25/2019   Lab Results  Component Value Date   CHOL 140 03/25/2020   Lab Results  Component Value Date   HDL 58  03/25/2020   Lab Results  Component Value Date   LDLCALC 66 03/25/2020   Lab Results  Component Value Date   TRIG 76 03/25/2020   Lab Results  Component Value Date   CHOLHDL 2.4 03/25/2020   Lab Results  Component Value Date   HGBA1C 5.6 03/25/2020       Assessment & Plan:   Problem List Items Addressed This Visit    Low back pain    Encouraged moist heat and gentle stretching as tolerated. May try NSAIDs and prescription meds as directed and report if symptoms worsen or seek immediate care. Notes increased radicular symptoms into right leg. Can continue Tylenol to a max of 1000 mg tid. Add Tizanidine 1-4 mg bid prn and follow up with pain management      Relevant Medications   tiZANidine (ZANAFLEX) 2 MG tablet   Hyperglycemia    hgba1c acceptable, minimize simple carbs. Increase exercise as tolerated.       Relevant Orders   Hemoglobin A1c (Completed)   Hypertension    Well controlled, no changes to meds. Encouraged heart healthy diet such as the DASH diet and exercise as tolerated.       Relevant Orders   CBC (Completed)   Comprehensive metabolic panel (Completed)   TSH (Completed)   Urinary hesitancy    He recently was hospitalized with a urinary tract infection and pneumonia was also noted. He feels much better presently and denies  any persistnet concerns. He will report any return of symptoms. He declines COVID and flu shots today      Hyperlipidemia    Encouraged heart healthy diet, increase exercise, avoid trans fats, consider a krill oil cap daily      Relevant Orders   Lipid panel (Completed)      I have discontinued Jhace Mullendore's naproxen and cyclobenzaprine. I am also having him maintain his methadone, multivitamin, fluocinonide gel, lisinopril, tiZANidine, and magic mouthwash.  Meds ordered this encounter  Medications  . tiZANidine (ZANAFLEX) 2 MG tablet    Sig: Take 0.5-2 tablets (1-4 mg total) by mouth at bedtime as needed for muscle  spasms.    Dispense:  40 tablet    Refill:  1  . magic mouthwash SOLN    Sig: Take 5 mLs by mouth 2 (two) times daily.    Dispense:  240 mL    Refill:  1     Penni Homans, MD

## 2020-03-28 NOTE — Assessment & Plan Note (Signed)
He recently was hospitalized with a urinary tract infection and pneumonia was also noted. He feels much better presently and denies any persistnet concerns. He will report any return of symptoms. He declines COVID and flu shots today

## 2020-05-17 ENCOUNTER — Other Ambulatory Visit: Payer: Self-pay | Admitting: Family Medicine

## 2020-05-18 DIAGNOSIS — Z0120 Encounter for dental examination and cleaning without abnormal findings: Secondary | ICD-10-CM | POA: Diagnosis not present

## 2020-08-18 ENCOUNTER — Other Ambulatory Visit: Payer: Self-pay | Admitting: Family Medicine

## 2020-09-22 NOTE — Progress Notes (Signed)
Subjective:   Shaun [name] is a 70 y.o. male who presents for Medicare Annual/Subsequent preventive examination.  Review of Systems     Cardiac Risk Factors include: male gender;advanced age (>74men, >31 women);hypertension;dyslipidemia     Objective:    Today's Vitals   09/23/20 0847 09/23/20 0851  BP: 140/73   Pulse: 83   Resp: 16   Temp: 98 F (36.7 C)   TempSrc: Temporal   SpO2: 97%   Weight: 167 lb 9.6 oz (76 kg)   Height: 6' (1.829 m)   PainSc:  3    Body mass index is 22.73 kg/m.  Advanced Directives 09/23/2020 09/23/2019 01/27/2016 07/29/2015 07/04/2012  Does Patient Have a Medical Advance Directive? Yes Yes - Yes Patient does not have advance directive  Type of Advance Directive [place];Living will [place];Living will [place];Living will [place];Living will -  Does patient want to make changes to medical advance directive? - No - Patient declined No - Patient declined - -  Copy of [place] in Chart? No - copy requested No - copy requested No - copy requested No - copy requested -  Pre-existing out of facility DNR order (yellow form or pink MOST form) - - - - No    Current Medications (verified) Outpatient Encounter Medications as of 09/23/2020  Medication Sig  . fluocinonide gel (LIDEX) 0.05 % APPLY TO TONGUE TWICE DAILY AS NEEDED  . lisinopril (ZESTRIL) 10 MG tablet TAKE ONE TABLET BY MOUTH ONCE DAILY  . magic mouthwash SOLN Take 5 mLs by mouth 2 (two) times daily.  . methadone (DOLOPHINE) 10 MG/5ML solution Take 8 mg by mouth every morning.  . Multiple Vitamin (MULTIVITAMIN) tablet Take 1 tablet by mouth every morning.   Marland Kitchen tiZANidine (ZANAFLEX) 2 MG tablet Take 0.5-2 tablets (1-4 mg total) by mouth at bedtime as needed for muscle spasms.   No facility-administered encounter medications on file as of 09/23/2020.    Allergies (verified) Patient has no  known allergies.   History: Past Medical History:  Diagnosis Date  . Allergic state 01/30/2017  . Arm mass   . Arm skin lesion, left 11/12/2012   And sun damaged skin  . Elevated BP 11/14/2012  . Encounter for Medicare annual wellness exam 01/27/2016  . GERD (gastroesophageal reflux disease) 01/27/2016  . Hepatitis C, chronic ([place])   . History of intravenous drug use in remission   . History of oral lesions 05/14/2013  . History of oral lesions 07/29/2015  . Hypertension 11/14/2012  . Left shoulder pain 12/16/2012  . Methadone use 01/31/2015  . Myalgia and myositis 08/13/2014  . Preventative health care 05/14/2013  . Preventative health care 05/14/2013   Past Surgical History:  Procedure Laterality Date  . KNEE ARTHROSCOPY  1996   ligment repair   Family History  Problem Relation Age of Onset  . Heart disease Father   . Diabetes Father   . Hyperlipidemia Father   . Hypertension Father   . Thyroid disease Sister   . Heart disease Cousin        MI  . Diabetes Cousin   . Obesity Cousin   . Osteoporosis Mother   . Diabetes Paternal Grandfather   . Thyroid disease Maternal Grandmother   . Cancer Neg Hx        negative for colon and prostate   Social History   Socioeconomic History  . Marital status: Married  Spouse name: Not on file  . Number of children: Not on file  . Years of education: Not on file  . Highest education level: Not on file  Occupational History  . Not on file  Tobacco Use  . Smoking status: Never Smoker  . Smokeless tobacco: Never Used  Vaping Use  . Vaping Use: Never used  Substance and Sexual Activity  . Alcohol use: No    Alcohol/week: 0.0 standard drinks  . Drug use: No  . Sexual activity: Yes    Comment: avoids nuts, fruits, lives with wife, makes picture frames, building maintenance  Other Topics Concern  . Not on file  Social History Narrative  . Not on file   Social Determinants of Health   Financial Resource Strain: Low Risk   .  Difficulty of Paying Living Expenses: Not hard at all  Food Insecurity: No Food Insecurity  . Worried About Charity fundraiser in the Last Year: Never true  . Ran Out of Food in the Last Year: Never true  Transportation Needs: No Transportation Needs  . Lack of Transportation (Medical): No  . Lack of Transportation (Non-Medical): No  Physical Activity: Sufficiently Active  . Days of Exercise per Week: 7 days  . Minutes of Exercise per Session: 30 min  Stress: No Stress Concern Present  . Feeling of Stress : Not at all  Social Connections: Moderately Isolated  . Frequency of Communication with Friends and Family: More than three times a week  . Frequency of Social Gatherings with Friends and Family: More than three times a week  . Attends Religious Services: Never  . Active Member of Clubs or Organizations: No  . Attends Archivist Meetings: Never  . Marital Status: Married    Tobacco Counseling Counseling given: Not Answered   Clinical Intake:  Pre-visit preparation completed: Yes  Pain : 0-10 Pain Score: 3  Pain Type: Chronic pain Pain Location: Back Pain Orientation: Lower Pain Onset: More than a month ago Pain Frequency: Constant     Nutritional Status: BMI of 19-24  Normal Nutritional Risks: None Diabetes: No  How often do you need to have someone help you when you read instructions, pamphlets, or other written materials from your doctor or pharmacy?: 1 - Never  Diabetic?No  Interpreter Needed?: No  Information entered by :: Caroleen Hamman LPN   Activities of Daily Living In your present state of health, do you have any difficulty performing the following activities: 09/23/2020  Hearing? N  Vision? N  Difficulty concentrating or making decisions? N  Walking or climbing stairs? N  Dressing or bathing? N  Doing errands, shopping? N  Preparing Food and eating ? N  Using the Toilet? N  In the past six months, have you accidently leaked urine? N   Do you have problems with loss of bowel control? N  Managing your Medications? N  Managing your Finances? N  Housekeeping or managing your Housekeeping? N  Some recent data might be hidden    Patient Care Team: [name], MD as PCP - General (Family Medicine)  Indicate any recent Medical Services you may have received from other than [facility] providers in the past year (date may be approximate).     Assessment:   This is a routine wellness examination for [place].  Hearing/Vision screen  Hearing Screening   125Hz  250Hz  500Hz  1000Hz  2000Hz  3000Hz  4000Hz  6000Hz  8000Hz   Right ear:  Left ear:           Comments: C/o Mild hearing loss  Vision Screening Comments: Reading glasses Last eye exam-02/2020  Dietary issues and exercise activities discussed: Current Exercise Habits: Home exercise routine, Type of exercise: walking (lawncare, gardening), Time (Minutes): 30, Frequency (Times/Week): 7, Weekly Exercise (Minutes/Week): 210, Exercise limited by: None identified  Goals    . Maintain healthy active lifestyle.      Depression Screen PHQ 2/9 Scores 09/23/2020 09/25/2019 09/23/2019 01/30/2017 01/27/2016 01/19/2015  PHQ - 2 Score 0 0 0 0 0 0  PHQ- 9 Score - 2 - - - -    Fall Risk Fall Risk  09/23/2020 09/23/2019 01/30/2017 01/27/2016 01/19/2015  Falls in the past year? 0 0 No No No  Number falls in past yr: 0 0 - - -  Injury with Fall? 0 0 - - -  Follow up Falls prevention discussed Education provided;Falls prevention discussed - - -    FALL RISK PREVENTION PERTAINING TO THE HOME:  Any stairs in or around the home? Yes  If so, are there any without handrails? No  Home free of loose throw rugs in walkways, pet beds, electrical cords, etc? Yes  Adequate lighting in your home to reduce risk of falls? Yes   ASSISTIVE DEVICES UTILIZED TO PREVENT FALLS:  Life alert? No  Use of a cane, walker or w/c? No  Grab bars in the bathroom? Yes  Shower chair or bench in shower? No   Elevated toilet seat or a handicapped toilet? No   TIMED UP AND GO:  Was the test performed? Yes .  Length of time to ambulate 10 feet: 9 sec.   Gait steady and fast without use of assistive device  Cognitive Function:Normal cognitive status assessed by direct observation by this Nurse Health Advisor. No abnormalities found.          Immunizations Immunization History  Administered Date(s) Administered  . Hepatitis B, adult 05/01/2016, 06/01/2016, 10/31/2016  . Influenza Split 05/16/2011, 05/14/2012  . Influenza Whole 03/26/2009  . Influenza, High Dose Seasonal PF 05/01/2016, 08/02/2017, 05/20/2018, 04/14/2019  . Influenza,inj,Quad PF,6+ Mos 05/13/2013, 07/21/2014, 07/29/2015  . Pneumococcal Conjugate-13 07/29/2015  . Pneumococcal Polysaccharide-23 07/20/2008, 01/30/2017  . Td 03/26/2009  . Zoster 05/16/2011    TDAP status: Due, Education has been provided regarding the importance of this vaccine. Advised may receive this vaccine at local pharmacy or Health Dept. Aware to provide a copy of the vaccination record if obtained from local pharmacy or Health Dept. Verbalized acceptance and understanding.  Flu Vaccine status: Due, Education has been provided regarding the importance of this vaccine. Advised may receive this vaccine at local pharmacy or Health Dept. Aware to provide a copy of the vaccination record if obtained from local pharmacy or Health Dept. Verbalized acceptance and understanding.  Pneumococcal vaccine status: Up to date  Covid-19 vaccine status: Information provided on how to obtain vaccines.   Qualifies for Shingles Vaccine? Yes   Zostavax completed Yes   Shingrix Completed?: No.    Education has been provided regarding the importance of this vaccine. Patient has been advised to call insurance company to determine out of pocket expense if they have not yet received this vaccine. Advised may also receive vaccine at local pharmacy or Health Dept. Verbalized  acceptance and understanding.  Screening Tests Health Maintenance  Topic Date Due  . COVID-19 Vaccine (1) Never done  . TETANUS/TDAP  03/27/2019  . COLONOSCOPY (Pts 45-89yrs Insurance coverage will need  to be confirmed)  08/11/2020  . INFLUENZA VACCINE  01/10/2021  . Hepatitis C Screening  Completed  . PNA vac Low Risk Adult  Completed  . HPV VACCINES  Aged Out    Health Maintenance  Health Maintenance Due  Topic Date Due  . COVID-19 Vaccine (1) Never done  . TETANUS/TDAP  03/27/2019  . COLONOSCOPY (Pts 45-41yrs Insurance coverage will need to be confirmed)  08/11/2020    Colorectal cancer screening: Due-Declined today. Patient states he will call back when he is ready to schedule.  Lung Cancer Screening: (Low Dose CT Chest recommended if Age 26-80 years, 30 pack-year currently smoking OR have quit w/in 15years.) does not qualify.    Additional Screening:  Hepatitis C Screening:  Does not qualify.  Vision Screening: Recommended annual ophthalmology exams for early detection of glaucoma and other disorders of the eye. Is the patient up to date with their annual eye exam?  Yes  Who is the provider or what is the name of the office in which the patient attends annual eye exams? Pt unsure of name   Dental Screening: Recommended annual dental exams for proper oral hygiene  Community Resource Referral / Chronic Care Management: CRR required this visit?  No   CCM required this visit?  No      Plan:     I have personally reviewed and noted the following in the patient's chart:   . Medical and social history . Use of alcohol, tobacco or illicit drugs  . Current medications and supplements . Functional ability and status . Nutritional status . Physical activity . Advanced directives . List of other physicians . Hospitalizations, surgeries, and ER visits in previous 12 months . Vitals . Screenings to include cognitive, depression, and falls . Referrals and  appointments  In addition, I have reviewed and discussed with patient certain preventive protocols, quality metrics, and best practice recommendations. A written personalized care plan for preventive services as well as general preventive health recommendations were provided to patient.   Patient to access avs on mychart.  Marta Antu, LPN   [id number]  Nurse Health Advisor  Nurse Notes: None

## 2020-09-23 ENCOUNTER — Other Ambulatory Visit: Payer: Self-pay

## 2020-09-23 ENCOUNTER — Ambulatory Visit: Payer: Medicare Other | Admitting: *Deleted

## 2020-09-23 ENCOUNTER — Ambulatory Visit: Payer: Medicare Other

## 2020-09-23 ENCOUNTER — Encounter: Payer: Self-pay | Admitting: Family Medicine

## 2020-09-23 ENCOUNTER — Ambulatory Visit (INDEPENDENT_AMBULATORY_CARE_PROVIDER_SITE_OTHER): Payer: Medicare Other | Admitting: Family Medicine

## 2020-09-23 VITALS — BP 128/70 | HR 73 | Temp 98.2°F | Resp 16 | Ht 72.0 in | Wt 163.4 lb

## 2020-09-23 VITALS — BP 140/73 | HR 83 | Temp 98.0°F | Resp 16 | Ht 72.0 in | Wt 167.6 lb

## 2020-09-23 DIAGNOSIS — R739 Hyperglycemia, unspecified: Secondary | ICD-10-CM

## 2020-09-23 DIAGNOSIS — I10 Essential (primary) hypertension: Secondary | ICD-10-CM

## 2020-09-23 DIAGNOSIS — S0181XA Laceration without foreign body of other part of head, initial encounter: Secondary | ICD-10-CM | POA: Diagnosis not present

## 2020-09-23 DIAGNOSIS — K1379 Other lesions of oral mucosa: Secondary | ICD-10-CM

## 2020-09-23 DIAGNOSIS — E785 Hyperlipidemia, unspecified: Secondary | ICD-10-CM | POA: Diagnosis not present

## 2020-09-23 DIAGNOSIS — R3911 Hesitancy of micturition: Secondary | ICD-10-CM

## 2020-09-23 DIAGNOSIS — Z23 Encounter for immunization: Secondary | ICD-10-CM | POA: Diagnosis not present

## 2020-09-23 DIAGNOSIS — M545 Low back pain, unspecified: Secondary | ICD-10-CM

## 2020-09-23 DIAGNOSIS — Z0000 Encounter for general adult medical examination without abnormal findings: Secondary | ICD-10-CM

## 2020-09-23 MED ORDER — MAGIC MOUTHWASH: 5.0000 mL | Freq: Two times a day (BID) | ORAL | 1 refills | Status: DC

## 2020-09-23 MED ORDER — TIZANIDINE HCL 2 MG PO TABS: 1.0000 mg | ORAL_TABLET | Freq: Every evening | ORAL | 1 refills | Status: DC | PRN

## 2020-09-23 NOTE — Patient Instructions (Signed)
Shingrix is the new shingles shot, 2 shots over 2-6 months, confirm coverage with insurance and document, then can return here for shots with nurse appt or at pharmacy  

## 2020-09-23 NOTE — Assessment & Plan Note (Signed)
Well controlled, no changes to meds. Encouraged heart healthy diet such as the DASH diet and exercise as tolerated.  °

## 2020-09-23 NOTE — Progress Notes (Signed)
Patient ID: Shaun [name], male    DOB: 1950/09/17  Age: 70 y.o. MRN: [id number]    Subjective:  Subjective  HPI [name] presents for comprehensive physical exam today and follow up on management of chronic concerns. He states that he is feeling alright and there are no major changes regarding his medical history. He reports that he has trouble with his back pain especially when he is under physical activity. He states that the pain is manageable and can handle it at the moment. He states that methadone 10 mg and Zanaflex 2 mg help relieve some of the pain. He states that for physical activity he walks his dog. He denies any chest pain, SOB, fever, abdominal pain, cough, chills, sore throat, dysuria, urinary incontinence, HA, or N/VD. He states that his wife has recently developed shingles, but she is doing better at the moment. He states that he is willing to get his colonoscopy, but needs time to decide when to schedule it. He reports that he had a skin laceration in his middle finger while he was working on his car a couple of days ago. He states that there is a scaly red spot next to his left side of his nose. He reports that he has mouth sores that flare up when ingesting acidic and citric food, but he states that the mouth wash prescribed to him helps with the symptoms. Review of Systems  Constitutional: Negative for chills, fatigue and fever.  HENT: Positive for mouth sores. Negative for congestion, rhinorrhea, sinus pressure, sinus pain and sore throat.   Eyes: Negative for pain.  Respiratory: Negative for cough, shortness of breath and wheezing.   Cardiovascular: Negative for chest pain, palpitations and leg swelling.  Gastrointestinal: Negative for abdominal pain, blood in stool, diarrhea, nausea and vomiting.  Genitourinary: Negative for flank pain, frequency and penile pain.  Musculoskeletal: Positive for back pain (lower).  Neurological: Negative for headaches.     History Past Medical History:  Diagnosis Date  . Allergic state 01/30/2017  . Arm mass   . Arm skin lesion, left 11/12/2012   And sun damaged skin  . Elevated BP 11/14/2012  . Encounter for Medicare annual wellness exam 01/27/2016  . GERD (gastroesophageal reflux disease) 01/27/2016  . Hepatitis C, chronic ([place])   . History of intravenous drug use in remission   . History of oral lesions 05/14/2013  . History of oral lesions 07/29/2015  . Hypertension 11/14/2012  . Left shoulder pain 12/16/2012  . Methadone use 01/31/2015  . Myalgia and myositis 08/13/2014  . Preventative health care 05/14/2013  . Preventative health care 05/14/2013    He has a past surgical history that includes Knee arthroscopy (1996).   His family history includes Diabetes in his cousin, father, and paternal grandfather; Heart disease in his cousin and father; Hyperlipidemia in his father; Hypertension in his father; Obesity in his cousin; Osteoporosis in his mother; Thyroid disease in his maternal grandmother and sister.He reports that he has never smoked. He has never used smokeless tobacco. He reports that he does not drink alcohol and does not use drugs.  Current Outpatient Medications on File Prior to Visit  Medication Sig Dispense Refill  . fluocinonide gel (LIDEX) 0.05 % APPLY TO TONGUE TWICE DAILY AS NEEDED 60 g 0  . lisinopril (ZESTRIL) 10 MG tablet TAKE ONE TABLET BY MOUTH ONCE DAILY 90 tablet 1  . methadone (DOLOPHINE) 10 MG/5ML solution Take 8 mg by mouth every morning.    . Multiple  Vitamin (MULTIVITAMIN) tablet Take 1 tablet by mouth every morning.      No current facility-administered medications on file prior to visit.     Objective:  Objective  Physical Exam Constitutional:      General: He is not in acute distress.    Appearance: Normal appearance. He is not ill-appearing or toxic-appearing.  HENT:     Head: Normocephalic and atraumatic.     Right Ear: Tympanic membrane, ear canal and external ear  normal.     Left Ear: Tympanic membrane, ear canal and external ear normal.     Nose: No congestion or rhinorrhea.  Eyes:     Extraocular Movements: Extraocular movements intact.     Right eye: No nystagmus.     Left eye: No nystagmus.     Pupils: Pupils are equal, round, and reactive to light.  Cardiovascular:     Rate and Rhythm: Normal rate and regular rhythm.     Pulses: Normal pulses.     Heart sounds: Normal heart sounds. No murmur heard.   Pulmonary:     Effort: Pulmonary effort is normal. No respiratory distress.     Breath sounds: Normal breath sounds. No wheezing, rhonchi or rales.  Abdominal:     General: Bowel sounds are normal.     Palpations: Abdomen is soft. There is no mass.     Tenderness: There is no abdominal tenderness. There is no guarding.     Hernia: No hernia is present.  Musculoskeletal:        General: Normal range of motion.     Cervical back: Normal range of motion and neck supple.  Skin:    General: Skin is warm and dry.  Neurological:     Mental Status: He is alert and oriented to person, place, and time.  Psychiatric:        Behavior: Behavior normal.    BP 128/70   Pulse 73   Temp 98.2 F (36.8 C)   Resp 16   Ht 6' (1.829 m)   Wt 163 lb 6.4 oz (74.1 kg)   SpO2 98%   BMI 22.16 kg/m  Wt Readings from Last 3 Encounters:  09/23/20 163 lb 6.4 oz (74.1 kg)  09/23/20 167 lb 9.6 oz (76 kg)  03/25/20 165 lb 6.4 oz (75 kg)     Lab Results  Component Value Date   WBC 3.9 03/25/2020   HGB 14.3 03/25/2020   HCT 42.1 03/25/2020   PLT 151 03/25/2020   GLUCOSE 103 (H) 03/25/2020   CHOL 140 03/25/2020   TRIG 76 03/25/2020   HDL 58 03/25/2020   LDLCALC 66 03/25/2020   ALT 12 03/25/2020   AST 17 03/25/2020   NA 138 03/25/2020   K 4.4 03/25/2020   CL 100 03/25/2020   CREATININE 1.14 03/25/2020   BUN 15 03/25/2020   CO2 31 03/25/2020   TSH 1.10 03/25/2020   PSA 0.74 09/25/2019   INR 1.05 07/04/2012   HGBA1C 5.6 03/25/2020    US  ABDOMEN COMPLETE W/ELASTOGRAPHY  Result Date: 04/25/2016 CLINICAL DATA:  Hepatitis-C diagnosed 25 years ago. 2 prior liver biopsies. EXAM: ULTRASOUND ABDOMEN ULTRASOUND HEPATIC ELASTOGRAPHY TECHNIQUE: Sonography of the upper abdomen was performed. In addition, ultrasound elastography evaluation of the liver was performed. A region of interest was placed within the right lobe of the liver. Following application of a compressive sonographic pulse, shear waves were detected in the adjacent hepatic tissue and the shear wave velocity was calculated. Multiple assessments  were performed at the selected site. Median shear wave velocity is correlated to a Metavir fibrosis score. COMPARISON:  05/20/2012 abdominal ultrasound.  MRI of 04/16/2010. FINDINGS: ULTRASOUND ABDOMEN Gallbladder: No gallstones or wall thickening visualized. No sonographic Murphy sign noted by sonographer. Common bile duct: Diameter: Chronically dilated. Example at 13 mm today, versus 14 mm on the prior MRI of 04/16/2010. No evidence of obstructive stone. Liver: Right hepatic lobe 1 cm hyperechoic lesion is similar in appearance in size to on the prior exam, most consistent with a hemangioma. IVC: No abnormality visualized. Pancreas: Visualized portion unremarkable. Spleen: Size and appearance within normal limits. Right Kidney: Length: 10.1 cm. Echogenicity within normal limits. No mass or hydronephrosis visualized. Left Kidney: Length: 11.0 cm. Echogenicity within normal limits. No mass or hydronephrosis visualized. Abdominal aorta: No aneurysm visualized. Other findings: No ascites. ULTRASOUND HEPATIC ELASTOGRAPHY Device: Siemens Helix VTQ Patient position: Supine Transducer 6C1 Number of measurements: 10 Hepatic segment:  8 Median velocity:   1.33  m/sec IQR: 0.17 IQR/Median velocity ratio: 0.13 Corresponding Metavir fibrosis score:  F2 + some F3 Risk of fibrosis: Moderate Limitations of exam: None Pertinent findings noted on other imaging exams:   None Please note that abnormal shear wave velocities may also be identified in clinical settings other than with hepatic fibrosis, such as: acute hepatitis, elevated right heart and central venous pressures including use of beta blockers, veno-occlusive disease (Budd-Chiari), infiltrative processes such as mastocytosis/amyloidosis/infiltrative tumor, extrahepatic cholestasis, in the post-prandial state, and liver transplantation. Correlation with patient history, laboratory data, and clinical condition recommended. IMPRESSION: ULTRASOUND ABDOMEN: 1. Right hepatic lobe hemangioma.  No suspicious liver lesion. 2. Chronic common duct dilatation, likely within normal variation. ULTRASOUND HEPATIC ELASTOGRAPHY: Median hepatic shear wave velocity is calculated at 1.33 m/sec. Corresponding Metavir fibrosis score is  F2 + some F3. Risk of fibrosis is Moderate. Follow-up: Additional testing appropriate Electronically Signed   By: Abigail Miyamoto M.D.   On: 04/25/2016 17:04     Assessment & Plan:  Plan    Meds ordered this encounter  Medications  . tiZANidine (ZANAFLEX) 2 MG tablet    Sig: Take 0.5-2 tablets (1-4 mg total) by mouth at bedtime as needed for muscle spasms.    Dispense:  40 tablet    Refill:  1  . magic mouthwash SOLN    Sig: Take 5 mLs by mouth 2 (two) times daily.    Dispense:  240 mL    Refill:  1    Problem List Items Addressed This Visit    Low back pain    Encouraged moist heat and gentle stretching as tolerated. May try NSAIDs and prescription meds as directed and report if symptoms worsen or seek immediate care. Given refill on Tizanidine to use prn when his back flares up.       Relevant Medications   tiZANidine (ZANAFLEX) 2 MG tablet   Hyperglycemia    hgba1c acceptable, minimize simple carbs. Increase exercise as tolerated. Continue current meds      Relevant Orders   Hemoglobin A1c   Hypertension    Well controlled, no changes to meds. Encouraged heart healthy diet such  as the DASH diet and exercise as tolerated.       Relevant Orders   CBC with Differential/Platelet   Comprehensive metabolic panel   TSH   Preventative health care    Patient encouraged to maintain heart healthy diet, regular exercise, adequate sleep. Consider daily probiotics. Take medications as prescribed. Labs ordered and reviewed. He  will consider a colonoscopy in the near future. No bowel changes      Urinary hesitancy - Primary   Relevant Orders   PSA   Hyperlipidemia    Encouraged heart healthy diet, increase exercise, avoid trans fats, consider a krill oil cap daily      Relevant Orders   Lipid panel   Recurrent oral ulcers    They tend to recur when he has eaten acidic foods are always on the right side and the Magic Mouthwash is very helpful. Refill on the Magic Mouthwash given       Other Visit Diagnoses    Chin laceration, initial encounter       Relevant Orders   Tdap vaccine greater than or equal to 7yo IM (Completed)     Colonoscopy: Last completed on 08/12/2010, results were normal, repeat in 10 years.  Follow-up: No follow-ups on file.   I,David Hanna,acting as a scribe for Penni Homans, MD.,have documented all relevant documentation on the behalf of Penni Homans, MD,as directed by  Penni Homans, MD while in the presence of Penni Homans, MD.  I, [name], MD personally performed the services described in this documentation. All medical record entries made by the scribe were at my direction and in my presence. I have reviewed the chart and agree that the record reflects my personal performance and is accurate and complete

## 2020-09-23 NOTE — Assessment & Plan Note (Addendum)
Patient encouraged to maintain heart healthy diet, regular exercise, adequate sleep. Consider daily probiotics. Take medications as prescribed. Labs ordered and reviewed. He will consider a colonoscopy in the near future. No bowel changes

## 2020-09-23 NOTE — Assessment & Plan Note (Signed)
Encouraged heart healthy diet, increase exercise, avoid trans fats, consider a krill oil cap daily 

## 2020-09-23 NOTE — Assessment & Plan Note (Signed)
hgba1c acceptable, minimize simple carbs. Increase exercise as tolerated. Continue current meds 

## 2020-09-23 NOTE — Patient Instructions (Signed)
Shaun [name] , Thank you for taking time to come for your Medicare Wellness Visit. I appreciate your ongoing commitment to your health goals. Please review the following plan we discussed and let me know if I can assist you in the future.   Screening recommendations/referrals: Colonoscopy: Due-Declined today. Please call the office when you are ready to schedule. Recommended yearly ophthalmology/optometry visit for glaucoma screening and checkup Recommended yearly dental visit for hygiene and checkup  Vaccinations: Influenza vaccine: Due-02/2021 Pneumococcal vaccine: Completed vaccines Tdap vaccine: Discuss with pharmacy Shingles vaccine: Discuss with pharmacy   Covid-19: Discuss with pharmacy if you decide you want the vaccines  Advanced directives: Please bring a copy for your chart  Conditions/risks identified: See problem list  Next appointment: Follow up in one year for your annual wellness visit. 09/29/2021 @ 9:00  Preventive Care 65 Years and Older, Male Preventive care refers to lifestyle choices and visits with your health care provider that can promote health and wellness. What does preventive care include?  A yearly physical exam. This is also called an annual well check.  Dental exams once or twice a year.  Routine eye exams. Ask your health care provider how often you should have your eyes checked.  Personal lifestyle choices, including:  Daily care of your teeth and gums.  Regular physical activity.  Eating a healthy diet.  Avoiding tobacco and drug use.  Limiting alcohol use.  Practicing safe sex.  Taking low doses of aspirin every day.  Taking vitamin and mineral supplements as recommended by your health care provider. What happens during an annual well check? The services and screenings done by your health care provider during your annual well check will depend on your age, overall health, lifestyle risk factors, and family history of  disease. Counseling  Your health care provider may ask you questions about your:  Alcohol use.  Tobacco use.  Drug use.  Emotional well-being.  Home and relationship well-being.  Sexual activity.  Eating habits.  History of falls.  Memory and ability to understand (cognition).  Work and work Statistician. Screening  You may have the following tests or measurements:  Height, weight, and BMI.  Blood pressure.  Lipid and cholesterol levels. These may be checked every 5 years, or more frequently if you are over 56 years old.  Skin check.  Lung cancer screening. You may have this screening every year starting at age 45 if you have a 30-pack-year history of smoking and currently smoke or have quit within the past 15 years.  Fecal occult blood test (FOBT) of the stool. You may have this test every year starting at age 66.  Flexible sigmoidoscopy or colonoscopy. You may have a sigmoidoscopy every 5 years or a colonoscopy every 10 years starting at age 68.  Prostate cancer screening. Recommendations will vary depending on your family history and other risks.  Hepatitis C blood test.  Hepatitis B blood test.  Sexually transmitted disease (STD) testing.  Diabetes screening. This is done by checking your blood sugar (glucose) after you have not eaten for a while (fasting). You may have this done every 1-3 years.  Abdominal aortic aneurysm (AAA) screening. You may need this if you are a current or former smoker.  Osteoporosis. You may be screened starting at age 31 if you are at high risk. Talk with your health care provider about your test results, treatment options, and if necessary, the need for more tests. Vaccines  Your health care provider may recommend certain  vaccines, such as:  Influenza vaccine. This is recommended every year.  Tetanus, diphtheria, and acellular pertussis (Tdap, Td) vaccine. You may need a Td booster every 10 years.  Zoster vaccine. You may  need this after age 63.  Pneumococcal 13-valent conjugate (PCV13) vaccine. One dose is recommended after age 25.  Pneumococcal polysaccharide (PPSV23) vaccine. One dose is recommended after age 22. Talk to your health care provider about which screenings and vaccines you need and how often you need them. This information is not intended to replace advice given to you by your health care provider. Make sure you discuss any questions you have with your health care provider. Document Released: 06/25/2015 Document Revised: 02/16/2016 Document Reviewed: 03/30/2015 Elsevier Interactive Patient Education  2017 [employer] Prevention in the Home Falls can cause injuries. They can happen to people of all ages. There are many things you can do to make your home safe and to help prevent falls. What can I do on the outside of my home?  Regularly fix the edges of walkways and driveways and fix any cracks.  Remove anything that might make you trip as you walk through a door, such as a raised step or threshold.  Trim any bushes or trees on the path to your home.  Use bright outdoor lighting.  Clear any walking paths of anything that might make someone trip, such as rocks or tools.  Regularly check to see if handrails are loose or broken. Make sure that both sides of any steps have handrails.  Any raised decks and porches should have guardrails on the edges.  Have any leaves, snow, or ice cleared regularly.  Use sand or salt on walking paths during winter.  Clean up any spills in your garage right away. This includes oil or grease spills. What can I do in the bathroom?  Use night lights.  Install grab bars by the toilet and in the tub and shower. Do not use towel bars as grab bars.  Use non-skid mats or decals in the tub or shower.  If you need to sit down in the shower, use a plastic, non-slip stool.  Keep the floor dry. Clean up any water that spills on the floor as soon as it  happens.  Remove soap buildup in the tub or shower regularly.  Attach bath mats securely with double-sided non-slip rug tape.  Do not have throw rugs and other things on the floor that can make you trip. What can I do in the bedroom?  Use night lights.  Make sure that you have a light by your bed that is easy to reach.  Do not use any sheets or blankets that are too big for your bed. They should not hang down onto the floor.  Have a firm chair that has side arms. You can use this for support while you get dressed.  Do not have throw rugs and other things on the floor that can make you trip. What can I do in the kitchen?  Clean up any spills right away.  Avoid walking on wet floors.  Keep items that you use a lot in easy-to-reach places.  If you need to reach something above you, use a strong step stool that has a grab bar.  Keep electrical cords out of the way.  Do not use floor polish or wax that makes floors slippery. If you must use wax, use non-skid floor wax.  Do not have throw rugs and other things  on the floor that can make you trip. What can I do with my stairs?  Do not leave any items on the stairs.  Make sure that there are handrails on both sides of the stairs and use them. Fix handrails that are broken or loose. Make sure that handrails are as long as the stairways.  Check any carpeting to make sure that it is firmly attached to the stairs. Fix any carpet that is loose or worn.  Avoid having throw rugs at the top or bottom of the stairs. If you do have throw rugs, attach them to the floor with carpet tape.  Make sure that you have a light switch at the top of the stairs and the bottom of the stairs. If you do not have them, ask someone to add them for you. What else can I do to help prevent falls?  Wear shoes that:  Do not have high heels.  Have rubber bottoms.  Are comfortable and fit you well.  Are closed at the toe. Do not wear sandals.  If you  use a stepladder:  Make sure that it is fully opened. Do not climb a closed stepladder.  Make sure that both sides of the stepladder are locked into place.  Ask someone to hold it for you, if possible.  Clearly mark and make sure that you can see:  Any grab bars or handrails.  First and last steps.  Where the edge of each step is.  Use tools that help you move around (mobility aids) if they are needed. These include:  Canes.  Walkers.  Scooters.  Crutches.  Turn on the lights when you go into a dark area. Replace any light bulbs as soon as they burn out.  Set up your furniture so you have a clear path. Avoid moving your furniture around.  If any of your floors are uneven, fix them.  If there are any pets around you, be aware of where they are.  Review your medicines with your doctor. Some medicines can make you feel dizzy. This can increase your chance of falling. Ask your doctor what other things that you can do to help prevent falls. This information is not intended to replace advice given to you by your health care provider. Make sure you discuss any questions you have with your health care provider. Document Released: 03/25/2009 Document Revised: 11/04/2015 Document Reviewed: 07/03/2014 Elsevier Interactive Patient Education  2017 [employer].

## 2020-09-24 DIAGNOSIS — K1379 Other lesions of oral mucosa: Secondary | ICD-10-CM | POA: Insufficient documentation

## 2020-09-24 NOTE — Assessment & Plan Note (Signed)
They tend to recur when he has eaten acidic foods are always on the right side and the Magic Mouthwash is very helpful. Refill on the Magic Mouthwash given

## 2020-09-24 NOTE — Assessment & Plan Note (Signed)
Encouraged moist heat and gentle stretching as tolerated. May try NSAIDs and prescription meds as directed and report if symptoms worsen or seek immediate care. Given refill on Tizanidine to use prn when his back flares up.

## 2020-09-27 ENCOUNTER — Other Ambulatory Visit (INDEPENDENT_AMBULATORY_CARE_PROVIDER_SITE_OTHER): Payer: Medicare Other

## 2020-09-27 ENCOUNTER — Other Ambulatory Visit: Payer: Self-pay

## 2020-09-27 DIAGNOSIS — R3911 Hesitancy of micturition: Secondary | ICD-10-CM

## 2020-09-27 DIAGNOSIS — E785 Hyperlipidemia, unspecified: Secondary | ICD-10-CM

## 2020-09-27 DIAGNOSIS — R739 Hyperglycemia, unspecified: Secondary | ICD-10-CM

## 2020-09-27 DIAGNOSIS — I10 Essential (primary) hypertension: Secondary | ICD-10-CM

## 2020-09-27 LAB — CBC WITH DIFFERENTIAL/PLATELET
Basophils Absolute: 0 10*3/uL (ref 0.0–0.1)
Basophils Relative: 0.6 % (ref 0.0–3.0)
Eosinophils Absolute: 0 10*3/uL (ref 0.0–0.7)
Eosinophils Relative: 1.2 % (ref 0.0–5.0)
HCT: 42.6 % (ref 39.0–52.0)
Hemoglobin: 14.1 g/dL (ref 13.0–17.0)
Lymphocytes Relative: 25.3 % (ref 12.0–46.0)
Lymphs Abs: 0.9 10*3/uL (ref 0.7–4.0)
MCHC: 33.2 g/dL (ref 30.0–36.0)
MCV: 92 fl (ref 78.0–100.0)
Monocytes Absolute: 0.3 10*3/uL (ref 0.1–1.0)
Monocytes Relative: 7.7 % (ref 3.0–12.0)
Neutro Abs: 2.4 10*3/uL (ref 1.4–7.7)
Neutrophils Relative %: 65.2 % (ref 43.0–77.0)
Platelets: 155 10*3/uL (ref 150.0–400.0)
RBC: 4.63 Mil/uL (ref 4.22–5.81)
RDW: 12.8 % (ref 11.5–15.5)
WBC: 3.7 10*3/uL — ABNORMAL LOW (ref 4.0–10.5)

## 2020-09-27 LAB — LIPID PANEL
Cholesterol: 123 mg/dL (ref 0–200)
HDL: 54.1 mg/dL (ref >39.00)
LDL Cholesterol: 54 mg/dL (ref 0–99)
NonHDL: 68.6
Total CHOL/HDL Ratio: 2
Triglycerides: 74 mg/dL (ref 0.0–149.0)
VLDL: 14.8 mg/dL (ref 0.0–40.0)

## 2020-09-27 LAB — COMPREHENSIVE METABOLIC PANEL
ALT: 14 U/L (ref 0–53)
AST: 17 U/L (ref 0–37)
Albumin: 4.6 g/dL (ref 3.5–5.2)
Alkaline Phosphatase: 63 U/L (ref 39–117)
BUN: 14 mg/dL (ref 6–23)
CO2: 31 mEq/L (ref 19–32)
Calcium: 9.5 mg/dL (ref 8.4–10.5)
Chloride: 99 mEq/L (ref 96–112)
Creatinine, Ser: 1.22 mg/dL (ref 0.40–1.50)
GFR: 59.83 mL/min — ABNORMAL LOW (ref >60.00)
Glucose, Bld: 102 mg/dL — ABNORMAL HIGH (ref 70–99)
Potassium: 4.6 mEq/L (ref 3.5–5.1)
Sodium: 138 mEq/L (ref 135–145)
Total Bilirubin: 0.7 mg/dL (ref 0.2–1.2)
Total Protein: 6.9 g/dL (ref 6.0–8.3)

## 2020-09-27 LAB — TSH: TSH: 1.19 u[IU]/mL (ref 0.35–4.50)

## 2020-09-27 LAB — PSA: PSA: 0.91 ng/mL (ref 0.10–4.00)

## 2020-09-27 LAB — HEMOGLOBIN A1C: Hgb A1c MFr Bld: 5.9 % (ref 4.6–6.5)

## 2020-09-27 NOTE — Progress Notes (Signed)
Redraw, no charge today.

## 2021-01-24 ENCOUNTER — Other Ambulatory Visit: Payer: Self-pay | Admitting: Family Medicine

## 2021-01-24 ENCOUNTER — Telehealth: Payer: Self-pay

## 2021-01-24 MED ORDER — TIZANIDINE HCL 2 MG PO TABS: 1.0000 mg | ORAL_TABLET | Freq: Every evening | ORAL | 1 refills | Status: DC | PRN

## 2021-01-24 NOTE — Telephone Encounter (Signed)
Pt says he has re-injured his back and asks for a refill of Tizanidine '2mg'$ .

## 2021-01-25 NOTE — Telephone Encounter (Signed)
Pt aware.

## 2021-03-16 ENCOUNTER — Encounter: Payer: Self-pay | Admitting: Gastroenterology

## 2021-03-31 ENCOUNTER — Ambulatory Visit (INDEPENDENT_AMBULATORY_CARE_PROVIDER_SITE_OTHER): Payer: Medicare Other | Admitting: Family Medicine

## 2021-03-31 ENCOUNTER — Other Ambulatory Visit: Payer: Self-pay

## 2021-03-31 ENCOUNTER — Encounter: Payer: Self-pay | Admitting: Family Medicine

## 2021-03-31 VITALS — BP 130/72 | HR 77 | Temp 98.1°F | Resp 16 | Wt 164.2 lb

## 2021-03-31 DIAGNOSIS — R739 Hyperglycemia, unspecified: Secondary | ICD-10-CM | POA: Diagnosis not present

## 2021-03-31 DIAGNOSIS — E785 Hyperlipidemia, unspecified: Secondary | ICD-10-CM

## 2021-03-31 DIAGNOSIS — I10 Essential (primary) hypertension: Secondary | ICD-10-CM | POA: Diagnosis not present

## 2021-03-31 DIAGNOSIS — K1379 Other lesions of oral mucosa: Secondary | ICD-10-CM

## 2021-03-31 DIAGNOSIS — F1190 Opioid use, unspecified, uncomplicated: Secondary | ICD-10-CM | POA: Diagnosis not present

## 2021-03-31 DIAGNOSIS — M545 Low back pain, unspecified: Secondary | ICD-10-CM

## 2021-03-31 LAB — CBC WITH DIFFERENTIAL/PLATELET
Basophils Absolute: 0 10*3/uL (ref 0.0–0.1)
Basophils Relative: 0.8 % (ref 0.0–3.0)
Eosinophils Absolute: 0 10*3/uL (ref 0.0–0.7)
Eosinophils Relative: 0.9 % (ref 0.0–5.0)
HCT: 42.7 % (ref 39.0–52.0)
Hemoglobin: 14.2 g/dL (ref 13.0–17.0)
Lymphocytes Relative: 22.6 % (ref 12.0–46.0)
Lymphs Abs: 0.9 10*3/uL (ref 0.7–4.0)
MCHC: 33.2 g/dL (ref 30.0–36.0)
MCV: 91.4 fl (ref 78.0–100.0)
Monocytes Absolute: 0.3 10*3/uL (ref 0.1–1.0)
Monocytes Relative: 7.7 % (ref 3.0–12.0)
Neutro Abs: 2.6 10*3/uL (ref 1.4–7.7)
Neutrophils Relative %: 68 % (ref 43.0–77.0)
Platelets: 150 10*3/uL (ref 150.0–400.0)
RBC: 4.67 Mil/uL (ref 4.22–5.81)
RDW: 12.9 % (ref 11.5–15.5)
WBC: 3.8 10*3/uL — ABNORMAL LOW (ref 4.0–10.5)

## 2021-03-31 LAB — HEMOGLOBIN A1C: Hgb A1c MFr Bld: 5.8 % (ref 4.6–6.5)

## 2021-03-31 LAB — COMPREHENSIVE METABOLIC PANEL
ALT: 15 U/L (ref 0–53)
AST: 19 U/L (ref 0–37)
Albumin: 4.9 g/dL (ref 3.5–5.2)
Alkaline Phosphatase: 58 U/L (ref 39–117)
BUN: 17 mg/dL (ref 6–23)
CO2: 32 mEq/L (ref 19–32)
Calcium: 9.9 mg/dL (ref 8.4–10.5)
Chloride: 98 mEq/L (ref 96–112)
Creatinine, Ser: 1.18 mg/dL (ref 0.40–1.50)
GFR: 62.05 mL/min (ref >60.00)
Glucose, Bld: 116 mg/dL — ABNORMAL HIGH (ref 70–99)
Potassium: 4.7 mEq/L (ref 3.5–5.1)
Sodium: 138 mEq/L (ref 135–145)
Total Bilirubin: 0.8 mg/dL (ref 0.2–1.2)
Total Protein: 7.1 g/dL (ref 6.0–8.3)

## 2021-03-31 LAB — TSH: TSH: 1.04 u[IU]/mL (ref 0.35–5.50)

## 2021-03-31 MED ORDER — MAGIC MOUTHWASH: 5.0000 mL | Freq: Two times a day (BID) | ORAL | 1 refills | Status: DC

## 2021-03-31 NOTE — Patient Instructions (Addendum)
Shingrix is the new shingles shot, 2 shots over 2-6 months, confirm coverage with insurance and document, then can return here for shots with nurse appt or at pharmacy  New bivalent covid booster available at the pharmacy downstairs Mon-Fri, 9am-3pm or your preferred pharmacy. Walk-ins available as well.  Molnupiravir/Paxlovid is the new COVID medication we can give you if you get COVID so make sure you test if you have symptoms because we have to treat by day 5 of symptoms for it to be effective. If you are positive let us know so we can treat. If a home test is negative and your symptoms are persistent get a PCR test. Can check testing locations at [email or web address] If you are positive we will make an appointment with [place] and we will send in molnupiravir/paxlovid if you would like it. Check with your pharmacy before we meet to confirm they have it in stock, if they do not then we can get the prescription at the [facility].

## 2021-03-31 NOTE — Progress Notes (Signed)
Patient ID: Shaun [name], male    DOB: 28-Dec-1950  Age: 70 y.o. MRN: [id number]    Subjective:   Chief Complaint  Patient presents with   Follow-up   Subjective   HPI Shaun [name] presents for office visit today for follow up on htn and low back pain. He reports that his back pain is still giving him trouble. It flares up sometimes when doing physically demanding tasks, but also gets better so it's fluctuating. He still takes Dolophine 8 mg daily and takes about half a tablet of tizanidine by bedtime. He reports that his BP readings range is 419-622 systolic and low 29N-LGX 21J diastolic. Denies CP/palp/SOB/HA/congestion/fevers/GI or GU c/o. Taking meds as prescribed.   Review of Systems  Constitutional:  Negative for chills, fatigue and fever.  HENT:  Negative for congestion, rhinorrhea, sinus pressure, sinus pain and sore throat.   Eyes:  Negative for pain.  Respiratory:  Negative for cough and shortness of breath.   Cardiovascular:  Negative for chest pain, palpitations and leg swelling.  Gastrointestinal:  Negative for abdominal pain, blood in stool, diarrhea, nausea and vomiting.  Genitourinary:  Negative for flank pain, frequency and penile pain.  Musculoskeletal:  Positive for back pain.  Neurological:  Negative for headaches.   History Past Medical History:  Diagnosis Date   Allergic state 01/30/2017   Arm mass    Arm skin lesion, left 11/12/2012   And sun damaged skin   Elevated BP 11/14/2012   Encounter for Medicare annual wellness exam 01/27/2016   GERD (gastroesophageal reflux disease) 01/27/2016   Hepatitis C, chronic (HCC)    History of intravenous drug use in remission    History of oral lesions 05/14/2013   History of oral lesions 07/29/2015   Hypertension 11/14/2012   Left shoulder pain 12/16/2012   Methadone use 01/31/2015   Myalgia and myositis 08/13/2014   Preventative health care 05/14/2013   Preventative health care 05/14/2013    He has a past surgical  history that includes Knee arthroscopy (1996).   His family history includes Diabetes in his cousin, father, and paternal grandfather; Heart disease in his cousin and father; Hyperlipidemia in his father; Hypertension in his father; Obesity in his cousin; Osteoporosis in his mother; Thyroid disease in his maternal grandmother and sister.He reports that he has never smoked. He has never used smokeless tobacco. He reports that he does not drink alcohol and does not use drugs.  Current Outpatient Medications on File Prior to Visit  Medication Sig Dispense Refill   fluocinonide gel (LIDEX) 0.05 % APPLY TO TONGUE TWICE DAILY AS NEEDED 60 g 0   lisinopril (ZESTRIL) 10 MG tablet TAKE ONE TABLET BY MOUTH ONCE DAILY 90 tablet 1   methadone (DOLOPHINE) 10 MG/5ML solution Take 8 mg by mouth every morning.     Multiple Vitamin (MULTIVITAMIN) tablet Take 1 tablet by mouth every morning.      tiZANidine (ZANAFLEX) 2 MG tablet Take 0.5-2 tablets (1-4 mg total) by mouth at bedtime as needed for muscle spasms. 40 tablet 1   No current facility-administered medications on file prior to visit.     Objective:  Objective  Physical Exam Constitutional:      General: He is not in acute distress.    Appearance: Normal appearance. He is not ill-appearing or toxic-appearing.  HENT:     Head: Normocephalic and atraumatic.     Right Ear: Tympanic membrane, ear canal and external ear normal.     Left Ear: Tympanic  membrane, ear canal and external ear normal.     Nose: No congestion or rhinorrhea.  Eyes:     Extraocular Movements: Extraocular movements intact.     Pupils: Pupils are equal, round, and reactive to light.  Cardiovascular:     Rate and Rhythm: Normal rate and regular rhythm.     Pulses: Normal pulses.     Heart sounds: Normal heart sounds. No murmur heard. Pulmonary:     Effort: Pulmonary effort is normal. No respiratory distress.     Breath sounds: Normal breath sounds. No wheezing, rhonchi or  rales.  Abdominal:     General: Bowel sounds are normal.     Palpations: Abdomen is soft. There is no mass.     Tenderness: There is no abdominal tenderness. There is no guarding.     Hernia: No hernia is present.  Musculoskeletal:        General: Normal range of motion.     Cervical back: Normal range of motion and neck supple.  Skin:    General: Skin is warm and dry.  Neurological:     Mental Status: He is alert and oriented to person, place, and time.  Psychiatric:        Behavior: Behavior normal.   BP 130/72   Pulse 77   Temp 98.1 F (36.7 C)   Resp 16   Wt 164 lb 3.2 oz (74.5 kg)   SpO2 99%   BMI 22.27 kg/m  Wt Readings from Last 3 Encounters:  03/31/21 164 lb 3.2 oz (74.5 kg)  09/23/20 163 lb 6.4 oz (74.1 kg)  09/23/20 167 lb 9.6 oz (76 kg)     Lab Results  Component Value Date   WBC 3.8 (L) 03/31/2021   HGB 14.2 03/31/2021   HCT 42.7 03/31/2021   PLT 150.0 03/31/2021   GLUCOSE 116 (H) 03/31/2021   CHOL 123 09/27/2020   TRIG 74.0 09/27/2020   HDL 54.10 09/27/2020   LDLCALC 54 09/27/2020   ALT 15 03/31/2021   AST 19 03/31/2021   NA 138 03/31/2021   K 4.7 03/31/2021   CL 98 03/31/2021   CREATININE 1.18 03/31/2021   BUN 17 03/31/2021   CO2 32 03/31/2021   TSH 1.04 03/31/2021   PSA 0.91 09/27/2020   INR 1.05 07/04/2012   HGBA1C 5.8 03/31/2021    US ABDOMEN COMPLETE W/ELASTOGRAPHY  Result Date: 04/25/2016 CLINICAL DATA:  Hepatitis-C diagnosed 25 years ago. 2 prior liver biopsies. EXAM: ULTRASOUND ABDOMEN ULTRASOUND HEPATIC ELASTOGRAPHY TECHNIQUE: Sonography of the upper abdomen was performed. In addition, ultrasound elastography evaluation of the liver was performed. A region of interest was placed within the right lobe of the liver. Following application of a compressive sonographic pulse, shear waves were detected in the adjacent hepatic tissue and the shear wave velocity was calculated. Multiple assessments were performed at the selected site. Median  shear wave velocity is correlated to a Metavir fibrosis score. COMPARISON:  05/20/2012 abdominal ultrasound.  MRI of 04/16/2010. FINDINGS: ULTRASOUND ABDOMEN Gallbladder: No gallstones or wall thickening visualized. No sonographic Murphy sign noted by sonographer. Common bile duct: Diameter: Chronically dilated. Example at 13 mm today, versus 14 mm on the prior MRI of 04/16/2010. No evidence of obstructive stone. Liver: Right hepatic lobe 1 cm hyperechoic lesion is similar in appearance in size to on the prior exam, most consistent with a hemangioma. IVC: No abnormality visualized. Pancreas: Visualized portion unremarkable. Spleen: Size and appearance within normal limits. Right Kidney: Length: 10.1 cm. Echogenicity within  normal limits. No mass or hydronephrosis visualized. Left Kidney: Length: 11.0 cm. Echogenicity within normal limits. No mass or hydronephrosis visualized. Abdominal aorta: No aneurysm visualized. Other findings: No ascites. ULTRASOUND HEPATIC ELASTOGRAPHY Device: Siemens Helix VTQ Patient position: Supine Transducer 6C1 Number of measurements: 10 Hepatic segment:  8 Median velocity:   1.33  m/sec IQR: 0.17 IQR/Median velocity ratio: 0.13 Corresponding Metavir fibrosis score:  F2 + some F3 Risk of fibrosis: Moderate Limitations of exam: None Pertinent findings noted on other imaging exams:  None Please note that abnormal shear wave velocities may also be identified in clinical settings other than with hepatic fibrosis, such as: acute hepatitis, elevated right heart and central venous pressures including use of beta blockers, veno-occlusive disease (Budd-Chiari), infiltrative processes such as mastocytosis/amyloidosis/infiltrative tumor, extrahepatic cholestasis, in the post-prandial state, and liver transplantation. Correlation with patient history, laboratory data, and clinical condition recommended. IMPRESSION: ULTRASOUND ABDOMEN: 1. Right hepatic lobe hemangioma.  No suspicious liver lesion. 2.  Chronic common duct dilatation, likely within normal variation. ULTRASOUND HEPATIC ELASTOGRAPHY: Median hepatic shear wave velocity is calculated at 1.33 m/sec. Corresponding Metavir fibrosis score is  F2 + some F3. Risk of fibrosis is Moderate. Follow-up: Additional testing appropriate Electronically Signed   By: Abigail Miyamoto M.D.   On: 04/25/2016 17:04     Assessment & Plan:  Plan    Meds ordered this encounter  Medications   magic mouthwash SOLN    Sig: Take 5 mLs by mouth 2 (two) times daily.    Dispense:  240 mL    Refill:  1     Problem List Items Addressed This Visit     Low back pain    Encouraged moist heat and gentle stretching as tolerated. May try NSAIDs and prescription meds as directed and report if symptoms worsen or seek immediate care. May continue Zanaflex prn      Hyperglycemia - Primary    hgba1c acceptable, minimize simple carbs. Increase exercise as tolerated. Cont      Relevant Orders   Hemoglobin A1c (Completed)   Hypertension    Well controlled, no changes to meds. Encouraged heart healthy diet such as the DASH diet and exercise as tolerated.       Relevant Orders   CBC with Differential/Platelet (Completed)   Comprehensive metabolic panel (Completed)   TSH (Completed)   TSH   Methadone use    He continues to use a small stable amount and is doing well      Hyperlipidemia    Encourage heart healthy diet such as MIND or DASH diet, increase exercise, avoid trans fats, simple carbohydrates and processed foods, consider a krill or fish or flaxseed oil cap daily.       Relevant Orders   CBC with Differential/Platelet (Completed)   Comprehensive metabolic panel (Completed)   TSH (Completed)   Recurrent oral ulcers    Uses Magic Mouthwash sparingly, prn. Given refill today       Follow-up: Return in about 6 months (around 09/29/2021) for annual cpe.  I, Suezanne Jacquet, acting as a scribe for Penni Homans, MD, have documented all relevent  documentation on behalf of Penni Homans, MD, as directed by Penni Homans, MD while in the presence of Penni Homans, MD. DO:04/01/21.  I, [name], MD personally performed the services described in this documentation. All medical record entries made by the scribe were at my direction and in my presence. I have reviewed the chart and agree that the record reflects my  personal performance and is accurate and complete

## 2021-04-01 NOTE — Assessment & Plan Note (Signed)
Uses Magic Mouthwash sparingly, prn. Given refill today

## 2021-04-01 NOTE — Assessment & Plan Note (Signed)
Well controlled, no changes to meds. Encouraged heart healthy diet such as the DASH diet and exercise as tolerated.  °

## 2021-04-01 NOTE — Assessment & Plan Note (Signed)
hgba1c acceptable, minimize simple carbs. Increase exercise as tolerated. Cont

## 2021-04-01 NOTE — Assessment & Plan Note (Signed)
Encouraged moist heat and gentle stretching as tolerated. May try NSAIDs and prescription meds as directed and report if symptoms worsen or seek immediate care. May continue Zanaflex prn

## 2021-04-01 NOTE — Assessment & Plan Note (Signed)
Encourage heart healthy diet such as MIND or DASH diet, increase exercise, avoid trans fats, simple carbohydrates and processed foods, consider a krill or fish or flaxseed oil cap daily.  °

## 2021-04-01 NOTE — Assessment & Plan Note (Signed)
He continues to use a small stable amount and is doing well

## 2021-05-19 ENCOUNTER — Other Ambulatory Visit: Payer: Self-pay | Admitting: Family Medicine

## 2021-06-01 ENCOUNTER — Other Ambulatory Visit: Payer: Self-pay | Admitting: Family Medicine

## 2021-06-01 MED ORDER — TIZANIDINE HCL 2 MG PO TABS: 1.0000 mg | ORAL_TABLET | Freq: Every evening | ORAL | 1 refills | Status: DC | PRN

## 2021-09-29 ENCOUNTER — Ambulatory Visit: Payer: Medicare Other

## 2021-09-30 ENCOUNTER — Ambulatory Visit: Payer: Medicare Other

## 2021-10-03 ENCOUNTER — Ambulatory Visit (INDEPENDENT_AMBULATORY_CARE_PROVIDER_SITE_OTHER): Payer: Medicare Other

## 2021-10-03 DIAGNOSIS — Z0000 Encounter for general adult medical examination without abnormal findings: Secondary | ICD-10-CM | POA: Diagnosis not present

## 2021-10-03 DIAGNOSIS — Z1211 Encounter for screening for malignant neoplasm of colon: Secondary | ICD-10-CM

## 2021-10-03 NOTE — Progress Notes (Signed)
? ?Subjective:  ? Shaun [name] is a 71 y.o. male who presents for an Initial Medicare Annual Wellness Visit. ? ?I connected with  Shaun [name] on 10/03/21 by a audio enabled telemedicine application and verified that I am speaking with the correct person using two identifiers. ? ?Patient Location: Home ? ?Provider Location: Office/Clinic ? ?I discussed the limitations of evaluation and management by telemedicine. The patient expressed understanding and agreed to proceed.  ? ?Review of Systems    ? ?Cardiac Risk Factors include: advanced age (>45mn, >>79women);dyslipidemia;hypertension ? ?   ?Objective:  ?  ?Today's Vitals  ? 10/03/21 0906  ?PainSc: 3   ? ?There is no height or weight on file to calculate BMI. ? ? ?  10/03/2021  ?  9:05 AM 09/23/2020  ?  8:53 AM 09/23/2019  ?  8:05 AM 01/27/2016  ? 11:26 AM 07/29/2015  ?  9:59 AM 07/04/2012  ? 12:57 PM  ?Advanced Directives  ?Does Patient Have a Medical Advance Directive? Yes Yes Yes  Yes Patient does not have advance directive  ?Type of AParamed[place]of A[place] of facility DNR (pink MOST or yellow form);Living will [place]Living will HEast NicolausLiving will [place]Living will [place]Living will   ?Does patient want to make changes to medical advance directive?   No - Patient declined No - Patient declined    ?Copy of [place] Chart? No - copy requested No - copy requested No - copy requested No - copy requested No - copy requested   ?Pre-existing out of facility DNR order (yellow form or pink MOST form)      No  ? ? ?Current Medications (verified) ?Outpatient Encounter Medications as of 10/03/2021  ?Medication Sig  ? fluocinonide gel (LIDEX) 0.05 % APPLY TO TONGUE TWICE DAILY AS NEEDED  ? lisinopril (ZESTRIL) 10 MG tablet TAKE ONE TABLET BY MOUTH ONCE DAILY  ? magic mouthwash SOLN Take 5 mLs by mouth 2 (two) times daily.  ?  methadone (DOLOPHINE) 10 MG/5ML solution Take 8 mg by mouth every morning.  ? Multiple Vitamin (MULTIVITAMIN) tablet Take 1 tablet by mouth every morning.   ? tiZANidine (ZANAFLEX) 2 MG tablet Take 0.5-2 tablets (1-4 mg total) by mouth at bedtime as needed for muscle spasms.  ? ?No facility-administered encounter medications on file as of 10/03/2021.  ? ? ?Allergies (verified) ?Tomato  ? ?History: ?Past Medical History:  ?Diagnosis Date  ? Allergic state 01/30/2017  ? Arm mass   ? Arm skin lesion, left 11/12/2012  ? And sun damaged skin  ? Elevated BP 11/14/2012  ? Encounter for Medicare annual wellness exam 01/27/2016  ? GERD (gastroesophageal reflux disease) 01/27/2016  ? Hepatitis C, chronic ([place]   ? History of intravenous drug use in remission   ? History of oral lesions 05/14/2013  ? History of oral lesions 07/29/2015  ? Hypertension 11/14/2012  ? Left shoulder pain 12/16/2012  ? Methadone use 01/31/2015  ? Myalgia and myositis 08/13/2014  ? Preventative health care 05/14/2013  ? Preventative health care 05/14/2013  ? ?Past Surgical History:  ?Procedure Laterality Date  ? KNEE ARTHROSCOPY  1996  ? ligment repair  ? ?Family History  ?Problem Relation Age of Onset  ? Heart disease Father   ? Diabetes Father   ? Hyperlipidemia Father   ? Hypertension Father   ? Thyroid disease Sister   ? Heart disease Cousin   ?  MI  ? Diabetes Cousin   ? Obesity Cousin   ? Osteoporosis Mother   ? Diabetes Paternal Grandfather   ? Thyroid disease Maternal Grandmother   ? Cancer Neg Hx   ?     negative for colon and prostate  ? ?Social History  ? ?Socioeconomic History  ? Marital status: Married  ?  Spouse name: Not on file  ? Number of children: Not on file  ? Years of education: Not on file  ? Highest education level: Not on file  ?Occupational History  ? Not on file  ?Tobacco Use  ? Smoking status: Never  ? Smokeless tobacco: Never  ?Vaping Use  ? Vaping Use: Never used  ?Substance and Sexual Activity  ? Alcohol use: No  ?  Alcohol/week: 0.0  standard drinks  ? Drug use: No  ? Sexual activity: Yes  ?  Comment: avoids nuts, fruits, lives with wife, makes picture frames, building maintenance  ?Other Topics Concern  ? Not on file  ?Social History Narrative  ? Not on file  ? ?Social Determinants of Health  ? ?Financial Resource Strain: Low Risk   ? Difficulty of Paying Living Expenses: Not hard at all  ?Food Insecurity: No Food Insecurity  ? Worried About Charity fundraiser in the Last Year: Never true  ? Ran Out of Food in the Last Year: Never true  ?Transportation Needs: No Transportation Needs  ? Lack of Transportation (Medical): No  ? Lack of Transportation (Non-Medical): No  ?Physical Activity: Sufficiently Active  ? Days of Exercise per Week: 7 days  ? Minutes of Exercise per Session: 30 min  ?Stress: No Stress Concern Present  ? Feeling of Stress : Only a little  ?Social Connections: Moderately Isolated  ? Frequency of Communication with Friends and Family: More than three times a week  ? Frequency of Social Gatherings with Friends and Family: More than three times a week  ? Attends Religious Services: Never  ? Active Member of Clubs or Organizations: No  ? Attends Archivist Meetings: Never  ? Marital Status: Married  ? ? ?Tobacco Counseling ?Counseling given: Not Answered ? ? ?Clinical Intake: ? ?Pre-visit preparation completed: Yes ? ?Pain : 0-10 ?Pain Score: 3  ?Pain Type: Acute pain ?Pain Location: Back ?Pain Descriptors / Indicators: Aching, Sore ?Pain Onset: Today ?Pain Frequency: Occasional ? ?  ? ?Nutritional Risks: None ?Diabetes: No ? ?How often do you need to have someone help you when you read instructions, pamphlets, or other written materials from your doctor or pharmacy?: 1 - Never ? ?Diabetic?No ? ?Interpreter Needed?: No ? ?Information entered by :: Deb Loudin ? ? ?Activities of Daily Living ? ?  10/03/2021  ?  9:08 AM  ?In your present state of health, do you have any difficulty performing the following activities:   ?Hearing? 1  ?Vision? 1  ?Difficulty concentrating or making decisions? 0  ?Walking or climbing stairs? 0  ?Dressing or bathing? 0  ?Doing errands, shopping? 0  ?Preparing Food and eating ? N  ?Using the Toilet? N  ?In the past six months, have you accidently leaked urine? N  ?Do you have problems with loss of bowel control? N  ?Managing your Medications? N  ?Managing your Finances? N  ?Housekeeping or managing your Housekeeping? N  ? ? ?Patient Care Team: ?[name], MD as PCP - General (Family Medicine) ? ?Indicate any recent Medical Services you may have received from other than [facility] providers  in the past year (date may be approximate). ? ?   ?Assessment:  ? This is a routine wellness examination for Shaun [name]. ? ?Hearing/Vision screen ?No results found. ? ?Dietary issues and exercise activities discussed: ?Current Exercise Habits: Home exercise routine, Type of exercise: walking, Time (Minutes): 30, Frequency (Times/Week): 7, Weekly Exercise (Minutes/Week): 210, Intensity: Mild ? ? Goals Addressed   ? ?  ?  ?  ?  ? This Visit's Progress  ?  Maintain healthy active lifestyle.   On track  ? ?  ? ?Depression Screen ? ?  10/03/2021  ?  9:06 AM 09/23/2020  ? 10:49 AM 09/23/2020  ?  8:55 AM 09/25/2019  ?  3:41 PM 09/23/2019  ?  8:10 AM 01/30/2017  ?  9:31 AM 01/27/2016  ? 10:07 AM  ?PHQ 2/9 Scores  ?PHQ - 2 Score 0 0 0 0 0 0 0  ?PHQ- 9 Score  0  2     ?  ?Fall Risk ? ?  10/03/2021  ?  9:06 AM 09/23/2020  ?  8:55 AM 09/23/2019  ?  8:10 AM 01/30/2017  ?  9:31 AM 01/27/2016  ? 10:07 AM  ?Fall Risk   ?Falls in the past year? 0 0 0 No No  ?Number falls in past yr: 0 0 0    ?Injury with Fall? 0 0 0    ?Risk for fall due to : No Fall Risks      ?Follow up Falls evaluation completed Falls prevention discussed Education provided;Falls prevention discussed    ? ? ?FALL RISK PREVENTION PERTAINING TO THE HOME: ? ?Any stairs in or around the home? Yes  ?If so, are there any without handrails? No  ?Home free of loose throw rugs in walkways,  pet beds, electrical cords, etc? Yes  ?Adequate lighting in your home to reduce risk of falls? Yes  ? ?ASSISTIVE DEVICES UTILIZED TO PREVENT FALLS: ? ?Life alert? No  ?Use of a cane, walker or w/c? No  ?Grab ba

## 2021-10-03 NOTE — Patient Instructions (Signed)
Mr. Leather , ?Thank you for taking time to come for your Medicare Wellness Visit. I appreciate your ongoing commitment to your health goals. Please review the following plan we discussed and let me know if I can assist you in the future.  ? ?Screening recommendations/referrals: ?Colonoscopy: ordered 10/03/21 ?Recommended yearly ophthalmology/optometry visit for glaucoma screening and checkup ?Recommended yearly dental visit for hygiene and checkup ? ?Vaccinations: ?Influenza vaccine: up to date ?Pneumococcal vaccine: up to date ?Tdap vaccine: up to date ?Shingles vaccine: up to date   ?Covid-19: Due-May obtain vaccine at your local pharmacy.  ? ?Advanced directives: no, not on file ? ?Conditions/risks identified: see problem list ? ?Next appointment: Follow up in one year for your annual wellness visit. 10/05/22 ? ?Preventive Care 65 Years and Older, Male ?Preventive care refers to lifestyle choices and visits with your health care provider that can promote health and wellness. ?What does preventive care include? ?A yearly physical exam. This is also called an annual well check. ?Dental exams once or twice a year. ?Routine eye exams. Ask your health care provider how often you should have your eyes checked. ?Personal lifestyle choices, including: ?Daily care of your teeth and gums. ?Regular physical activity. ?Eating a healthy diet. ?Avoiding tobacco and drug use. ?Limiting alcohol use. ?Practicing safe sex. ?Taking low doses of aspirin every day. ?Taking vitamin and mineral supplements as recommended by your health care provider. ?What happens during an annual well check? ?The services and screenings done by your health care provider during your annual well check will depend on your age, overall health, lifestyle risk factors, and family history of disease. ?Counseling  ?Your health care provider may ask you questions about your: ?Alcohol use. ?Tobacco use. ?Drug use. ?Emotional well-being. ?Home and  relationship well-being. ?Sexual activity. ?Eating habits. ?History of falls. ?Memory and ability to understand (cognition). ?Work and work Statistician. ?Screening  ?You may have the following tests or measurements: ?Height, weight, and BMI. ?Blood pressure. ?Lipid and cholesterol levels. These may be checked every 5 years, or more frequently if you are over 63 years old. ?Skin check. ?Lung cancer screening. You may have this screening every year starting at age 29 if you have a 30-pack-year history of smoking and currently smoke or have quit within the past 15 years. ?Fecal occult blood test (FOBT) of the stool. You may have this test every year starting at age 48. ?Flexible sigmoidoscopy or colonoscopy. You may have a sigmoidoscopy every 5 years or a colonoscopy every 10 years starting at age 11. ?Prostate cancer screening. Recommendations will vary depending on your family history and other risks. ?Hepatitis C blood test. ?Hepatitis B blood test. ?Sexually transmitted disease (STD) testing. ?Diabetes screening. This is done by checking your blood sugar (glucose) after you have not eaten for a while (fasting). You may have this done every 1-3 years. ?Abdominal aortic aneurysm (AAA) screening. You may need this if you are a current or former smoker. ?Osteoporosis. You may be screened starting at age 49 if you are at high risk. ?Talk with your health care provider about your test results, treatment options, and if necessary, the need for more tests. ?Vaccines  ?Your health care provider may recommend certain vaccines, such as: ?Influenza vaccine. This is recommended every year. ?Tetanus, diphtheria, and acellular pertussis (Tdap, Td) vaccine. You may need a Td booster every 10 years. ?Zoster vaccine. You may need this after age 67. ?Pneumococcal 13-valent conjugate (PCV13) vaccine. One dose is recommended after age 60. ?Pneumococcal polysaccharide (  PPSV23) vaccine. One dose is recommended after age 25. ?Talk to your  health care provider about which screenings and vaccines you need and how often you need them. ?This information is not intended to replace advice given to you by your health care provider. Make sure you discuss any questions you have with your health care provider. ?Document Released: 06/25/2015 Document Revised: 02/16/2016 Document Reviewed: 03/30/2015 ?Elsevier Interactive Patient Education ? 2017 [place]. ? ?Fall Prevention in the Home ?Falls can cause injuries. They can happen to people of all ages. There are many things you can do to make your home safe and to help prevent falls. ?What can I do on the outside of my home? ?Regularly fix the edges of walkways and driveways and fix any cracks. ?Remove anything that might make you trip as you walk through a door, such as a raised step or threshold. ?Trim any bushes or trees on the path to your home. ?Use bright outdoor lighting. ?Clear any walking paths of anything that might make someone trip, such as rocks or tools. ?Regularly check to see if handrails are loose or broken. Make sure that both sides of any steps have handrails. ?Any raised decks and porches should have guardrails on the edges. ?Have any leaves, snow, or ice cleared regularly. ?Use sand or salt on walking paths during winter. ?Clean up any spills in your garage right away. This includes oil or grease spills. ?What can I do in the bathroom? ?Use night lights. ?Install grab bars by the toilet and in the tub and shower. Do not use towel bars as grab bars. ?Use non-skid mats or decals in the tub or shower. ?If you need to sit down in the shower, use a plastic, non-slip stool. ?Keep the floor dry. Clean up any water that spills on the floor as soon as it happens. ?Remove soap buildup in the tub or shower regularly. ?Attach bath mats securely with double-sided non-slip rug tape. ?Do not have throw rugs and other things on the floor that can make you trip. ?What can I do in the bedroom? ?Use night  lights. ?Make sure that you have a light by your bed that is easy to reach. ?Do not use any sheets or blankets that are too big for your bed. They should not hang down onto the floor. ?Have a firm chair that has side arms. You can use this for support while you get dressed. ?Do not have throw rugs and other things on the floor that can make you trip. ?What can I do in the kitchen? ?Clean up any spills right away. ?Avoid walking on wet floors. ?Keep items that you use a lot in easy-to-reach places. ?If you need to reach something above you, use a strong step stool that has a grab bar. ?Keep electrical cords out of the way. ?Do not use floor polish or wax that makes floors slippery. If you must use wax, use non-skid floor wax. ?Do not have throw rugs and other things on the floor that can make you trip. ?What can I do with my stairs? ?Do not leave any items on the stairs. ?Make sure that there are handrails on both sides of the stairs and use them. Fix handrails that are broken or loose. Make sure that handrails are as long as the stairways. ?Check any carpeting to make sure that it is firmly attached to the stairs. Fix any carpet that is loose or worn. ?Avoid having throw rugs at the top or  bottom of the stairs. If you do have throw rugs, attach them to the floor with carpet tape. ?Make sure that you have a light switch at the top of the stairs and the bottom of the stairs. If you do not have them, ask someone to add them for you. ?What else can I do to help prevent falls? ?Wear shoes that: ?Do not have high heels. ?Have rubber bottoms. ?Are comfortable and fit you well. ?Are closed at the toe. Do not wear sandals. ?If you use a stepladder: ?Make sure that it is fully opened. Do not climb a closed stepladder. ?Make sure that both sides of the stepladder are locked into place. ?Ask someone to hold it for you, if possible. ?Clearly mark and make sure that you can see: ?Any grab bars or handrails. ?First and last  steps. ?Where the edge of each step is. ?Use tools that help you move around (mobility aids) if they are needed. These include: ?Canes. ?Walkers. ?Scooters. ?Crutches. ?Turn on the lights when you go into a dark area.

## 2021-10-24 NOTE — Progress Notes (Deleted)
Subjective:    Patient ID: Shaun [name], male    DOB: 1951/02/11, 71 y.o.   MRN: [id number]  No chief complaint on file.   HPI Patient is in today for his annual physical exam.  Past Medical History:  Diagnosis Date   Allergic state 01/30/2017   Arm mass    Arm skin lesion, left 11/12/2012   And sun damaged skin   Elevated BP 11/14/2012   Encounter for Medicare annual wellness exam 01/27/2016   GERD (gastroesophageal reflux disease) 01/27/2016   Hepatitis C, chronic (HCC)    History of intravenous drug use in remission    History of oral lesions 05/14/2013   History of oral lesions 07/29/2015   Hypertension 11/14/2012   Left shoulder pain 12/16/2012   Methadone use 01/31/2015   Myalgia and myositis 08/13/2014   Preventative health care 05/14/2013   Preventative health care 05/14/2013    Past Surgical History:  Procedure Laterality Date   KNEE ARTHROSCOPY  1996   ligment repair    Family History  Problem Relation Age of Onset   Heart disease Father    Diabetes Father    Hyperlipidemia Father    Hypertension Father    Thyroid disease Sister    Heart disease Cousin        MI   Diabetes Cousin    Obesity Cousin    Osteoporosis Mother    Diabetes Paternal Grandfather    Thyroid disease Maternal Grandmother    Cancer Neg Hx        negative for colon and prostate    Social History   Socioeconomic History   Marital status: Married    Spouse name: Not on file   Number of children: Not on file   Years of education: Not on file   Highest education level: Not on file  Occupational History   Not on file  Tobacco Use   Smoking status: Never   Smokeless tobacco: Never  Vaping Use   Vaping Use: Never used  Substance and Sexual Activity   Alcohol use: No    Alcohol/week: 0.0 standard drinks   Drug use: No   Sexual activity: Yes    Comment: avoids nuts, fruits, lives with wife, makes picture frames, building maintenance  Other Topics Concern   Not on file  Social  History Narrative   Not on file   Social Determinants of Health   Financial Resource Strain: Low Risk    Difficulty of Paying Living Expenses: Not hard at all  Food Insecurity: No Food Insecurity   Worried About Charity fundraiser in the Last Year: Never true   Arboriculturist in the Last Year: Never true  Transportation Needs: No Transportation Needs   Lack of Transportation (Medical): No   Lack of Transportation (Non-Medical): No  Physical Activity: Sufficiently Active   Days of Exercise per Week: 7 days   Minutes of Exercise per Session: 30 min  Stress: No Stress Concern Present   Feeling of Stress : Only a little  Social Connections: Moderately Isolated   Frequency of Communication with Friends and Family: More than three times a week   Frequency of Social Gatherings with Friends and Family: More than three times a week   Attends Religious Services: Never   Marine scientist or Organizations: No   Attends Archivist Meetings: Never   Marital Status: Married  Human resources officer Violence: Not At Risk   Fear of Current or Ex-Partner:  No   Emotionally Abused: No   Physically Abused: No   Sexually Abused: No    Outpatient Medications Prior to Visit  Medication Sig Dispense Refill   fluocinonide gel (LIDEX) 0.05 % APPLY TO TONGUE TWICE DAILY AS NEEDED 60 g 0   lisinopril (ZESTRIL) 10 MG tablet TAKE ONE TABLET BY MOUTH ONCE DAILY 90 tablet 1   magic mouthwash SOLN Take 5 mLs by mouth 2 (two) times daily. 240 mL 1   methadone (DOLOPHINE) 10 MG/5ML solution Take 8 mg by mouth every morning.     Multiple Vitamin (MULTIVITAMIN) tablet Take 1 tablet by mouth every morning.      tiZANidine (ZANAFLEX) 2 MG tablet Take 0.5-2 tablets (1-4 mg total) by mouth at bedtime as needed for muscle spasms. 40 tablet 1   No facility-administered medications prior to visit.    Allergies  Allergen Reactions   Tomato Other (See Comments)    Mouth     ROS     Objective:     Physical Exam  There were no vitals taken for this visit. Wt Readings from Last 3 Encounters:  03/31/21 164 lb 3.2 oz (74.5 kg)  09/23/20 163 lb 6.4 oz (74.1 kg)  09/23/20 167 lb 9.6 oz (76 kg)    Diabetic Foot Exam - Simple   No data filed    Lab Results  Component Value Date   WBC 3.8 (L) 03/31/2021   HGB 14.2 03/31/2021   HCT 42.7 03/31/2021   PLT 150.0 03/31/2021   GLUCOSE 116 (H) 03/31/2021   CHOL 123 09/27/2020   TRIG 74.0 09/27/2020   HDL 54.10 09/27/2020   LDLCALC 54 09/27/2020   ALT 15 03/31/2021   AST 19 03/31/2021   NA 138 03/31/2021   K 4.7 03/31/2021   CL 98 03/31/2021   CREATININE 1.18 03/31/2021   BUN 17 03/31/2021   CO2 32 03/31/2021   TSH 1.04 03/31/2021   PSA 0.91 09/27/2020   INR 1.05 07/04/2012   HGBA1C 5.8 03/31/2021    Lab Results  Component Value Date   TSH 1.04 03/31/2021   Lab Results  Component Value Date   WBC 3.8 (L) 03/31/2021   HGB 14.2 03/31/2021   HCT 42.7 03/31/2021   MCV 91.4 03/31/2021   PLT 150.0 03/31/2021   Lab Results  Component Value Date   NA 138 03/31/2021   K 4.7 03/31/2021   CO2 32 03/31/2021   GLUCOSE 116 (H) 03/31/2021   BUN 17 03/31/2021   CREATININE 1.18 03/31/2021   BILITOT 0.8 03/31/2021   ALKPHOS 58 03/31/2021   AST 19 03/31/2021   ALT 15 03/31/2021   PROT 7.1 03/31/2021   ALBUMIN 4.9 03/31/2021   CALCIUM 9.9 03/31/2021   GFR 62.05 03/31/2021   Lab Results  Component Value Date   CHOL 123 09/27/2020   Lab Results  Component Value Date   HDL 54.10 09/27/2020   Lab Results  Component Value Date   LDLCALC 54 09/27/2020   Lab Results  Component Value Date   TRIG 74.0 09/27/2020   Lab Results  Component Value Date   CHOLHDL 2 09/27/2020   Lab Results  Component Value Date   HGBA1C 5.8 03/31/2021       Assessment & Plan:   Problem List Items Addressed This Visit   None   I am having Ivor Reining maintain his methadone, multivitamin, fluocinonide gel, magic mouthwash,  lisinopril, and tiZANidine.  No orders of the defined types were placed in  this encounter.

## 2021-10-25 ENCOUNTER — Other Ambulatory Visit: Payer: Self-pay | Admitting: Family Medicine

## 2021-10-25 ENCOUNTER — Encounter: Payer: Medicare Other | Admitting: Family Medicine

## 2021-10-25 DIAGNOSIS — Z0000 Encounter for general adult medical examination without abnormal findings: Secondary | ICD-10-CM

## 2021-10-25 DIAGNOSIS — E785 Hyperlipidemia, unspecified: Secondary | ICD-10-CM

## 2021-10-25 DIAGNOSIS — I10 Essential (primary) hypertension: Secondary | ICD-10-CM

## 2021-10-25 DIAGNOSIS — R739 Hyperglycemia, unspecified: Secondary | ICD-10-CM

## 2021-10-25 NOTE — Telephone Encounter (Signed)
Dr Charlett Blake, ? ?Original Rx for Tizanidine was sent to pharmacy on 06/01/21 for '2mg'$  strength with directions to take 1/2 to 2 tablets at bedtime.  [facility] said they do not carry the '2mg'$  and only have the '4mg'$  tablets.  They dispensed the '4mg'$  tablet with directions to take 1/4 to 1 tablet at bedtime back in December and are requesting refill on the '4mg'$  tablet.  Is it ok to dispense it like this going forward?  ?

## 2021-10-26 ENCOUNTER — Telehealth: Payer: Self-pay | Admitting: Family Medicine

## 2021-10-26 MED ORDER — MAGIC MOUTHWASH: 5.0000 mL | Freq: Two times a day (BID) | ORAL | 1 refills | Status: DC

## 2021-10-26 NOTE — Telephone Encounter (Signed)
Prescription refill faxed to pharmacy. ?

## 2021-10-26 NOTE — Telephone Encounter (Signed)
Medication: magic mouthwash SOLN ? ?Has the patient contacted their pharmacy? Yes.   ? ? ?Preferred Pharmacy:  ?[facility], [place] - [address]  ? [place] [address], [place] [address]  ?Phone:  [phone]  Fax:  [phone]  ?

## 2021-10-28 NOTE — Telephone Encounter (Signed)
Pt called to inform us that the magic mouthwash was denied. Pharmacy did not advise pt as to why and to contact his PCP to resolve this issue.

## 2021-11-16 ENCOUNTER — Other Ambulatory Visit: Payer: Self-pay | Admitting: Family Medicine

## 2021-11-22 ENCOUNTER — Encounter: Payer: Self-pay | Admitting: Family Medicine

## 2021-11-22 ENCOUNTER — Ambulatory Visit (INDEPENDENT_AMBULATORY_CARE_PROVIDER_SITE_OTHER): Payer: Medicare Other | Admitting: Family Medicine

## 2021-11-22 VITALS — BP 143/67 | HR 69 | Ht 72.0 in | Wt 164.4 lb

## 2021-11-22 DIAGNOSIS — I10 Essential (primary) hypertension: Secondary | ICD-10-CM

## 2021-11-22 DIAGNOSIS — R739 Hyperglycemia, unspecified: Secondary | ICD-10-CM

## 2021-11-22 DIAGNOSIS — Z0000 Encounter for general adult medical examination without abnormal findings: Secondary | ICD-10-CM

## 2021-11-22 DIAGNOSIS — M545 Low back pain, unspecified: Secondary | ICD-10-CM

## 2021-11-22 DIAGNOSIS — E785 Hyperlipidemia, unspecified: Secondary | ICD-10-CM | POA: Diagnosis not present

## 2021-11-22 DIAGNOSIS — K1379 Other lesions of oral mucosa: Secondary | ICD-10-CM

## 2021-11-22 LAB — LIPID PANEL
Cholesterol: 133 mg/dL (ref 0–200)
HDL: 56.7 mg/dL (ref >39.00)
LDL Cholesterol: 60 mg/dL (ref 0–99)
NonHDL: 76.18
Total CHOL/HDL Ratio: 2
Triglycerides: 82 mg/dL (ref 0.0–149.0)
VLDL: 16.4 mg/dL (ref 0.0–40.0)

## 2021-11-22 LAB — CBC
HCT: 40.5 % (ref 39.0–52.0)
Hemoglobin: 13.6 g/dL (ref 13.0–17.0)
MCHC: 33.5 g/dL (ref 30.0–36.0)
MCV: 90.7 fl (ref 78.0–100.0)
Platelets: 147 10*3/uL — ABNORMAL LOW (ref 150.0–400.0)
RBC: 4.47 Mil/uL (ref 4.22–5.81)
RDW: 12.9 % (ref 11.5–15.5)
WBC: 3.3 10*3/uL — ABNORMAL LOW (ref 4.0–10.5)

## 2021-11-22 LAB — COMPREHENSIVE METABOLIC PANEL
ALT: 12 U/L (ref 0–53)
AST: 16 U/L (ref 0–37)
Albumin: 4.6 g/dL (ref 3.5–5.2)
Alkaline Phosphatase: 58 U/L (ref 39–117)
BUN: 15 mg/dL (ref 6–23)
CO2: 29 mEq/L (ref 19–32)
Calcium: 9.8 mg/dL (ref 8.4–10.5)
Chloride: 100 mEq/L (ref 96–112)
Creatinine, Ser: 1.13 mg/dL (ref 0.40–1.50)
GFR: 65.07 mL/min (ref >60.00)
Glucose, Bld: 115 mg/dL — ABNORMAL HIGH (ref 70–99)
Potassium: 4.3 mEq/L (ref 3.5–5.1)
Sodium: 138 mEq/L (ref 135–145)
Total Bilirubin: 0.7 mg/dL (ref 0.2–1.2)
Total Protein: 6.7 g/dL (ref 6.0–8.3)

## 2021-11-22 LAB — HEMOGLOBIN A1C: Hgb A1c MFr Bld: 5.9 % (ref 4.6–6.5)

## 2021-11-22 LAB — TSH: TSH: 1.04 u[IU]/mL (ref 0.35–5.50)

## 2021-11-22 MED ORDER — MAGIC MOUTHWASH: 5.0000 mL | Freq: Two times a day (BID) | ORAL | 1 refills | Status: DC

## 2021-11-22 MED ORDER — MAGIC MOUTHWASH: 5.0000 mL | Freq: Four times a day (QID) | ORAL | 1 refills | Status: DC | PRN

## 2021-11-22 NOTE — Progress Notes (Signed)
Complete physical exam  Patient: Shaun [name]   DOB: [birth year]   71 y.o. Male  MRN: [id number]  Subjective:    CC: CPE   Shaun [name] is a 71 y.o. male who presents today for a complete physical exam. He reports consuming a low sodium and low-carb  diet. Home exercise routine includes walking dog. He generally feels well. He reports sleeping well. He does not have additional problems to discuss today.    Most recent fall risk assessment:    11/22/2021    8:44 AM  Fall Risk   Falls in the past year? 0  Number falls in past yr: 0  Injury with Fall? 0  Risk for fall due to : No Fall Risks  Follow up Falls evaluation completed     Most recent depression screenings:    11/22/2021    8:44 AM 10/03/2021    9:06 AM  PHQ 2/9 Scores  PHQ - 2 Score 0 0    Vision:Not within last year , Dental: No current dental problems and Receives regular dental care, STD: no concerns, and PSA: Declines PSA testing at this time   Patient Active Problem List   Diagnosis Date Noted   Recurrent oral ulcers 09/24/2020   Educated about COVID-19 virus infection 09/27/2019   Hyperlipidemia 02/04/2017   Allergic state 01/30/2017   Encounter for Medicare annual wellness exam 01/27/2016   GERD (gastroesophageal reflux disease) 01/27/2016   Thrombocytopenia ([place]) 01/31/2015   Methadone use 01/31/2015   Myalgia 08/13/2014   Urinary hesitancy 08/02/2014   Preventative health care 05/14/2013   History of oral lesions 05/14/2013   Left shoulder pain 12/16/2012   Hypertension 11/14/2012   Arm skin lesion, left 11/12/2012   Lymphadenopathy 05/22/2012   Hyperglycemia 05/14/2012   Low back pain 07/20/2008   Past Medical History:  Diagnosis Date   Allergic state 01/30/2017   Arm mass    Arm skin lesion, left 11/12/2012   And sun damaged skin   Elevated BP 11/14/2012   Encounter for Medicare annual wellness exam 01/27/2016   GERD (gastroesophageal reflux disease) 01/27/2016   Hepatitis C, chronic  (HCC)    History of intravenous drug use in remission    History of oral lesions 05/14/2013   History of oral lesions 07/29/2015   Hypertension 11/14/2012   Left shoulder pain 12/16/2012   Methadone use 01/31/2015   Myalgia and myositis 08/13/2014   Preventative health care 05/14/2013   Preventative health care 05/14/2013   Allergies  Allergen Reactions   Tomato Other (See Comments)    Mouth       Patient Care Team: [name], MD as PCP - General (Family Medicine)   Outpatient Medications Prior to Visit  Medication Sig   tiZANidine (ZANAFLEX) 4 MG tablet TAKE 1/4 TO 1 TABLET BY MOUTH AT BEDTIMEAS NEEDED FOR MUSCLE SPASMS   fluocinonide gel (LIDEX) 0.05 % APPLY TO TONGUE TWICE DAILY AS NEEDED   lisinopril (ZESTRIL) 10 MG tablet TAKE ONE TABLET BY MOUTH ONCE DAILY   methadone (DOLOPHINE) 10 MG/5ML solution Take 8 mg by mouth every morning.   Multiple Vitamin (MULTIVITAMIN) tablet Take 1 tablet by mouth every morning.    [DISCONTINUED] magic mouthwash SOLN Take 5 mLs by mouth 2 (two) times daily.   No facility-administered medications prior to visit.    ROS All review of systems negative except what is listed in the HPI        Objective:     BP Marland Kitchen)  143/67   Pulse 69   Ht 6' (1.829 m)   Wt 164 lb 6.4 oz (74.6 kg)   BMI 22.30 kg/m    Physical Exam Vitals reviewed.  Constitutional:      General: He is not in acute distress.    Appearance: Normal appearance. He is normal weight. He is not ill-appearing.  HENT:     Head: Normocephalic and atraumatic.     Right Ear: Tympanic membrane normal.     Left Ear: Tympanic membrane normal.     Nose: Nose normal.     Mouth/Throat:     Mouth: Mucous membranes are moist.     Pharynx: Oropharynx is clear.  Eyes:     Extraocular Movements: Extraocular movements intact.     Conjunctiva/sclera: Conjunctivae normal.     Pupils: Pupils are equal, round, and reactive to light.  Cardiovascular:     Rate and Rhythm: Normal rate and  regular rhythm.     Pulses: Normal pulses.     Heart sounds: Normal heart sounds.  Pulmonary:     Effort: Pulmonary effort is normal.     Breath sounds: Normal breath sounds.  Abdominal:     General: Bowel sounds are normal. There is no distension.     Palpations: Abdomen is soft. There is no mass.     Tenderness: There is no abdominal tenderness. There is no right CVA tenderness, left CVA tenderness, guarding or rebound.  Genitourinary:    Comments: deferred Musculoskeletal:        General: Normal range of motion.     Cervical back: Normal range of motion and neck supple.  Skin:    General: Skin is warm and dry.     Capillary Refill: Capillary refill takes less than 2 seconds.  Neurological:     General: No focal deficit present.     Mental Status: He is alert and oriented to person, place, and time. Mental status is at baseline.  Psychiatric:        Mood and Affect: Mood normal.        Behavior: Behavior normal.        Thought Content: Thought content normal.        Judgment: Judgment normal.         No results found for any visits on 11/22/21.     Assessment & Plan:    Routine Health Maintenance and Physical Exam  Immunization History  Administered Date(s) Administered   Hepatitis B, adult 05/01/2016, 06/01/2016, 10/31/2016   Influenza Split 05/16/2011, 05/14/2012   Influenza Whole 03/26/2009   Influenza, High Dose Seasonal PF 05/01/2016, 08/02/2017, 05/20/2018, 04/14/2019   Influenza,inj,Quad PF,6+ Mos 05/13/2013, 07/21/2014, 07/29/2015   Pneumococcal Conjugate-13 07/29/2015   Pneumococcal Polysaccharide-23 07/20/2008, 01/30/2017   Td 03/26/2009   Tdap 09/23/2020   Zoster, Live 05/16/2011    Health Maintenance  Topic Date Due   Zoster Vaccines- Shingrix (1 of 2) Never done   COLONOSCOPY (Pts 45-44yr Insurance coverage will need to be confirmed)  08/11/2020   INFLUENZA VACCINE  01/10/2022   TETANUS/TDAP  09/24/2030   Pneumonia Vaccine 71 Years old   Completed   Hepatitis C Screening  Completed   HPV VACCINES  Aged Out   COVID-19 Vaccine  Discontinued    Discussed health benefits of physical activity, and encouraged him to engage in regular exercise appropriate for his age and condition.  Problem List Items Addressed This Visit       Cardiovascular and Mediastinum   Hypertension  Mildly elevated today, reports normal readings at home. Recommended monitoring at home and let us know if >140/80. Continue with healthy, low-carb, low-sodium diet and exercise as tolerated.       Relevant Orders   CBC   Comprehensive metabolic panel   Lipid panel   TSH     Digestive   Recurrent oral ulcers    Refilling magic mouthwash for as needed use. No ulcers today.       Relevant Medications   magic mouthwash SOLN   magic mouthwash SOLN     Other   Low back pain    Previously stable. Rechecking labs today. Continue heart healthy, low-carb diet and exercise as tolerated.       Hyperglycemia    Previously stable. Rechecking labs today. Continue heart healthy, low-carb diet and exercise as tolerated.       Relevant Orders   Hemoglobin A1c   Hyperlipidemia    Previously stable. Rechecking labs today. Continue heart healthy, low-carb diet and exercise as tolerated.       Relevant Orders   Comprehensive metabolic panel   Lipid panel   Other Visit Diagnoses     Annual physical exam    -  Primary   Relevant Orders   Hemoglobin A1c   CBC   Comprehensive metabolic panel   Lipid panel   TSH     LABORATORY TESTING:  - Health maintenance labs ordered today as discussed above.           CBC, CMP, LIPIDS          TSH           A1c  IMMUNIZATIONS:   - Tdap: Tetanus vaccination status reviewed:  - Influenza: n/a - Pneumonia vaccines: UTD - HPV: n/a - Shingrix vaccine: he will consider, advised he would need to go to pharmacy    SCREENING: - Mammogram: n/a - Bone Density: n/a - Colonoscopy: 2012, says he will think  about it and let us know  Discussed with patient purpose of the colonoscopy is to detect colon cancer at curable precancerous or early stages  - AAA Screening: n/a - Lung Cancer Screening: n/a    Return in about 6 months (around 05/24/2022) for routine f/u PCP.     Terrilyn Saver, NP

## 2021-11-22 NOTE — Assessment & Plan Note (Signed)
Mildly elevated today, reports normal readings at home. Recommended monitoring at home and let us know if >140/80. Continue with healthy, low-carb, low-sodium diet and exercise as tolerated.

## 2021-11-22 NOTE — Assessment & Plan Note (Signed)
Refilling magic mouthwash for as needed use. No ulcers today.

## 2021-11-22 NOTE — Assessment & Plan Note (Signed)
Previously stable. Rechecking labs today. Continue heart healthy, low-carb diet and exercise as tolerated.

## 2021-11-22 NOTE — Assessment & Plan Note (Addendum)
Previously stable. Rechecking labs today. Continue heart healthy, low-carb diet and exercise as tolerated.

## 2021-12-21 DIAGNOSIS — H2513 Age-related nuclear cataract, bilateral: Secondary | ICD-10-CM | POA: Diagnosis not present

## 2021-12-21 DIAGNOSIS — H43813 Vitreous degeneration, bilateral: Secondary | ICD-10-CM | POA: Diagnosis not present

## 2021-12-21 DIAGNOSIS — H25013 Cortical age-related cataract, bilateral: Secondary | ICD-10-CM | POA: Diagnosis not present

## 2021-12-21 DIAGNOSIS — H5211 Myopia, right eye: Secondary | ICD-10-CM | POA: Diagnosis not present

## 2022-05-17 ENCOUNTER — Other Ambulatory Visit: Payer: Self-pay | Admitting: Family Medicine

## 2022-05-24 NOTE — Assessment & Plan Note (Addendum)
hgba1c acceptable, minimize simple carbs. Increase exercise as tolerated.  Consider Shingrix is the new shingles shot, 2 shots over 2-6 months, confirm coverage with insurance and document, then can return here for shots with nurse appt or at pharmacy  Consider RSV (respiratory syncitial virus) vaccine at pharmacy, Arexvy Consider Covid booster at pharmacy Consider High dose flu shot

## 2022-05-24 NOTE — Assessment & Plan Note (Signed)
Continue to monitor, asymptomatic 

## 2022-05-24 NOTE — Assessment & Plan Note (Signed)
Well controlled, no changes to meds. Encouraged heart healthy diet such as the DASH diet and exercise as tolerated.  °

## 2022-05-24 NOTE — Assessment & Plan Note (Signed)
Encourage heart healthy diet such as MIND or DASH diet, increase exercise, avoid trans fats, simple carbohydrates and processed foods, consider a krill or fish or flaxseed oil cap daily.  °

## 2022-05-24 NOTE — Assessment & Plan Note (Signed)
Continues to follow with outside clinic

## 2022-05-25 ENCOUNTER — Ambulatory Visit (INDEPENDENT_AMBULATORY_CARE_PROVIDER_SITE_OTHER): Payer: Medicare Other | Admitting: Family Medicine

## 2022-05-25 VITALS — BP 142/68 | HR 77 | Temp 98.0°F | Resp 16 | Ht 72.0 in | Wt 164.0 lb

## 2022-05-25 DIAGNOSIS — R739 Hyperglycemia, unspecified: Secondary | ICD-10-CM

## 2022-05-25 DIAGNOSIS — D696 Thrombocytopenia, unspecified: Secondary | ICD-10-CM

## 2022-05-25 DIAGNOSIS — I10 Essential (primary) hypertension: Secondary | ICD-10-CM

## 2022-05-25 DIAGNOSIS — E785 Hyperlipidemia, unspecified: Secondary | ICD-10-CM

## 2022-05-25 DIAGNOSIS — K148 Other diseases of tongue: Secondary | ICD-10-CM

## 2022-05-25 DIAGNOSIS — F1190 Opioid use, unspecified, uncomplicated: Secondary | ICD-10-CM

## 2022-05-25 DIAGNOSIS — K1379 Other lesions of oral mucosa: Secondary | ICD-10-CM

## 2022-05-25 LAB — CBC WITH DIFFERENTIAL/PLATELET
Basophils Absolute: 0 10*3/uL (ref 0.0–0.1)
Basophils Relative: 0.8 % (ref 0.0–3.0)
Eosinophils Absolute: 0 10*3/uL (ref 0.0–0.7)
Eosinophils Relative: 0.8 % (ref 0.0–5.0)
HCT: 41.7 % (ref 39.0–52.0)
Hemoglobin: 14.2 g/dL (ref 13.0–17.0)
Lymphocytes Relative: 18.7 % (ref 12.0–46.0)
Lymphs Abs: 0.8 10*3/uL (ref 0.7–4.0)
MCHC: 34.1 g/dL (ref 30.0–36.0)
MCV: 90.2 fl (ref 78.0–100.0)
Monocytes Absolute: 0.3 10*3/uL (ref 0.1–1.0)
Monocytes Relative: 7.6 % (ref 3.0–12.0)
Neutro Abs: 3 10*3/uL (ref 1.4–7.7)
Neutrophils Relative %: 72.1 % (ref 43.0–77.0)
Platelets: 166 10*3/uL (ref 150.0–400.0)
RBC: 4.63 Mil/uL (ref 4.22–5.81)
RDW: 12.5 % (ref 11.5–15.5)
WBC: 4.2 10*3/uL (ref 4.0–10.5)

## 2022-05-25 LAB — LIPID PANEL
Cholesterol: 142 mg/dL (ref 0–200)
HDL: 57.7 mg/dL (ref >39.00)
LDL Cholesterol: 71 mg/dL (ref 0–99)
NonHDL: 83.86
Total CHOL/HDL Ratio: 2
Triglycerides: 65 mg/dL (ref 0.0–149.0)
VLDL: 13 mg/dL (ref 0.0–40.0)

## 2022-05-25 LAB — COMPREHENSIVE METABOLIC PANEL
ALT: 17 U/L (ref 0–53)
AST: 19 U/L (ref 0–37)
Albumin: 4.9 g/dL (ref 3.5–5.2)
Alkaline Phosphatase: 64 U/L (ref 39–117)
BUN: 17 mg/dL (ref 6–23)
CO2: 33 mEq/L — ABNORMAL HIGH (ref 19–32)
Calcium: 9.8 mg/dL (ref 8.4–10.5)
Chloride: 98 mEq/L (ref 96–112)
Creatinine, Ser: 1.13 mg/dL (ref 0.40–1.50)
GFR: 64.84 mL/min (ref >60.00)
Glucose, Bld: 104 mg/dL — ABNORMAL HIGH (ref 70–99)
Potassium: 4.6 mEq/L (ref 3.5–5.1)
Sodium: 137 mEq/L (ref 135–145)
Total Bilirubin: 0.7 mg/dL (ref 0.2–1.2)
Total Protein: 7.1 g/dL (ref 6.0–8.3)

## 2022-05-25 LAB — TSH: TSH: 0.98 u[IU]/mL (ref 0.35–5.50)

## 2022-05-25 LAB — HEMOGLOBIN A1C: Hgb A1c MFr Bld: 6.1 % (ref 4.6–6.5)

## 2022-05-25 MED ORDER — TIZANIDINE HCL 4 MG PO TABS: 2.0000 mg | ORAL_TABLET | Freq: Two times a day (BID) | ORAL | 3 refills | Status: DC | PRN

## 2022-05-25 MED ORDER — MAGIC MOUTHWASH: 5.0000 mL | Freq: Four times a day (QID) | ORAL | 1 refills | Status: DC | PRN

## 2022-05-25 NOTE — Patient Instructions (Signed)
Shingrix is the new shingles shot, 2 shots over 2-6 months, confirm coverage with insurance and document, then can return here for shots with nurse appt or at [facility] and flu booster RSV, Respiratory Syncitial Virus, Arexvy vaccination once at phone

## 2022-05-25 NOTE — Progress Notes (Signed)
Subjective:   By signing my name below, I, Shaun [name], attest that this documentation has been prepared under the direction and in the presence of Shaun Lukes, MD., 05/25/2022.     Patient ID: Shaun [name], male    DOB: [birth year], 71 y.o.   MRN: [id number]  Chief Complaint  Patient presents with   Follow-up    Follow up   HPI Patient is in today for an office visit. He denies CP/palpitations/SOB/HA/congestion/fevers/GI or GU c/o.  Back/Hip Pain Patient reports that he injured his back this past weekend while lifting heavy objects at home. He has been experiencing pain in his lower back and right hip since the incident and states that the pain was much worse this morning. Sleeping on his right side exacerbates the pain. He has been experiencing chronic, intermittent hip pain since his teenage years after falling off a tractor and this incident has worsened the pain. He has taken Tizanidine in the past and is requesting a refill on this.  Oral Thrush Patient is still taking Fluocinonide gel 0.05% and Magic Mouthwash and is requesting a refill on these. Oral thrush is controlled. He noticed a maculopapular region on his tongue on Thanksgiving and is interested in receiving an ENT referral to further examine this.   Past Medical History:  Diagnosis Date   Allergic state 01/30/2017   Arm mass    Arm skin lesion, left 11/12/2012   And sun damaged skin   Elevated BP 11/14/2012   Encounter for Medicare annual wellness exam 01/27/2016   GERD (gastroesophageal reflux disease) 01/27/2016   Hepatitis C, chronic (HCC)    History of intravenous drug use in remission    History of oral lesions 05/14/2013   History of oral lesions 07/29/2015   Hypertension 11/14/2012   Left shoulder pain 12/16/2012   Methadone use 01/31/2015   Myalgia and myositis 08/13/2014   Preventative health care 05/14/2013   Preventative health care 05/14/2013   Past Surgical History:  Procedure Laterality Date   KNEE  ARTHROSCOPY  1996   ligment repair   Family History  Problem Relation Age of Onset   Heart disease Father    Diabetes Father    Hyperlipidemia Father    Hypertension Father    Thyroid disease Sister    Heart disease Cousin        MI   Diabetes Cousin    Obesity Cousin    Osteoporosis Mother    Diabetes Paternal Grandfather    Thyroid disease Maternal Grandmother    Cancer Neg Hx        negative for colon and prostate   Social History   Socioeconomic History   Marital status: Married    Spouse name: Not on file   Number of children: Not on file   Years of education: Not on file   Highest education level: Not on file  Occupational History   Not on file  Tobacco Use   Smoking status: Never   Smokeless tobacco: Never  Vaping Use   Vaping Use: Never used  Substance and Sexual Activity   Alcohol use: No    Alcohol/week: 0.0 standard drinks of alcohol   Drug use: No   Sexual activity: Yes    Comment: avoids nuts, fruits, lives with wife, makes picture frames, building maintenance  Other Topics Concern   Not on file  Social History Narrative   Not on file   Social Determinants of Health   Financial Resource Strain: Low  Risk  (10/03/2021)   Overall Financial Resource Strain (CARDIA)    Difficulty of Paying Living Expenses: Not hard at all  Food Insecurity: No Food Insecurity (10/03/2021)   Hunger Vital Sign    Worried About Running Out of Food in the Last Year: Never true    Ran Out of Food in the Last Year: Never true  Transportation Needs: No Transportation Needs (10/03/2021)   PRAPARE - Hydrologist (Medical): No    Lack of Transportation (Non-Medical): No  Physical Activity: Sufficiently Active (10/03/2021)   Exercise Vital Sign    Days of Exercise per Week: 7 days    Minutes of Exercise per Session: 30 min  Stress: No Stress Concern Present (10/03/2021)   [place]     Feeling of Stress : Only a little  Social Connections: Moderately Isolated (10/03/2021)   Social Connection and Isolation Panel [NHANES]    Frequency of Communication with Friends and Family: More than three times a week    Frequency of Social Gatherings with Friends and Family: More than three times a week    Attends Religious Services: Never    Marine scientist or Organizations: No    Attends Archivist Meetings: Never    Marital Status: Married  Human resources officer Violence: Not At Risk (10/03/2021)   Humiliation, Afraid, Rape, and Kick questionnaire    Fear of Current or Ex-Partner: No    Emotionally Abused: No    Physically Abused: No    Sexually Abused: No   Outpatient Medications Prior to Visit  Medication Sig Dispense Refill   fluocinonide gel (LIDEX) 0.05 % APPLY TO TONGUE TWICE DAILY AS NEEDED 60 g 0   lisinopril (ZESTRIL) 10 MG tablet Take 1 tablet (10 mg total) by mouth daily. 90 tablet 1   magic mouthwash SOLN Take 5 mLs by mouth 2 (two) times daily. 240 mL 1   methadone (DOLOPHINE) 10 MG/5ML solution Take 8 mg by mouth every morning.     Multiple Vitamin (MULTIVITAMIN) tablet Take 1 tablet by mouth every morning.      magic mouthwash SOLN Take 5 mLs by mouth 4 (four) times daily as needed for mouth pain. Swish and spit 240 mL 1   tiZANidine (ZANAFLEX) 4 MG tablet TAKE 1/4 TO 1 TABLET BY MOUTH AT BEDTIMEAS NEEDED FOR MUSCLE SPASMS 20 tablet 2   No facility-administered medications prior to visit.   Allergies  Allergen Reactions   Tomato Other (See Comments)    Mouth    Review of Systems  Constitutional:  Negative for chills and fever.  HENT:  Negative for congestion.        (+) oral thrush.  Respiratory:  Negative for shortness of breath.   Cardiovascular:  Negative for chest pain and palpitations.  Gastrointestinal:  Negative for abdominal pain, blood in stool, constipation, diarrhea, nausea and vomiting.  Genitourinary:  Negative for dysuria,  frequency, hematuria and urgency.  Musculoskeletal:  Positive for back pain and joint pain (left hip).  Skin:           Neurological:  Negative for headaches.      Objective:    Physical Exam Constitutional:      General: He is not in acute distress.    Appearance: Normal appearance. He is normal weight. He is not ill-appearing.  HENT:     Head: Normocephalic and atraumatic.     Right Ear: External ear  normal.     Left Ear: External ear normal.     Nose: Nose normal.     Mouth/Throat:     Mouth: Mucous membranes are moist.     Pharynx: Oropharynx is clear.     Comments: 4 mm raised maculopapular region on the left side of the tongue. Eyes:     General:        Right eye: No discharge.        Left eye: No discharge.     Extraocular Movements: Extraocular movements intact.     Conjunctiva/sclera: Conjunctivae normal.     Pupils: Pupils are equal, round, and reactive to light.  Cardiovascular:     Rate and Rhythm: Normal rate and regular rhythm.     Pulses: Normal pulses.     Heart sounds: Normal heart sounds. No murmur heard.    No gallop.  Pulmonary:     Effort: Pulmonary effort is normal. No respiratory distress.     Breath sounds: Normal breath sounds. No wheezing or rales.  Abdominal:     General: Bowel sounds are normal.     Palpations: Abdomen is soft.     Tenderness: There is no abdominal tenderness. There is no guarding.  Musculoskeletal:        General: Normal range of motion.     Cervical back: Normal range of motion.     Right lower leg: No edema.     Left lower leg: No edema.  Skin:    General: Skin is warm and dry.  Neurological:     Mental Status: He is alert and oriented to person, place, and time.  Psychiatric:        Mood and Affect: Mood normal.        Behavior: Behavior normal.        Judgment: Judgment normal.    BP (!) 142/68 (BP Location: Left Arm, Patient Position: Sitting, Cuff Size: Normal)   Pulse 77   Temp 98 F (36.7 C) (Oral)    Resp 16   Ht 6' (1.829 m)   Wt 164 lb (74.4 kg)   SpO2 99%   BMI 22.24 kg/m  Wt Readings from Last 3 Encounters:  05/25/22 164 lb (74.4 kg)  11/22/21 164 lb 6.4 oz (74.6 kg)  03/31/21 164 lb 3.2 oz (74.5 kg)   Diabetic Foot Exam - Simple   No data filed    Lab Results  Component Value Date   WBC 4.2 05/25/2022   HGB 14.2 05/25/2022   HCT 41.7 05/25/2022   PLT 166.0 05/25/2022   GLUCOSE 104 (H) 05/25/2022   CHOL 142 05/25/2022   TRIG 65.0 05/25/2022   HDL 57.70 05/25/2022   LDLCALC 71 05/25/2022   ALT 17 05/25/2022   AST 19 05/25/2022   NA 137 05/25/2022   K 4.6 05/25/2022   CL 98 05/25/2022   CREATININE 1.13 05/25/2022   BUN 17 05/25/2022   CO2 33 (H) 05/25/2022   TSH 0.98 05/25/2022   PSA 0.91 09/27/2020   INR 1.05 07/04/2012   HGBA1C 6.1 05/25/2022   Lab Results  Component Value Date   TSH 0.98 05/25/2022   Lab Results  Component Value Date   WBC 4.2 05/25/2022   HGB 14.2 05/25/2022   HCT 41.7 05/25/2022   MCV 90.2 05/25/2022   PLT 166.0 05/25/2022   Lab Results  Component Value Date   NA 137 05/25/2022   K 4.6 05/25/2022   CO2 33 (H) 05/25/2022  GLUCOSE 104 (H) 05/25/2022   BUN 17 05/25/2022   CREATININE 1.13 05/25/2022   BILITOT 0.7 05/25/2022   ALKPHOS 64 05/25/2022   AST 19 05/25/2022   ALT 17 05/25/2022   PROT 7.1 05/25/2022   ALBUMIN 4.9 05/25/2022   CALCIUM 9.8 05/25/2022   GFR 64.84 05/25/2022   Lab Results  Component Value Date   CHOL 142 05/25/2022   Lab Results  Component Value Date   HDL 57.70 05/25/2022   Lab Results  Component Value Date   LDLCALC 71 05/25/2022   Lab Results  Component Value Date   TRIG 65.0 05/25/2022   Lab Results  Component Value Date   CHOLHDL 2 05/25/2022   Lab Results  Component Value Date   HGBA1C 6.1 05/25/2022      Assessment & Plan:  Advanced Directives: Patient provided completed advanced care planning documents today which will be uploaded.  Back/Hip Pain: Tizanidine 2-4 mg  bid prn. Patient will consider an x-ray of the right hip.  ENT: Referral made.  Immunizations: Encouraged COVID-19, Influenza, RSV, and Shingles immunization.  Refills: Fluocinonide gel 0.05% and Magic Mouthwash refilled today.  Problem List Items Addressed This Visit     Hyperglycemia - Primary    hgba1c acceptable, minimize simple carbs. Increase exercise as tolerated.  Consider Shingrix is the new shingles shot, 2 shots over 2-6 months, confirm coverage with insurance and document, then can return here for shots with nurse appt or at pharmacy  Consider RSV (respiratory syncitial virus) vaccine at pharmacy, Arexvy Consider Covid booster at pharmacy Consider High dose flu shot       Relevant Orders   HgB A1c (Completed)   Hypertension    Well controlled, no changes to meds. Encouraged heart healthy diet such as the DASH diet and exercise as tolerated.       Relevant Orders   CBC with Differential/Platelet (Completed)   Comprehensive metabolic panel (Completed)   TSH (Completed)   Thrombocytopenia (HCC)    Continue to monitor, asymptomatic      Methadone use    Continues to follow with outside clinic      Hyperlipidemia    Encourage heart healthy diet such as MIND or DASH diet, increase exercise, avoid trans fats, simple carbohydrates and processed foods, consider a krill or fish or flaxseed oil cap daily.       Relevant Orders   Lipid panel (Completed)   Recurrent oral ulcers   Relevant Medications   magic mouthwash SOLN   Lesion of tongue    He has noted it in years past but has recently grown some and become irritated. Is referred to ENT for evaluation and  he will discuss with his dentist as well      Relevant Orders   Ambulatory referral to ENT   Meds ordered this encounter  Medications   tiZANidine (ZANAFLEX) 4 MG tablet    Sig: Take 0.5-1 tablets (2-4 mg total) by mouth 2 (two) times daily as needed for muscle spasms.    Dispense:  30 tablet    Refill:   3   magic mouthwash SOLN    Sig: Take 5 mLs by mouth 4 (four) times daily as needed for mouth pain. Swish and spit    Dispense:  240 mL    Refill:  1    ([facility]) Diphenhydramine (12.37m/5ml) 21110mNystatin (100,000 units/53m153m 60m78mdrocortisone '60mg'$  tablet   I, StacPenni Homans, personally preformed the services described in this documentation.  All medical  record entries made by the scribe were at my direction and in my presence.  I have reviewed the chart and discharge instructions (if applicable) and agree that the record reflects my personal performance and is accurate and complete. 05/25/2022  I,Mohammed Iqbal,acting as a scribe for Penni Homans, MD.,have documented all relevant documentation on the behalf of Penni Homans, MD,as directed by  Penni Homans, MD while in the presence of Penni Homans, MD.  Penni Homans, MD

## 2022-05-27 DIAGNOSIS — K148 Other diseases of tongue: Secondary | ICD-10-CM | POA: Insufficient documentation

## 2022-05-27 NOTE — Assessment & Plan Note (Signed)
He has noted it in years past but has recently grown some and become irritated. Is referred to ENT for evaluation and  he will discuss with his dentist as well

## 2022-06-22 DIAGNOSIS — F1120 Opioid dependence, uncomplicated: Secondary | ICD-10-CM | POA: Diagnosis not present

## 2022-07-05 DIAGNOSIS — K148 Other diseases of tongue: Secondary | ICD-10-CM | POA: Diagnosis not present

## 2022-07-06 DIAGNOSIS — F1120 Opioid dependence, uncomplicated: Secondary | ICD-10-CM | POA: Diagnosis not present

## 2022-07-20 DIAGNOSIS — F1120 Opioid dependence, uncomplicated: Secondary | ICD-10-CM | POA: Diagnosis not present

## 2022-08-01 DIAGNOSIS — K080 Exfoliation of teeth due to systemic causes: Secondary | ICD-10-CM | POA: Diagnosis not present

## 2022-08-03 DIAGNOSIS — F1120 Opioid dependence, uncomplicated: Secondary | ICD-10-CM | POA: Diagnosis not present

## 2022-08-14 DIAGNOSIS — H43811 Vitreous degeneration, right eye: Secondary | ICD-10-CM | POA: Diagnosis not present

## 2022-08-14 DIAGNOSIS — H2513 Age-related nuclear cataract, bilateral: Secondary | ICD-10-CM | POA: Diagnosis not present

## 2022-08-17 DIAGNOSIS — F1120 Opioid dependence, uncomplicated: Secondary | ICD-10-CM | POA: Diagnosis not present

## 2022-08-24 DIAGNOSIS — F1120 Opioid dependence, uncomplicated: Secondary | ICD-10-CM | POA: Diagnosis not present

## 2022-08-31 DIAGNOSIS — F1120 Opioid dependence, uncomplicated: Secondary | ICD-10-CM | POA: Diagnosis not present

## 2022-09-14 DIAGNOSIS — F1120 Opioid dependence, uncomplicated: Secondary | ICD-10-CM | POA: Diagnosis not present

## 2022-09-21 DIAGNOSIS — F1120 Opioid dependence, uncomplicated: Secondary | ICD-10-CM | POA: Diagnosis not present

## 2022-09-28 DIAGNOSIS — F1120 Opioid dependence, uncomplicated: Secondary | ICD-10-CM | POA: Diagnosis not present

## 2022-10-04 ENCOUNTER — Ambulatory Visit (INDEPENDENT_AMBULATORY_CARE_PROVIDER_SITE_OTHER): Payer: Medicare Other | Admitting: *Deleted

## 2022-10-04 VITALS — BP 133/73 | HR 76 | Ht 72.0 in | Wt 165.6 lb

## 2022-10-04 DIAGNOSIS — Z0000 Encounter for general adult medical examination without abnormal findings: Secondary | ICD-10-CM | POA: Diagnosis not present

## 2022-10-04 NOTE — Patient Instructions (Signed)
Mr. Shaun [name] , Thank you for taking time to come for your Medicare Wellness Visit. I appreciate your ongoing commitment to your health goals. Please review the following plan we discussed and let me know if I can assist you in the future.   These are the goals we discussed:  Goals      Maintain healthy active lifestyle.        This is a list of the screening recommended for you and due dates:  Health Maintenance  Topic Date Due   Zoster (Shingles) Vaccine (1 of 2) Never done   Colon Cancer Screening  08/11/2020   Flu Shot  01/11/2023   Medicare Annual Wellness Visit  10/04/2023   DTaP/Tdap/Td vaccine (3 - Td or Tdap) 09/24/2030   Pneumonia Vaccine  Completed   Hepatitis C Screening: USPSTF Recommendation to screen - Ages 28-79 yo.  Completed   HPV Vaccine  Aged Out   COVID-19 Vaccine  Discontinued     Next appointment: Follow up in one year for your annual wellness visit.   Preventive Care 24 Years and Older, Male Preventive care refers to lifestyle choices and visits with your health care provider that can promote health and wellness. What does preventive care include? A yearly physical exam. This is also called an annual well check. Dental exams once or twice a year. Routine eye exams. Ask your health care provider how often you should have your eyes checked. Personal lifestyle choices, including: Daily care of your teeth and gums. Regular physical activity. Eating a healthy diet. Avoiding tobacco and drug use. Limiting alcohol use. Practicing safe sex. Taking low doses of aspirin every day. Taking vitamin and mineral supplements as recommended by your health care provider. What happens during an annual well check? The services and screenings done by your health care provider during your annual well check will depend on your age, overall health, lifestyle risk factors, and family history of disease. Counseling  Your health care provider may ask you questions about  your: Alcohol use. Tobacco use. Drug use. Emotional well-being. Home and relationship well-being. Sexual activity. Eating habits. History of falls. Memory and ability to understand (cognition). Work and work Astronomer. Screening  You may have the following tests or measurements: Height, weight, and BMI. Blood pressure. Lipid and cholesterol levels. These may be checked every 5 years, or more frequently if you are over 74 years old. Skin check. Lung cancer screening. You may have this screening every year starting at age 68 if you have a 30-pack-year history of smoking and currently smoke or have quit within the past 15 years. Fecal occult blood test (FOBT) of the stool. You may have this test every year starting at age 84. Flexible sigmoidoscopy or colonoscopy. You may have a sigmoidoscopy every 5 years or a colonoscopy every 10 years starting at age 55. Prostate cancer screening. Recommendations will vary depending on your family history and other risks. Hepatitis C blood test. Hepatitis B blood test. Sexually transmitted disease (STD) testing. Diabetes screening. This is done by checking your blood sugar (glucose) after you have not eaten for a while (fasting). You may have this done every 1-3 years. Abdominal aortic aneurysm (AAA) screening. You may need this if you are a current or former smoker. Osteoporosis. You may be screened starting at age 61 if you are at high risk. Talk with your health care provider about your test results, treatment options, and if necessary, the need for more tests. Vaccines  Your health care  provider may recommend certain vaccines, such as: Influenza vaccine. This is recommended every year. Tetanus, diphtheria, and acellular pertussis (Tdap, Td) vaccine. You may need a Td booster every 10 years. Zoster vaccine. You may need this after age 66. Pneumococcal 13-valent conjugate (PCV13) vaccine. One dose is recommended after age 52. Pneumococcal  polysaccharide (PPSV23) vaccine. One dose is recommended after age 40. Talk to your health care provider about which screenings and vaccines you need and how often you need them. This information is not intended to replace advice given to you by your health care provider. Make sure you discuss any questions you have with your health care provider. Document Released: 06/25/2015 Document Revised: 02/16/2016 Document Reviewed: 03/30/2015 Elsevier Interactive Patient Education  2017 [place] Prevention in the Home Falls can cause injuries. They can happen to people of all ages. There are many things you can do to make your home safe and to help prevent falls. What can I do on the outside of my home? Regularly fix the edges of walkways and driveways and fix any cracks. Remove anything that might make you trip as you walk through a door, such as a raised step or threshold. Trim any bushes or trees on the path to your home. Use bright outdoor lighting. Clear any walking paths of anything that might make someone trip, such as rocks or tools. Regularly check to see if handrails are loose or broken. Make sure that both sides of any steps have handrails. Any raised decks and porches should have guardrails on the edges. Have any leaves, snow, or ice cleared regularly. Use sand or salt on walking paths during winter. Clean up any spills in your garage right away. This includes oil or grease spills. What can I do in the bathroom? Use night lights. Install grab bars by the toilet and in the tub and shower. Do not use towel bars as grab bars. Use non-skid mats or decals in the tub or shower. If you need to sit down in the shower, use a plastic, non-slip stool. Keep the floor dry. Clean up any water that spills on the floor as soon as it happens. Remove soap buildup in the tub or shower regularly. Attach bath mats securely with double-sided non-slip rug tape. Do not have throw rugs and other  things on the floor that can make you trip. What can I do in the bedroom? Use night lights. Make sure that you have a light by your bed that is easy to reach. Do not use any sheets or blankets that are too big for your bed. They should not hang down onto the floor. Have a firm chair that has side arms. You can use this for support while you get dressed. Do not have throw rugs and other things on the floor that can make you trip. What can I do in the kitchen? Clean up any spills right away. Avoid walking on wet floors. Keep items that you use a lot in easy-to-reach places. If you need to reach something above you, use a strong step stool that has a grab bar. Keep electrical cords out of the way. Do not use floor polish or wax that makes floors slippery. If you must use wax, use non-skid floor wax. Do not have throw rugs and other things on the floor that can make you trip. What can I do with my stairs? Do not leave any items on the stairs. Make sure that there are handrails on both  sides of the stairs and use them. Fix handrails that are broken or loose. Make sure that handrails are as long as the stairways. Check any carpeting to make sure that it is firmly attached to the stairs. Fix any carpet that is loose or worn. Avoid having throw rugs at the top or bottom of the stairs. If you do have throw rugs, attach them to the floor with carpet tape. Make sure that you have a light switch at the top of the stairs and the bottom of the stairs. If you do not have them, ask someone to add them for you. What else can I do to help prevent falls? Wear shoes that: Do not have high heels. Have rubber bottoms. Are comfortable and fit you well. Are closed at the toe. Do not wear sandals. If you use a stepladder: Make sure that it is fully opened. Do not climb a closed stepladder. Make sure that both sides of the stepladder are locked into place. Ask someone to hold it for you, if possible. Clearly  mark and make sure that you can see: Any grab bars or handrails. First and last steps. Where the edge of each step is. Use tools that help you move around (mobility aids) if they are needed. These include: Canes. Walkers. Scooters. Crutches. Turn on the lights when you go into a dark area. Replace any light bulbs as soon as they burn out. Set up your furniture so you have a clear path. Avoid moving your furniture around. If any of your floors are uneven, fix them. If there are any pets around you, be aware of where they are. Review your medicines with your doctor. Some medicines can make you feel dizzy. This can increase your chance of falling. Ask your doctor what other things that you can do to help prevent falls. This information is not intended to replace advice given to you by your health care provider. Make sure you discuss any questions you have with your health care provider. Document Released: 03/25/2009 Document Revised: 11/04/2015 Document Reviewed: 07/03/2014 Elsevier Interactive Patient Education  2017 [employer].

## 2022-10-04 NOTE — Progress Notes (Signed)
Subjective:   Shaun [name] is a 72 y.o. male who presents for Medicare Annual/Subsequent preventive examination.  Review of Systems     Cardiac Risk Factors include: advanced age (>2men, >69 women);hypertension;male gender     Objective:    Today's Vitals   10/04/22 0909  BP: 133/73  Pulse: 76  Weight: 165 lb 9.6 oz (75.1 kg)  Height: 6' (1.829 m)   Body mass index is 22.46 kg/m.     10/04/2022    9:10 AM 10/03/2021    9:05 AM 09/23/2020    8:53 AM 09/23/2019    8:05 AM 01/27/2016   11:26 AM 07/29/2015    9:59 AM 07/04/2012   12:57 PM  Advanced Directives  Does Patient Have a Medical Advance Directive? Yes Yes Yes Yes  Yes Patient does not have advance directive  Type of Advance Directive Healthcare Power of [place];Living will Healthcare Power of [place];Out of facility DNR (pink MOST or yellow form);Living will Healthcare Power of [place];Living will Healthcare Power of [place];Living will Healthcare Power of [place];Living will Healthcare Power of [place];Living will   Does patient want to make changes to medical advance directive? No - Patient declined   No - Patient declined No - Patient declined    Copy of Healthcare Power of Attorney in Chart? Yes - validated most recent copy scanned in chart (See row information) No - copy requested No - copy requested No - copy requested No - copy requested No - copy requested   Pre-existing out of facility DNR order (yellow form or pink MOST form)       No    Current Medications (verified) Outpatient Encounter Medications as of 10/04/2022  Medication Sig   fluocinonide gel (LIDEX) 0.05 % APPLY TO TONGUE TWICE DAILY AS NEEDED   lisinopril (ZESTRIL) 10 MG tablet Take 1 tablet (10 mg total) by mouth daily.   magic mouthwash SOLN Take 5 mLs by mouth 2 (two) times daily.   magic mouthwash SOLN Take 5 mLs by mouth 4 (four) times daily as needed for mouth pain. Swish and spit   methadone (DOLOPHINE) 10 MG/5ML solution Take 8 mg  by mouth every morning.   Multiple Vitamin (MULTIVITAMIN) tablet Take 1 tablet by mouth every morning.    tiZANidine (ZANAFLEX) 4 MG tablet Take 0.5-1 tablets (2-4 mg total) by mouth 2 (two) times daily as needed for muscle spasms.   No facility-administered encounter medications on file as of 10/04/2022.    Allergies (verified) Tomato   History: Past Medical History:  Diagnosis Date   Allergic state 01/30/2017   Arm mass    Arm skin lesion, left 11/12/2012   And sun damaged skin   Elevated BP 11/14/2012   Encounter for Medicare annual wellness exam 01/27/2016   GERD (gastroesophageal reflux disease) 01/27/2016   Hepatitis C, chronic    History of intravenous drug use in remission    History of oral lesions 05/14/2013   History of oral lesions 07/29/2015   Hypertension 11/14/2012   Left shoulder pain 12/16/2012   Methadone use 01/31/2015   Myalgia and myositis 08/13/2014   Preventative health care 05/14/2013   Preventative health care 05/14/2013   Past Surgical History:  Procedure Laterality Date   KNEE ARTHROSCOPY  1996   ligment repair   Family History  Problem Relation Age of Onset   Heart disease Father    Diabetes Father    Hyperlipidemia Father    Hypertension Father    Thyroid disease Sister  Heart disease Cousin        MI   Diabetes Cousin    Obesity Cousin    Osteoporosis Mother    Diabetes Paternal Grandfather    Thyroid disease Maternal Grandmother    Cancer Neg Hx        negative for colon and prostate   Social History   Socioeconomic History   Marital status: Married    Spouse name: Not on file   Number of children: Not on file   Years of education: Not on file   Highest education level: Not on file  Occupational History   Not on file  Tobacco Use   Smoking status: Never   Smokeless tobacco: Never  Vaping Use   Vaping Use: Never used  Substance and Sexual Activity   Alcohol use: No    Alcohol/week: 0.0 standard drinks of alcohol   Drug use: No    Sexual activity: Yes    Comment: avoids nuts, fruits, lives with wife, makes picture frames, building maintenance  Other Topics Concern   Not on file  Social History Narrative   Not on file   Social Determinants of Health   Financial Resource Strain: Low Risk  (10/03/2021)   Overall Financial Resource Strain (CARDIA)    Difficulty of Paying Living Expenses: Not hard at all  Food Insecurity: No Food Insecurity (10/04/2022)   Hunger Vital Sign    Worried About Running Out of Food in the Last Year: Never true    Ran Out of Food in the Last Year: Never true  Transportation Needs: No Transportation Needs (10/04/2022)   PRAPARE - Administrator, Civil Service (Medical): No    Lack of Transportation (Non-Medical): No  Physical Activity: Sufficiently Active (10/03/2021)   Exercise Vital Sign    Days of Exercise per Week: 7 days    Minutes of Exercise per Session: 30 min  Stress: No Stress Concern Present (10/03/2021)   [employer] of Occupational Health - Occupational Stress Questionnaire    Feeling of Stress : Only a little  Social Connections: Moderately Isolated (10/03/2021)   Social Connection and Isolation Panel [NHANES]    Frequency of Communication with Friends and Family: More than three times a week    Frequency of Social Gatherings with Friends and Family: More than three times a week    Attends Religious Services: Never    Database administrator or Organizations: No    Attends Engineer, structural: Never    Marital Status: Married    Tobacco Counseling Counseling given: Not Answered   Clinical Intake:  Pre-visit preparation completed: Yes  Pain : No/denies pain  BMI - recorded: 22.46 Nutritional Status: BMI of 19-24  Normal Nutritional Risks: None Diabetes: No  How often do you need to have someone help you when you read instructions, pamphlets, or other written materials from your doctor or pharmacy?: 1 - Never  Activities of Daily  Living    10/04/2022    9:14 AM  In your present state of health, do you have any difficulty performing the following activities:  Hearing? 1  Comment has noticed some slight hearing loss  Vision? 0  Difficulty concentrating or making decisions? 0  Walking or climbing stairs? 0  Dressing or bathing? 0  Doing errands, shopping? 0  Preparing Food and eating ? N  Using the Toilet? N  In the past six months, have you accidently leaked urine? N  Do you have problems with  loss of bowel control? N  Managing your Medications? N  Managing your Finances? N  Housekeeping or managing your Housekeeping? N    Patient Care Team: Bradd Canary, MD as PCP - General (Family Medicine)  Indicate any recent Medical Services you may have received from other than [facility] providers in the past year (date may be approximate).     Assessment:   This is a routine wellness examination for Ghazi.  Hearing/Vision screen No results found.  Dietary issues and exercise activities discussed: Current Exercise Habits: Home exercise routine, Type of exercise: walking, Time (Minutes): 15, Frequency (Times/Week): 7, Weekly Exercise (Minutes/Week): 105, Intensity: Mild, Exercise limited by: None identified   Goals Addressed   None    Depression Screen    10/04/2022    9:13 AM 05/25/2022    9:11 AM 11/22/2021    8:44 AM 10/03/2021    9:06 AM 09/23/2020   10:49 AM 09/23/2020    8:55 AM 09/25/2019    3:41 PM  PHQ 2/9 Scores  PHQ - 2 Score 0 0 0 0 0 0 0  PHQ- 9 Score     0  2    Fall Risk    10/04/2022    9:12 AM 05/25/2022    9:11 AM 11/22/2021    8:44 AM 10/03/2021    9:06 AM 09/23/2020    8:55 AM  Fall Risk   Falls in the past year? 1 0 0 0 0  Comment tripped over a rock in the garden      Number falls in past yr: 0 0 0 0 0  Injury with Fall? 0 0 0 0 0  Risk for fall due to : No Fall Risks  No Fall Risks No Fall Risks   Follow up Falls evaluation completed Falls evaluation completed Falls evaluation  completed Falls evaluation completed Falls prevention discussed    FALL RISK PREVENTION PERTAINING TO THE HOME:  Any stairs in or around the home? No  Home free of loose throw rugs in walkways, pet beds, electrical cords, etc? Yes  Adequate lighting in your home to reduce risk of falls? Yes   ASSISTIVE DEVICES UTILIZED TO PREVENT FALLS:  Life alert? No  Use of a cane, walker or w/c? No  Grab bars in the bathroom? Yes  Shower chair or bench in shower?  Has a shower chair but he does not use it Elevated toilet seat or a handicapped toilet? No   TIMED UP AND GO:  Was the test performed? Yes .  Length of time to ambulate 10 feet: 6 sec.   Gait steady and fast without use of assistive device  Cognitive Function:        10/04/2022    9:19 AM 10/03/2021    9:11 AM  6CIT Screen  What Year? 0 points 0 points  What month? 0 points 0 points  What time? 0 points 0 points  Count back from 20 0 points 0 points  Months in reverse 0 points 2 points  Repeat phrase 0 points 0 points  Total Score 0 points 2 points    Immunizations Immunization History  Administered Date(s) Administered   Hepatitis B, ADULT 05/01/2016, 06/01/2016, 10/31/2016   Influenza Split 05/16/2011, 05/14/2012   Influenza Whole 03/26/2009   Influenza, High Dose Seasonal PF 05/01/2016, 08/02/2017, 05/20/2018, 04/14/2019   Influenza,inj,Quad PF,6+ Mos 05/13/2013, 07/21/2014, 07/29/2015   Pneumococcal Conjugate-13 07/29/2015   Pneumococcal Polysaccharide-23 07/20/2008, 01/30/2017   Td 03/26/2009   Tdap 09/23/2020  Zoster, Live 05/16/2011    TDAP status: Up to date  Flu Vaccine status: Up to date  Pneumococcal vaccine status: Up to date  Covid-19 vaccine status: Declined, Education has been provided regarding the importance of this vaccine but patient still declined. Advised may receive this vaccine at local pharmacy or Health Dept.or vaccine clinic. Aware to provide a copy of the vaccination record if  obtained from local pharmacy or Health Dept. Verbalized acceptance and understanding.  Qualifies for Shingles Vaccine? Yes   Zostavax completed Yes   Shingrix Completed?: No.    Education has been provided regarding the importance of this vaccine. Patient has been advised to call insurance company to determine out of pocket expense if they have not yet received this vaccine. Advised may also receive vaccine at local pharmacy or Health Dept. Verbalized acceptance and understanding.  Screening Tests Health Maintenance  Topic Date Due   Zoster Vaccines- Shingrix (1 of 2) Never done   COLONOSCOPY (Pts 45-59yrs Insurance coverage will need to be confirmed)  08/11/2020   Medicare Annual Wellness (AWV)  10/04/2022   INFLUENZA VACCINE  01/11/2023   DTaP/Tdap/Td (3 - Td or Tdap) 09/24/2030   Pneumonia Vaccine 64+ Years old  Completed   Hepatitis C Screening  Completed   HPV VACCINES  Aged Out   COVID-19 Vaccine  Discontinued    Health Maintenance  Health Maintenance Due  Topic Date Due   Zoster Vaccines- Shingrix (1 of 2) Never done   COLONOSCOPY (Pts 45-69yrs Insurance coverage will need to be confirmed)  08/11/2020   Medicare Annual Wellness (AWV)  10/04/2022    Colorectal Cancer screening: Pt declined at this time.  Does not have driver right now.  Lung Cancer Screening: (Low Dose CT Chest recommended if Age 71-80 years, 30 pack-year currently smoking OR have quit w/in 15years.) does not qualify.   Additional Screening:  Hepatitis C Screening: does qualify; Completed 09/22/20  Vision Screening: Recommended annual ophthalmology exams for early detection of glaucoma and other disorders of the eye. Is the patient up to date with their annual eye exam?  Yes  Who is the provider or what is the name of the office in which the patient attends annual eye exams? Dr. Sallyanne Kuster If pt is not established with a provider, would they like to be referred to a provider to establish care? No .    Dental Screening: Recommended annual dental exams for proper oral hygiene  Community Resource Referral / Chronic Care Management: CRR required this visit?  No   CCM required this visit?  No      Plan:     I have personally reviewed and noted the following in the patient's chart:   Medical and social history Use of alcohol, tobacco or illicit drugs  Current medications and supplements including opioid prescriptions. Patient is not currently taking opioid prescriptions. Functional ability and status Nutritional status Physical activity Advanced directives List of other physicians Hospitalizations, surgeries, and ER visits in previous 12 months Vitals Screenings to include cognitive, depression, and falls Referrals and appointments  In addition, I have reviewed and discussed with patient certain preventive protocols, quality metrics, and best practice recommendations. A written personalized care plan for preventive services as well as general preventive health recommendations were provided to patient.    Donne Anon, [place]   10/04/2022   Nurse Notes: None

## 2022-10-05 ENCOUNTER — Ambulatory Visit: Payer: Medicare Other | Admitting: Family Medicine

## 2022-10-12 DIAGNOSIS — F1120 Opioid dependence, uncomplicated: Secondary | ICD-10-CM | POA: Diagnosis not present

## 2022-10-22 DIAGNOSIS — F1120 Opioid dependence, uncomplicated: Secondary | ICD-10-CM | POA: Diagnosis not present

## 2022-10-26 NOTE — Progress Notes (Signed)
[facility] Quality Team Note  Name: Phalen Bonnar Date of Birth: 17-May-1951 MRN: [id number] Date: 10/26/2022  [facility] Quality Team has reviewed this patient's chart, please see recommendations below:  [facility] Quality Other; (PATIENT DUE FOR CBP AND COLON CANCER SCREENING, PLEASE ADDRESS AT NEXT OFFICE VISIT 11/30/2022)

## 2022-11-05 DIAGNOSIS — F1120 Opioid dependence, uncomplicated: Secondary | ICD-10-CM | POA: Diagnosis not present

## 2022-11-14 ENCOUNTER — Other Ambulatory Visit: Payer: Self-pay | Admitting: Family Medicine

## 2022-11-19 DIAGNOSIS — F1120 Opioid dependence, uncomplicated: Secondary | ICD-10-CM | POA: Diagnosis not present

## 2022-11-23 DIAGNOSIS — F1120 Opioid dependence, uncomplicated: Secondary | ICD-10-CM | POA: Diagnosis not present

## 2022-11-29 NOTE — Assessment & Plan Note (Signed)
Well controlled, no changes to meds. Encouraged heart healthy diet such as the DASH diet and exercise as tolerated.  °

## 2022-11-29 NOTE — Assessment & Plan Note (Addendum)
Patient encouraged to maintain heart healthy diet, regular exercise, adequate sleep. Consider daily probiotics. Take medications as prescribed. Labs ordered and reviewed. He will consider a colonoscopy in the near future. No bowel changes. Labs ordered and reviewed. Consider RSV and Shingrix vaccines and annual fall covid and flu boosters

## 2022-11-29 NOTE — Assessment & Plan Note (Signed)
hgba1c acceptable, minimize simple carbs. Increase exercise as tolerated.  

## 2022-11-29 NOTE — Assessment & Plan Note (Signed)
Encourage heart healthy diet such as MIND or DASH diet, increase exercise, avoid trans fats, simple carbohydrates and processed foods, consider a krill or fish or flaxseed oil cap daily.  °

## 2022-11-29 NOTE — Assessment & Plan Note (Signed)
Hydrate and monitor 

## 2022-11-30 ENCOUNTER — Ambulatory Visit (HOSPITAL_BASED_OUTPATIENT_CLINIC_OR_DEPARTMENT_OTHER)
Admission: RE | Admit: 2022-11-30 | Discharge: 2022-11-30 | Disposition: A | Discharge status: Home / Self Care | Payer: Medicare Other | Source: Ambulatory Visit | Attending: Family Medicine | Admitting: Family Medicine

## 2022-11-30 ENCOUNTER — Ambulatory Visit (INDEPENDENT_AMBULATORY_CARE_PROVIDER_SITE_OTHER): Payer: Medicare Other | Admitting: Family Medicine

## 2022-11-30 VITALS — BP 122/70 | HR 65 | Temp 98.0°F | Resp 16 | Ht 72.0 in | Wt 164.6 lb

## 2022-11-30 DIAGNOSIS — R739 Hyperglycemia, unspecified: Secondary | ICD-10-CM

## 2022-11-30 DIAGNOSIS — M791 Myalgia, unspecified site: Secondary | ICD-10-CM | POA: Diagnosis not present

## 2022-11-30 DIAGNOSIS — Z0000 Encounter for general adult medical examination without abnormal findings: Secondary | ICD-10-CM

## 2022-11-30 DIAGNOSIS — F1190 Opioid use, unspecified, uncomplicated: Secondary | ICD-10-CM

## 2022-11-30 DIAGNOSIS — R351 Nocturia: Secondary | ICD-10-CM

## 2022-11-30 DIAGNOSIS — M545 Low back pain, unspecified: Secondary | ICD-10-CM | POA: Insufficient documentation

## 2022-11-30 DIAGNOSIS — K1379 Other lesions of oral mucosa: Secondary | ICD-10-CM

## 2022-11-30 DIAGNOSIS — E785 Hyperlipidemia, unspecified: Secondary | ICD-10-CM | POA: Diagnosis not present

## 2022-11-30 DIAGNOSIS — M5136 Other intervertebral disc degeneration, lumbar region: Secondary | ICD-10-CM | POA: Diagnosis not present

## 2022-11-30 DIAGNOSIS — I10 Essential (primary) hypertension: Secondary | ICD-10-CM

## 2022-11-30 LAB — CBC WITH DIFFERENTIAL/PLATELET
Basophils Absolute: 0 10*3/uL (ref 0.0–0.1)
Basophils Relative: 0.7 % (ref 0.0–3.0)
Eosinophils Absolute: 0.1 10*3/uL (ref 0.0–0.7)
Eosinophils Relative: 2.5 % (ref 0.0–5.0)
HCT: 42.2 % (ref 39.0–52.0)
Hemoglobin: 13.9 g/dL (ref 13.0–17.0)
Lymphocytes Relative: 23.7 % (ref 12.0–46.0)
Lymphs Abs: 0.9 10*3/uL (ref 0.7–4.0)
MCHC: 32.9 g/dL (ref 30.0–36.0)
MCV: 91 fl (ref 78.0–100.0)
Monocytes Absolute: 0.3 10*3/uL (ref 0.1–1.0)
Monocytes Relative: 8.3 % (ref 3.0–12.0)
Neutro Abs: 2.4 10*3/uL (ref 1.4–7.7)
Neutrophils Relative %: 64.8 % (ref 43.0–77.0)
Platelets: 159 10*3/uL (ref 150.0–400.0)
RBC: 4.63 Mil/uL (ref 4.22–5.81)
RDW: 12.9 % (ref 11.5–15.5)
WBC: 3.7 10*3/uL — ABNORMAL LOW (ref 4.0–10.5)

## 2022-11-30 LAB — COMPREHENSIVE METABOLIC PANEL
ALT: 15 U/L (ref 0–53)
AST: 18 U/L (ref 0–37)
Albumin: 4.7 g/dL (ref 3.5–5.2)
Alkaline Phosphatase: 62 U/L (ref 39–117)
BUN: 20 mg/dL (ref 6–23)
CO2: 31 mEq/L (ref 19–32)
Calcium: 9.7 mg/dL (ref 8.4–10.5)
Chloride: 98 mEq/L (ref 96–112)
Creatinine, Ser: 1.25 mg/dL (ref 0.40–1.50)
GFR: 57.23 mL/min — ABNORMAL LOW (ref >60.00)
Glucose, Bld: 107 mg/dL — ABNORMAL HIGH (ref 70–99)
Potassium: 4.7 mEq/L (ref 3.5–5.1)
Sodium: 138 mEq/L (ref 135–145)
Total Bilirubin: 0.9 mg/dL (ref 0.2–1.2)
Total Protein: 7.1 g/dL (ref 6.0–8.3)

## 2022-11-30 LAB — LIPID PANEL
Cholesterol: 130 mg/dL (ref 0–200)
HDL: 51.2 mg/dL (ref >39.00)
LDL Cholesterol: 64 mg/dL (ref 0–99)
NonHDL: 79.26
Total CHOL/HDL Ratio: 3
Triglycerides: 74 mg/dL (ref 0.0–149.0)
VLDL: 14.8 mg/dL (ref 0.0–40.0)

## 2022-11-30 LAB — TSH: TSH: 1.25 u[IU]/mL (ref 0.35–5.50)

## 2022-11-30 LAB — MAGNESIUM: Magnesium: 1.9 mg/dL (ref 1.5–2.5)

## 2022-11-30 LAB — PSA: PSA: 1.02 ng/mL (ref 0.10–4.00)

## 2022-11-30 LAB — HEMOGLOBIN A1C: Hgb A1c MFr Bld: 5.9 % (ref 4.6–6.5)

## 2022-11-30 MED ORDER — MAGIC MOUTHWASH: 5.0000 mL | Freq: Four times a day (QID) | ORAL | 1 refills | Status: DC | PRN

## 2022-11-30 NOTE — Patient Instructions (Addendum)
Consider RSV and Shingrix vaccines and annual fall covid and flu boosters. RSV, Respiratory Syncitial Virus Vaccine, Arexvy at pharmacy Shingrix is the new shingles shot, 2 shots over 2-6 months, confirm coverage with insurance and document, then can return here for shots with nurse appt or at pharmacy   Preventive Care 65 Years and Older, Male Preventive care refers to lifestyle choices and visits with your health care provider that can promote health and wellness. Preventive care visits are also called wellness exams. What can I expect for my preventive care visit? Counseling During your preventive care visit, your health care provider may ask about your: Medical history, including: Past medical problems. Family medical history. History of falls. Current health, including: Emotional well-being. Home life and relationship well-being. Sexual activity. Memory and ability to understand (cognition). Lifestyle, including: Alcohol, nicotine or tobacco, and drug use. Access to firearms. Diet, exercise, and sleep habits. Work and work Astronomer. Sunscreen use. Safety issues such as seatbelt and bike helmet use. Physical exam Your health care provider will check your: Height and weight. These may be used to calculate your BMI (body mass index). BMI is a measurement that tells if you are at a healthy weight. Waist circumference. This measures the distance around your waistline. This measurement also tells if you are at a healthy weight and may help predict your risk of certain diseases, such as type 2 diabetes and high blood pressure. Heart rate and blood pressure. Body temperature. Skin for abnormal spots. What immunizations do I need?  Vaccines are usually given at various ages, according to a schedule. Your health care provider will recommend vaccines for you based on your age, medical history, and lifestyle or other factors, such as travel or where you work. What tests do I  need? Screening Your health care provider may recommend screening tests for certain conditions. This may include: Lipid and cholesterol levels. Diabetes screening. This is done by checking your blood sugar (glucose) after you have not eaten for a while (fasting). Hepatitis C test. Hepatitis B test. HIV (human immunodeficiency virus) test. STI (sexually transmitted infection) testing, if you are at risk. Lung cancer screening. Colorectal cancer screening. Prostate cancer screening. Abdominal aortic aneurysm (AAA) screening. You may need this if you are a current or former smoker. Talk with your health care provider about your test results, treatment options, and if necessary, the need for more tests. Follow these instructions at home: Eating and drinking  Eat a diet that includes fresh fruits and vegetables, whole grains, lean protein, and low-fat dairy products. Limit your intake of foods with high amounts of sugar, saturated fats, and salt. Take vitamin and mineral supplements as recommended by your health care provider. Do not drink alcohol if your health care provider tells you not to drink. If you drink alcohol: Limit how much you have to 0-2 drinks a day. Know how much alcohol is in your drink. In the [place] one drink equals one 12 oz bottle of beer (355 mL), one 5 oz glass of wine (148 mL), or one 1 oz glass of hard liquor (44 mL). Lifestyle Brush your teeth every morning and night with fluoride toothpaste. Floss one time each day. Exercise for at least 30 minutes 5 or more days each week. Do not use any products that contain nicotine or tobacco. These products include cigarettes, chewing tobacco, and vaping devices, such as e-cigarettes. If you need help quitting, ask your health care provider. Do not use drugs. If you are sexually active,  practice safe sex. Use a condom or other form of protection to prevent STIs. Take aspirin only as told by your health care provider. Make sure  that you understand how much to take and what form to take. Work with your health care provider to find out whether it is safe and beneficial for you to take aspirin daily. Ask your health care provider if you need to take a cholesterol-lowering medicine (statin). Find healthy ways to manage stress, such as: Meditation, yoga, or listening to music. Journaling. Talking to a trusted person. Spending time with friends and family. Safety Always wear your seat belt while driving or riding in a vehicle. Do not drive: If you have been drinking alcohol. Do not ride with someone who has been drinking. When you are tired or distracted. While texting. If you have been using any mind-altering substances or drugs. Wear a helmet and other protective equipment during sports activities. If you have firearms in your house, make sure you follow all gun safety procedures. Minimize exposure to UV radiation to reduce your risk of skin cancer. What's next? Visit your health care provider once a year for an annual wellness visit. Ask your health care provider how often you should have your eyes and teeth checked. Stay up to date on all vaccines. This information is not intended to replace advice given to you by your health care provider. Make sure you discuss any questions you have with your health care provider. Document Revised: 11/24/2020 Document Reviewed: 11/24/2020 Elsevier Patient Education  2024 [employer].

## 2022-11-30 NOTE — Assessment & Plan Note (Signed)
Stable but does struggles with back pain still. Encouraged moist heat and gentle stretching as tolerated. May try NSAIDs and prescription meds as directed and report if symptoms worsen or seek immediate care

## 2022-11-30 NOTE — Progress Notes (Signed)
Subjective:    Patient ID: Shaun [name], male    DOB: 04/05/1951, 72 y.o.   MRN: [id number]  Chief Complaint  Patient presents with  . Annual Exam    Annual Exam    HPI Discussed the use of AI scribe software for clinical note transcription with the patient, who gave verbal consent to proceed.  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 He reports intermittent exacerbations of his back pain, which occasionally radiates to his legs causing numbness. The pain and numbness do not persist and tend to resolve when he is careful with his movements. He has not sought recent imaging or specialist consultation for this issue, with the last imaging being done approximately seven years ago.  The patient has been under increased stress due to family health issues. His 28 year old mother recently fell and broke her ankle, requiring him to assist with her care. His wife has also been unwell, with a recent emergency room visit due to pain, vomiting, and urinary retention. Despite these challenges, the patient continues to work and maintain his physical activity, walking his dog daily. He also reports good hydration habits.  Denies CP/palp/SOB/HA/congestion/fevers/GI or GU c/o. Taking meds as prescribed        Past Medical History:  Diagnosis Date  . Allergic state 01/30/2017  . Arm mass   . Arm skin lesion, left 11/12/2012   And sun damaged skin  . Elevated BP 11/14/2012  . Encounter for Medicare annual wellness exam 01/27/2016  . GERD (gastroesophageal reflux disease) 01/27/2016  . Hepatitis C, chronic (HCC)   . History of intravenous drug use in remission   . History of oral lesions 05/14/2013  . History of oral lesions 07/29/2015  . Hypertension 11/14/2012  . Left shoulder pain 12/16/2012  . Methadone use 01/31/2015  . Myalgia and myositis 08/13/2014  . Preventative  health care 05/14/2013  . Preventative health care 05/14/2013    Past Surgical History:  Procedure Laterality Date  . KNEE ARTHROSCOPY  1996   ligment repair    Family History  Problem Relation Age of Onset  . Heart disease Father   . Diabetes Father   . Hyperlipidemia Father   . Hypertension Father   . Thyroid disease Sister   . Heart disease Cousin        MI  . Diabetes Cousin   . Obesity Cousin   . Osteoporosis Mother   . Diabetes Paternal Grandfather   . Thyroid disease Maternal Grandmother   . Cancer Neg Hx        negative for colon and prostate    Social History   Socioeconomic History  . Marital status: Married    Spouse name: Not on file  . Number of children: Not on file  . Years of education: Not on file  . Highest education level: Not on file  Occupational History  . Not on file  Tobacco Use  . Smoking status: Never  . Smokeless tobacco: Never  Vaping Use  . Vaping Use: Never used  Substance and Sexual Activity  . Alcohol use: No    Alcohol/week: 0.0 standard drinks of alcohol  . Drug use: No  . Sexual activity: Yes    Comment: avoids nuts, fruits, lives with wife, makes picture frames, building maintenance  Other Topics Concern  . Not on file  Social History  Narrative  . Not on file   Social Determinants of Health   Financial Resource Strain: Low Risk  (10/03/2021)   Overall Financial Resource Strain (CARDIA)   . Difficulty of Paying Living Expenses: Not hard at all  Food Insecurity: No Food Insecurity (10/04/2022)   Hunger Vital Sign   . Worried About Programme researcher, broadcasting/film/video in the Last Year: Never true   . Ran Out of Food in the Last Year: Never true  Transportation Needs: No Transportation Needs (10/04/2022)   PRAPARE - Transportation   . Lack of Transportation (Medical): No   . Lack of Transportation (Non-Medical): No  Physical Activity: Sufficiently Active (10/03/2021)   Exercise Vital Sign   . Days of Exercise per Week: 7 days   . Minutes  of Exercise per Session: 30 min  Stress: No Stress Concern Present (10/03/2021)   [employer] of Occupational Health - Occupational Stress Questionnaire   . Feeling of Stress : Only a little  Social Connections: Moderately Isolated (10/03/2021)   Social Connection and Isolation Panel [NHANES]   . Frequency of Communication with Friends and Family: More than three times a week   . Frequency of Social Gatherings with Friends and Family: More than three times a week   . Attends Religious Services: Never   . Active Member of Clubs or Organizations: No   . Attends Banker Meetings: Never   . Marital Status: Married  Catering manager Violence: Not At Risk (10/04/2022)   Humiliation, Afraid, Rape, and Kick questionnaire   . Fear of Current or Ex-Partner: No   . Emotionally Abused: No   . Physically Abused: No   . Sexually Abused: No    Outpatient Medications Prior to Visit  Medication Sig Dispense Refill  . fluocinonide gel (LIDEX) 0.05 % APPLY TO TONGUE TWICE DAILY AS NEEDED 60 g 0  . lisinopril (ZESTRIL) 10 MG tablet TAKE ONE TABLET BY MOUTH ONCE DAILY 90 tablet 1  . magic mouthwash SOLN Take 5 mLs by mouth 2 (two) times daily. 240 mL 1  . methadone (DOLOPHINE) 10 MG/5ML solution Take 8 mg by mouth every morning.    . Multiple Vitamin (MULTIVITAMIN) tablet Take 1 tablet by mouth every morning.     Marland Kitchen tiZANidine (ZANAFLEX) 4 MG tablet Take 0.5-1 tablets (2-4 mg total) by mouth 2 (two) times daily as needed for muscle spasms. 30 tablet 3  . magic mouthwash SOLN Take 5 mLs by mouth 4 (four) times daily as needed for mouth pain. Swish and spit 240 mL 1   No facility-administered medications prior to visit.    Allergies  Allergen Reactions  . Tomato Other (See Comments)    Mouth     Review of Systems  Constitutional:  Negative for chills, fever and malaise/fatigue.  HENT:  Negative for congestion and hearing loss.   Eyes:  Negative for discharge.  Respiratory:   Negative for cough, sputum production and shortness of breath.   Cardiovascular:  Negative for chest pain, palpitations and leg swelling.  Gastrointestinal:  Negative for abdominal pain, blood in stool, constipation, diarrhea, heartburn, nausea and vomiting.  Genitourinary:  Negative for dysuria, frequency, hematuria and urgency.  Musculoskeletal:  Positive for back pain. Negative for falls and myalgias.  Skin:  Negative for rash.  Neurological:  Negative for dizziness, sensory change, loss of consciousness, weakness and headaches.  Endo/Heme/Allergies:  Negative for environmental allergies. Does not bruise/bleed easily.  Psychiatric/Behavioral:  Negative for depression and suicidal ideas. The patient  is not nervous/anxious and does not have insomnia.       Objective:    Physical Exam Vitals reviewed.  Constitutional:      General: He is not in acute distress.    Appearance: Normal appearance. He is not ill-appearing or diaphoretic.  HENT:     Head: Normocephalic and atraumatic.     Right Ear: Tympanic membrane, ear canal and external ear normal. There is no impacted cerumen.     Left Ear: Tympanic membrane, ear canal and external ear normal. There is no impacted cerumen.     Nose: Nose normal. No rhinorrhea.     Mouth/Throat:     Pharynx: Oropharynx is clear.  Eyes:     General: No scleral icterus.    Extraocular Movements: Extraocular movements intact.     Conjunctiva/sclera: Conjunctivae normal.     Pupils: Pupils are equal, round, and reactive to light.  Neck:     Thyroid: No thyroid mass or thyroid tenderness.  Cardiovascular:     Rate and Rhythm: Normal rate and regular rhythm.     Pulses: Normal pulses.     Heart sounds: Normal heart sounds. No murmur heard. Pulmonary:     Effort: Pulmonary effort is normal.     Breath sounds: Normal breath sounds. No wheezing.  Abdominal:     General: Bowel sounds are normal.     Palpations: Abdomen is soft. There is no mass.      Tenderness: There is no guarding.  Musculoskeletal:        General: No swelling. Normal range of motion.     Cervical back: Normal range of motion and neck supple. No rigidity.     Right lower leg: No edema.     Left lower leg: No edema.  Lymphadenopathy:     Cervical: No cervical adenopathy.  Skin:    General: Skin is warm and dry.     Findings: No rash.  Neurological:     General: No focal deficit present.     Mental Status: He is alert and oriented to person, place, and time.     Cranial Nerves: No cranial nerve deficit.     Deep Tendon Reflexes: Reflexes normal.  Psychiatric:        Mood and Affect: Mood normal.        Behavior: Behavior normal.   BP 122/70 (BP Location: Left Arm, Patient Position: Sitting, Cuff Size: Normal)   Pulse 65   Temp 98 F (36.7 C) (Oral)   Resp 16   Ht 6' (1.829 m)   Wt 164 lb 9.6 oz (74.7 kg)   SpO2 97%   BMI 22.32 kg/m  Wt Readings from Last 3 Encounters:  11/30/22 164 lb 9.6 oz (74.7 kg)  10/04/22 165 lb 9.6 oz (75.1 kg)  05/25/22 164 lb (74.4 kg)    Diabetic Foot Exam - Simple   No data filed    Lab Results  Component Value Date   WBC 3.7 (L) 11/30/2022   HGB 13.9 11/30/2022   HCT 42.2 11/30/2022   PLT 159.0 11/30/2022   GLUCOSE 107 (H) 11/30/2022   CHOL 130 11/30/2022   TRIG 74.0 11/30/2022   HDL 51.20 11/30/2022   LDLCALC 64 11/30/2022   ALT 15 11/30/2022   AST 18 11/30/2022   NA 138 11/30/2022   K 4.7 11/30/2022   CL 98 11/30/2022   CREATININE 1.25 11/30/2022   BUN 20 11/30/2022   CO2 31 11/30/2022   TSH 1.25 11/30/2022  PSA 1.02 11/30/2022   INR 1.05 07/04/2012   HGBA1C 5.9 11/30/2022    Lab Results  Component Value Date   TSH 1.25 11/30/2022   Lab Results  Component Value Date   WBC 3.7 (L) 11/30/2022   HGB 13.9 11/30/2022   HCT 42.2 11/30/2022   MCV 91.0 11/30/2022   PLT 159.0 11/30/2022   Lab Results  Component Value Date   NA 138 11/30/2022   K 4.7 11/30/2022   CO2 31 11/30/2022   GLUCOSE  107 (H) 11/30/2022   BUN 20 11/30/2022   CREATININE 1.25 11/30/2022   BILITOT 0.9 11/30/2022   ALKPHOS 62 11/30/2022   AST 18 11/30/2022   ALT 15 11/30/2022   PROT 7.1 11/30/2022   ALBUMIN 4.7 11/30/2022   CALCIUM 9.7 11/30/2022   GFR 57.23 (L) 11/30/2022   Lab Results  Component Value Date   CHOL 130 11/30/2022   Lab Results  Component Value Date   HDL 51.20 11/30/2022   Lab Results  Component Value Date   LDLCALC 64 11/30/2022   Lab Results  Component Value Date   TRIG 74.0 11/30/2022   Lab Results  Component Value Date   CHOLHDL 3 11/30/2022   Lab Results  Component Value Date   HGBA1C 5.9 11/30/2022       Assessment & Plan:  Hyperglycemia Assessment & Plan: hgba1c acceptable, minimize simple carbs. Increase exercise as tolerated.   Orders: -     Hemoglobin A1c  Primary hypertension Assessment & Plan: Well controlled, no changes to meds. Encouraged heart healthy diet such as the DASH diet and exercise as tolerated.   Orders: -     CBC with Differential/Platelet -     Comprehensive metabolic panel -     TSH  Myalgia Assessment & Plan: Hydrate and monitor   Orders: -     Magnesium  Hyperlipidemia, unspecified hyperlipidemia type Assessment & Plan: Encourage heart healthy diet such as MIND or DASH diet, increase exercise, avoid trans fats, simple carbohydrates and processed foods, consider a krill or fish or flaxseed oil cap daily.   Orders: -     Lipid panel  Preventative health care Assessment & Plan: Patient encouraged to maintain heart healthy diet, regular exercise, adequate sleep. Consider daily probiotics. Take medications as prescribed. Labs ordered and reviewed. He will consider a colonoscopy in the near future. No bowel changes. Labs ordered and reviewed. Consider RSV and Shingrix vaccines and annual fall covid and flu boosters   Nocturia -     PSA  Methadone use Assessment & Plan: Stable but does struggles with back pain still.  Encouraged moist heat and gentle stretching as tolerated. May try NSAIDs and prescription meds as directed and report if symptoms worsen or seek immediate care    Bilateral low back pain, unspecified chronicity, unspecified whether sciatica present -     DG Lumbar Spine 2-3 Views; Future  Recurrent oral ulcers -     magic mouthwash; Take 5 mLs by mouth 4 (four) times daily as needed for mouth pain. Swish and spit  Dispense: 240 mL; Refill: 1    Assessment and Plan    Chronic Low Back Pain: Intermittent exacerbations with numbness in both legs. No recent imaging. -Order lumbar spine x-ray today to assess for any significant changes since last imaging 7 years ago. -Consider MRI and consultation with orthopedist or neurosurgeon if symptoms worsen or if significant changes are noted on x-ray.  Oral Ulcers: Flare-ups with certain foods. -Refill prescription  for magic mouthwash as needed for flare-ups.  Colon Cancer Screening: No recent colonoscopy or stool test. No personal history of colon polyps or family history of colon cancer. -Offered Cologuard test for colon cancer screening but he declines for now  General Health Maintenance: -Order routine labs including glucose, kidney function, thyroid function, cholesterol, and PSA. -Consider vaccinations for RSV and shingles. -Encourage hydration, protein intake, and maintaining physical activity (at least 4000 steps per day). -Plan for follow-up in 6 months, or sooner if back pain worsens.         Danise Edge, MD

## 2022-12-03 DIAGNOSIS — F1120 Opioid dependence, uncomplicated: Secondary | ICD-10-CM | POA: Diagnosis not present

## 2022-12-05 ENCOUNTER — Other Ambulatory Visit: Payer: Self-pay | Admitting: Family Medicine

## 2022-12-05 DIAGNOSIS — M545 Low back pain, unspecified: Secondary | ICD-10-CM

## 2022-12-13 DIAGNOSIS — F1120 Opioid dependence, uncomplicated: Secondary | ICD-10-CM | POA: Diagnosis not present

## 2022-12-17 DIAGNOSIS — F1120 Opioid dependence, uncomplicated: Secondary | ICD-10-CM | POA: Diagnosis not present

## 2022-12-31 DIAGNOSIS — F1120 Opioid dependence, uncomplicated: Secondary | ICD-10-CM | POA: Diagnosis not present

## 2023-01-14 DIAGNOSIS — F1120 Opioid dependence, uncomplicated: Secondary | ICD-10-CM | POA: Diagnosis not present

## 2023-01-17 DIAGNOSIS — F1120 Opioid dependence, uncomplicated: Secondary | ICD-10-CM | POA: Diagnosis not present

## 2023-01-23 DIAGNOSIS — K080 Exfoliation of teeth due to systemic causes: Secondary | ICD-10-CM | POA: Diagnosis not present

## 2023-01-28 DIAGNOSIS — F1120 Opioid dependence, uncomplicated: Secondary | ICD-10-CM | POA: Diagnosis not present

## 2023-02-01 ENCOUNTER — Other Ambulatory Visit: Payer: Self-pay | Admitting: Family Medicine

## 2023-02-05 DIAGNOSIS — K080 Exfoliation of teeth due to systemic causes: Secondary | ICD-10-CM | POA: Diagnosis not present

## 2023-02-11 DIAGNOSIS — F1120 Opioid dependence, uncomplicated: Secondary | ICD-10-CM | POA: Diagnosis not present

## 2023-02-15 DIAGNOSIS — F1120 Opioid dependence, uncomplicated: Secondary | ICD-10-CM | POA: Diagnosis not present

## 2023-02-25 DIAGNOSIS — F1120 Opioid dependence, uncomplicated: Secondary | ICD-10-CM | POA: Diagnosis not present

## 2023-03-01 DIAGNOSIS — K080 Exfoliation of teeth due to systemic causes: Secondary | ICD-10-CM | POA: Diagnosis not present

## 2023-03-11 DIAGNOSIS — F1120 Opioid dependence, uncomplicated: Secondary | ICD-10-CM | POA: Diagnosis not present

## 2023-03-15 DIAGNOSIS — F1120 Opioid dependence, uncomplicated: Secondary | ICD-10-CM | POA: Diagnosis not present

## 2023-03-16 ENCOUNTER — Other Ambulatory Visit: Payer: Self-pay | Admitting: Family Medicine

## 2023-03-16 DIAGNOSIS — Z1211 Encounter for screening for malignant neoplasm of colon: Secondary | ICD-10-CM

## 2023-03-16 DIAGNOSIS — Z1212 Encounter for screening for malignant neoplasm of rectum: Secondary | ICD-10-CM

## 2023-03-22 DIAGNOSIS — F1120 Opioid dependence, uncomplicated: Secondary | ICD-10-CM | POA: Diagnosis not present

## 2023-03-25 DIAGNOSIS — F1120 Opioid dependence, uncomplicated: Secondary | ICD-10-CM | POA: Diagnosis not present

## 2023-04-08 DIAGNOSIS — F1120 Opioid dependence, uncomplicated: Secondary | ICD-10-CM | POA: Diagnosis not present

## 2023-04-22 DIAGNOSIS — F1120 Opioid dependence, uncomplicated: Secondary | ICD-10-CM | POA: Diagnosis not present

## 2023-04-26 DIAGNOSIS — F1120 Opioid dependence, uncomplicated: Secondary | ICD-10-CM | POA: Diagnosis not present

## 2023-05-06 DIAGNOSIS — F1120 Opioid dependence, uncomplicated: Secondary | ICD-10-CM | POA: Diagnosis not present

## 2023-05-15 ENCOUNTER — Other Ambulatory Visit: Payer: Self-pay | Admitting: Family Medicine

## 2023-05-20 DIAGNOSIS — F1120 Opioid dependence, uncomplicated: Secondary | ICD-10-CM | POA: Diagnosis not present

## 2023-05-24 DIAGNOSIS — F1120 Opioid dependence, uncomplicated: Secondary | ICD-10-CM | POA: Diagnosis not present

## 2023-06-03 DIAGNOSIS — F1120 Opioid dependence, uncomplicated: Secondary | ICD-10-CM | POA: Diagnosis not present

## 2023-06-04 ENCOUNTER — Ambulatory Visit: Payer: Medicare Other | Admitting: Family Medicine

## 2023-06-15 DIAGNOSIS — F1120 Opioid dependence, uncomplicated: Secondary | ICD-10-CM | POA: Diagnosis not present

## 2023-06-17 DIAGNOSIS — F1120 Opioid dependence, uncomplicated: Secondary | ICD-10-CM | POA: Diagnosis not present

## 2023-06-18 ENCOUNTER — Ambulatory Visit: Payer: Medicare Other | Admitting: Family Medicine

## 2023-06-21 DIAGNOSIS — F1120 Opioid dependence, uncomplicated: Secondary | ICD-10-CM | POA: Diagnosis not present

## 2023-07-01 DIAGNOSIS — F1120 Opioid dependence, uncomplicated: Secondary | ICD-10-CM | POA: Diagnosis not present

## 2023-07-02 ENCOUNTER — Telehealth (INDEPENDENT_AMBULATORY_CARE_PROVIDER_SITE_OTHER): Payer: Medicare Other | Admitting: Family Medicine

## 2023-07-02 ENCOUNTER — Encounter: Payer: Self-pay | Admitting: Family Medicine

## 2023-07-02 VITALS — Ht 72.0 in | Wt 164.0 lb

## 2023-07-02 DIAGNOSIS — M545 Low back pain, unspecified: Secondary | ICD-10-CM

## 2023-07-02 DIAGNOSIS — K1379 Other lesions of oral mucosa: Secondary | ICD-10-CM

## 2023-07-02 DIAGNOSIS — I10 Essential (primary) hypertension: Secondary | ICD-10-CM

## 2023-07-02 MED ORDER — MAGIC MOUTHWASH
5.0000 mL | Freq: Four times a day (QID) | ORAL | 1 refills | Status: DC | PRN
Start: 2023-07-02 — End: 2024-03-04

## 2023-07-02 NOTE — Assessment & Plan Note (Signed)
Recurrent oral ulcers triggered by certain foods. Managed with Magic Mouthwash as needed. -Refill Magic Mouthwash

## 2023-07-02 NOTE — Progress Notes (Signed)
Does not have a smart phone and requesting call for visit at [phone].   No issues - needs refill magic mouthwash.

## 2023-07-02 NOTE — Progress Notes (Signed)
Virtual Video Visit via MyChart Note  I connected with  Shaun [name] on 07/02/23 at  9:20 AM EST by the video enabled telemedicine application for MyChart, and verified that I am speaking with the correct person using two identifiers.   I introduced myself as a Publishing rights manager with the practice. We discussed the limitations of evaluation and management by telemedicine and the availability of in person appointments. The patient expressed understanding and agreed to proceed.  Participating parties in this visit include: The patient and the nurse practitioner listed.   The patient is: At home I am: at mom  Provider with audio/video capabilities, but patient unable to get video. We will do an audio-only visit.   Subjective:    CC:  Chief Complaint  Patient presents with   Medical Management of Chronic Issues    HPI: Shaun [name] is a 73 y.o. year old male presenting today via MyChart today for routine follow-up.    Discussed the use of AI scribe software for clinical note transcription with the patient, who gave verbal consent to proceed.  History of Present Illness   The patient, with a history of hypertension and chronic back pain, has been managing well on their current regimen of Lisinopril 10mg  daily and Methadone. They deny any side effects from Lisinopril and report occasional blood pressure checks, with the most recent reading approximately a month ago at 125/78. They deny any symptoms of chest pain, headaches, vision changes, or dizziness.  The patient also takes Zanaflex, a muscle relaxer, intermittently for chronic back pain. The frequency of use varies and is dependent on their physical activity, particularly if they lift heavy objects. A few months ago, they experienced a significant increase in back pain lasting for two to three months.  In addition to hypertension and chronic back pain, the patient has a long-standing history of oral ulcers, which they manage  with Magic Mouthwash. The ulcers are triggered by certain foods, such as nuts and tomatoes, and the frequency and severity of the ulcers have decreased over the years. Currently, they have one ulcer in their cheek, which they attribute to dietary indiscretions over the holidays. The Magic Mouthwash provides relief from the pain associated with these ulcers.            Past medical history, Surgical history, Family history not pertinant except as noted below, Social history, Allergies, and medications have been entered into the medical record, reviewed, and corrections made.   Review of Systems:  All review of systems negative except what is listed in the HPI   Objective:    General:  Speaking clearly in complete sentences. Absent shortness of breath noted.   Alert and oriented x3.   Normal judgment.  Absent acute distress.   Impression and Recommendations:    Problem List Items Addressed This Visit       Active Problems   Low back pain - Primary   Intermittent back pain managed with Zanaflex as needed. No recent exacerbations. -Continue Zanaflex as needed for back pain. Supportive measures.      Hypertension   Well controlled on Lisinopril 10mg  daily. No reported side effects. Last self-reported BP 125/78. -Continue Lisinopril 10mg  daily and heart healthy lifestyle.      Recurrent oral ulcers   Recurrent oral ulcers triggered by certain foods. Managed with Magic Mouthwash as needed. -Refill Magic Mouthwash       Relevant Medications   magic mouthwash SOLN    Declined labs today.  Routine PCP follow-up in 4-6 months.      I discussed the assessment and treatment plan with the patient. The patient was provided an opportunity to ask questions and all were answered. The patient agreed with the plan and demonstrated an understanding of the instructions.   The patient was advised to call back or seek an in-person evaluation if the symptoms worsen or if the  condition fails to improve as anticipated.   Shaun Dana, NP    I spent 30 minutes dedicated to the care of this patient on the date of this encounter to include pre-visit chart review of prior notes and results, face-to-face time with the patient performing a medically appropriate exam, counseling/education regarding chronic conditions and lifestyle measures, and post-visit documentation and ordering of medication as indicated.

## 2023-07-02 NOTE — Assessment & Plan Note (Signed)
Intermittent back pain managed with Zanaflex as needed. No recent exacerbations. -Continue Zanaflex as needed for back pain. Supportive measures.

## 2023-07-02 NOTE — Assessment & Plan Note (Signed)
Well controlled on Lisinopril 10mg  daily. No reported side effects. Last self-reported BP 125/78. -Continue Lisinopril 10mg  daily and heart healthy lifestyle.

## 2023-07-15 DIAGNOSIS — F1120 Opioid dependence, uncomplicated: Secondary | ICD-10-CM | POA: Diagnosis not present

## 2023-07-19 DIAGNOSIS — F1120 Opioid dependence, uncomplicated: Secondary | ICD-10-CM | POA: Diagnosis not present

## 2023-07-29 DIAGNOSIS — F1120 Opioid dependence, uncomplicated: Secondary | ICD-10-CM | POA: Diagnosis not present

## 2023-08-12 DIAGNOSIS — F1120 Opioid dependence, uncomplicated: Secondary | ICD-10-CM | POA: Diagnosis not present

## 2023-08-16 DIAGNOSIS — F1120 Opioid dependence, uncomplicated: Secondary | ICD-10-CM | POA: Diagnosis not present

## 2023-08-26 DIAGNOSIS — F1120 Opioid dependence, uncomplicated: Secondary | ICD-10-CM | POA: Diagnosis not present

## 2023-09-04 ENCOUNTER — Telehealth: Payer: Self-pay | Admitting: Family Medicine

## 2023-09-04 NOTE — Telephone Encounter (Signed)
 Copied from CRM [id number]. Topic: Medicare AWV >> Sep 04, 2023 10:06 AM Payton Doughty wrote: Reason for CRM: Called 09/04/2023 to sched AWV - NO VOICEMAIL  Verlee Rossetti; Care Guide Ambulatory Clinical Support [place] [facility] Direct Dial: [phone]

## 2023-09-06 ENCOUNTER — Other Ambulatory Visit: Payer: Self-pay | Admitting: Family Medicine

## 2023-09-13 DIAGNOSIS — F1120 Opioid dependence, uncomplicated: Secondary | ICD-10-CM | POA: Diagnosis not present

## 2023-10-21 DIAGNOSIS — F1120 Opioid dependence, uncomplicated: Secondary | ICD-10-CM | POA: Diagnosis not present

## 2023-10-25 DIAGNOSIS — F1120 Opioid dependence, uncomplicated: Secondary | ICD-10-CM | POA: Diagnosis not present

## 2023-11-04 DIAGNOSIS — F1120 Opioid dependence, uncomplicated: Secondary | ICD-10-CM | POA: Diagnosis not present

## 2023-11-13 ENCOUNTER — Other Ambulatory Visit: Payer: Self-pay | Admitting: Family Medicine

## 2023-11-18 DIAGNOSIS — F1120 Opioid dependence, uncomplicated: Secondary | ICD-10-CM | POA: Diagnosis not present

## 2023-11-22 DIAGNOSIS — F1120 Opioid dependence, uncomplicated: Secondary | ICD-10-CM | POA: Diagnosis not present

## 2023-11-30 NOTE — Assessment & Plan Note (Signed)
 Well controlled, no changes to meds.  Continue lisinopril  10 mg daily. Encouraged heart healthy diet such as the DASH diet and exercise as tolerated.

## 2023-11-30 NOTE — Progress Notes (Unsigned)
 Subjective:     Patient ID: Shaun [name], male    DOB: Jun 06, 1951, 73 y.o.   MRN: [id number]  No chief complaint on file.   HPI   Shaun [name] a 73 y.o.  male/male***presents today for a complete physical exam. Pt reports consuming a {diet types:17450} diet. {types:19826} Pt generally feels {DESC; WELL/FAIRLY WELL/POORLY:18703}. Reports sleeping {DESC; WELL/FAIRLY WELL/POORLY:18703}.  {does/does not:200015} have additional problems to discuss today.   Patient has chronic history of low back pain. Reports intermittent exacerbations of his back pain, which occasionally radiates to his legs causing numbness. The pain and numbness do not persist.   New Surgeries or Family Hx: ***Yes/ Denies Habits: ETOH, illicit drugs, smoking, secondhand exposure, caffeine   HCM:   Colonoscopy- Due Immunizations: Due for Shingrix 1/2     History of Present Illness              Health Maintenance Due  Topic Date Due   Fecal DNA (Cologuard)  Never done   Zoster Vaccines- Shingrix (1 of 2) 09/02/1999   Colonoscopy  06/19/2023   Medicare Annual Wellness (AWV)  10/04/2023    Past Medical History:  Diagnosis Date   Allergic state 01/30/2017   Arm mass    Arm skin lesion, left 11/12/2012   And sun damaged skin   Elevated BP 11/14/2012   Encounter for Medicare annual wellness exam 01/27/2016   GERD (gastroesophageal reflux disease) 01/27/2016   Hepatitis C, chronic (HCC)    History of intravenous drug use in remission    History of oral lesions 05/14/2013   History of oral lesions 07/29/2015   Hypertension 11/14/2012   Left shoulder pain 12/16/2012   Methadone use 01/31/2015   Myalgia and myositis 08/13/2014   Preventative health care 05/14/2013   Preventative health care 05/14/2013    Past Surgical History:  Procedure Laterality Date   KNEE ARTHROSCOPY  1996   ligment repair    Family History  Problem Relation Age of Onset   Heart disease Father    Diabetes Father     Hyperlipidemia Father    Hypertension Father    Thyroid  disease Sister    Heart disease Cousin        MI   Diabetes Cousin    Obesity Cousin    Osteoporosis Mother    Diabetes Paternal Grandfather    Thyroid  disease Maternal Grandmother    Cancer Neg Hx        negative for colon and prostate    Social History   Socioeconomic History   Marital status: Married    Spouse name: Not on file   Number of children: Not on file   Years of education: Not on file   Highest education level: Not on file  Occupational History   Not on file  Tobacco Use   Smoking status: Never   Smokeless tobacco: Never  Vaping Use   Vaping status: Never Used  Substance and Sexual Activity   Alcohol use: No    Alcohol/week: 0.0 standard drinks of alcohol   Drug use: No   Sexual activity: Yes    Comment: avoids nuts, fruits, lives with wife, makes picture frames, building maintenance  Other Topics Concern   Not on file  Social History Narrative   Not on file   Social Drivers of Health   Financial Resource Strain: Low Risk  (10/03/2021)   Overall Financial Resource Strain (CARDIA)    Difficulty of Paying Living Expenses: Not hard at all  Food Insecurity: No Food Insecurity (10/04/2022)   Hunger Vital Sign    Worried About Running Out of Food in the Last Year: Never true    Ran Out of Food in the Last Year: Never true  Transportation Needs: No Transportation Needs (10/04/2022)   PRAPARE - Administrator, Civil Service (Medical): No    Lack of Transportation (Non-Medical): No  Physical Activity: Sufficiently Active (10/03/2021)   Exercise Vital Sign    Days of Exercise per Week: 7 days    Minutes of Exercise per Session: 30 min  Stress: No Stress Concern Present (10/03/2021)   [employer] of Occupational Health - Occupational Stress Questionnaire    Feeling of Stress : Only a little  Social Connections: Moderately Isolated (10/03/2021)   Social Connection and Isolation Panel     Frequency of Communication with Friends and Family: More than three times a week    Frequency of Social Gatherings with Friends and Family: More than three times a week    Attends Religious Services: Never    Database administrator or Organizations: No    Attends Banker Meetings: Never    Marital Status: Married  Catering manager Violence: Not At Risk (10/04/2022)   Humiliation, Afraid, Rape, and Kick questionnaire    Fear of Current or Ex-Partner: No    Emotionally Abused: No    Physically Abused: No    Sexually Abused: No    Outpatient Medications Prior to Visit  Medication Sig Dispense Refill   fluocinonide  gel (LIDEX ) 0.05 % APPLY TO TONGUE TWICE DAILY AS NEEDED 60 g 0   lisinopril  (ZESTRIL ) 10 MG tablet Take 1 tablet (10 mg total) by mouth daily. 90 tablet 0   magic mouthwash SOLN Take 5 mLs by mouth 4 (four) times daily as needed for mouth pain. Swish and spit 240 mL 1   methadone (DOLOPHINE) 10 MG/5ML solution Take 8 mg by mouth every morning.     Multiple Vitamin (MULTIVITAMIN) tablet Take 1 tablet by mouth every morning.      tiZANidine  (ZANAFLEX ) 4 MG tablet TAKE 1/2 TO 1 TABLET BY MOUTH TWICE DAILY AS NEEDED FOR MUSCLE SPASMS 30 tablet 3   No facility-administered medications prior to visit.    Allergies  Allergen Reactions   Tomato Other (See Comments)    Mouth     ROS     Objective:    Physical Exam   There were no vitals taken for this visit. Wt Readings from Last 3 Encounters:  07/02/23 164 lb (74.4 kg)  11/30/22 164 lb 9.6 oz (74.7 kg)  10/04/22 165 lb 9.6 oz (75.1 kg)       Assessment & Plan:   Problem List Items Addressed This Visit     Hyperglycemia - Primary   hgba1c acceptable, minimize simple carbs. Increase exercise as tolerated.         Hyperlipidemia   Encourage heart healthy diet such as MIND or DASH diet, increase exercise, avoid trans fats, simple carbohydrates and processed foods, consider a krill or fish or flaxseed  oil cap daily.        Hypertension   Well controlled, no changes to meds.  Continue lisinopril  10 mg daily. Encouraged heart healthy diet such as the DASH diet and exercise as tolerated.        Low back pain   Stable but does struggles with back pain still. Intermittent back pain managed with Zanaflex  as needed. No recent exacerbations. -Continue  Zanaflex  as needed for back pain. Supportive measures.Encouraged moist heat and gentle stretching as tolerated. May try NSAIDs and prescription meds as directed and report if symptoms worsen or seek immediate care.          Myalgia   Hydrate and monitor.      Preventative health care   Patient encouraged to maintain heart healthy diet, regular exercise, adequate sleep. Consider daily probiotics. Take medications as prescribed. Labs ordered and reviewed. He will consider a colonoscopy in the near future. No bowel changes. Labs ordered and reviewed. Consider RSV and Shingrix vaccines and annual fall covid and flu boosters  Colon Cancer Screening: No recent colonoscopy or stool test. No personal history of colon polyps or family history of colon cancer. -Offered Cologuard test for colon cancer screening but he declines for now      Methadone use Stable.   Chronic Low Back Pain: Intermittent exacerbations with numbness in both legs. No recent imaging. -Order lumbar spine x-ray today to assess for any significant changes since last imaging 7 years ago. -Consider MRI and consultation with orthopedist or neurosurgeon if symptoms worsen or if significant changes are noted on x-ray.  Oral Ulcers: Flare-ups with certain foods. -Refill prescription for magic mouthwash as needed for flare-ups   General Health Maintenance: -Order routine labs including glucose, kidney function, thyroid  function, cholesterol, and PSA. -Consider vaccinations for RSV and shingles. -Encourage hydration, protein intake, and maintaining physical activity (at least 4000  steps per day). -Plan for follow-up in 6 months, or sooner if back pain worsens.  I am having Shaun [name] maintain his methadone, multivitamin, fluocinonide  gel, magic mouthwash, tiZANidine , and lisinopril .  No orders of the defined types were placed in this encounter.

## 2023-11-30 NOTE — Assessment & Plan Note (Signed)
 Hydrate and monitor

## 2023-11-30 NOTE — Assessment & Plan Note (Signed)
 Stable but does struggles with back pain still. Intermittent back pain managed with Zanaflex  as needed. No recent exacerbations. -Continue Zanaflex  as needed for back pain. Supportive measures.Encouraged moist heat and gentle stretching as tolerated. May try NSAIDs and prescription meds as directed and report if symptoms worsen or seek immediate care.

## 2023-11-30 NOTE — Assessment & Plan Note (Addendum)
 Patient encouraged to maintain heart healthy diet, regular exercise, adequate sleep. Consider daily probiotics. Take medications as prescribed. Labs ordered and reviewed. He will consider a colonoscopy in the near future. No bowel changes. Labs ordered and reviewed. Consider RSV and Shingrix vaccines and annual fall covid and flu boosters  Colon Cancer Screening: No recent colonoscopy or stool test. No personal history of colon polyps or family history of colon cancer. -Offered Cologuard test for colon cancer screening but he declines for now

## 2023-11-30 NOTE — Assessment & Plan Note (Signed)
 Upate lipid panel today.  Encourage heart healthy diet such as MIND or DASH diet, increase exercise, avoid trans fats, simple carbohydrates and processed foods, consider a krill or fish or flaxseed oil cap daily.

## 2023-11-30 NOTE — Assessment & Plan Note (Signed)
 hgba1c acceptable, minimize simple carbs. Increase exercise as tolerated.  Update labs today

## 2023-12-02 DIAGNOSIS — F1120 Opioid dependence, uncomplicated: Secondary | ICD-10-CM | POA: Diagnosis not present

## 2023-12-03 ENCOUNTER — Encounter: Admitting: Family Medicine

## 2023-12-04 ENCOUNTER — Encounter: Admitting: Family Medicine

## 2023-12-04 ENCOUNTER — Encounter: Payer: Self-pay | Admitting: Student

## 2023-12-04 ENCOUNTER — Ambulatory Visit (INDEPENDENT_AMBULATORY_CARE_PROVIDER_SITE_OTHER): Admitting: Student

## 2023-12-04 VITALS — BP 136/66 | HR 69 | Temp 98.2°F | Resp 12 | Ht 72.0 in | Wt 160.6 lb

## 2023-12-04 DIAGNOSIS — E785 Hyperlipidemia, unspecified: Secondary | ICD-10-CM | POA: Diagnosis not present

## 2023-12-04 DIAGNOSIS — Z7189 Other specified counseling: Secondary | ICD-10-CM | POA: Insufficient documentation

## 2023-12-04 DIAGNOSIS — M545 Low back pain, unspecified: Secondary | ICD-10-CM

## 2023-12-04 DIAGNOSIS — R739 Hyperglycemia, unspecified: Secondary | ICD-10-CM | POA: Diagnosis not present

## 2023-12-04 DIAGNOSIS — M791 Myalgia, unspecified site: Secondary | ICD-10-CM

## 2023-12-04 DIAGNOSIS — Z0000 Encounter for general adult medical examination without abnormal findings: Secondary | ICD-10-CM | POA: Diagnosis not present

## 2023-12-04 DIAGNOSIS — I10 Essential (primary) hypertension: Secondary | ICD-10-CM | POA: Diagnosis not present

## 2023-12-04 DIAGNOSIS — J3489 Other specified disorders of nose and nasal sinuses: Secondary | ICD-10-CM | POA: Insufficient documentation

## 2023-12-04 NOTE — Assessment & Plan Note (Signed)
 Referral sent for audiologist.

## 2023-12-04 NOTE — Patient Instructions (Signed)

## 2023-12-04 NOTE — Assessment & Plan Note (Signed)
Referral sent for dermatology

## 2023-12-05 ENCOUNTER — Ambulatory Visit: Payer: Self-pay | Admitting: Student

## 2023-12-05 LAB — MAGNESIUM: Magnesium: 2.1 mg/dL (ref 1.5–2.5)

## 2023-12-05 LAB — PSA: PSA: 0.93 ng/mL (ref 0.10–4.00)

## 2023-12-05 LAB — COMPREHENSIVE METABOLIC PANEL WITH GFR
ALT: 18 U/L (ref 0–53)
AST: 23 U/L (ref 0–37)
Albumin: 5.1 g/dL (ref 3.5–5.2)
Alkaline Phosphatase: 69 U/L (ref 39–117)
BUN: 21 mg/dL (ref 6–23)
CO2: 27 meq/L (ref 19–32)
Calcium: 9.6 mg/dL (ref 8.4–10.5)
Chloride: 98 meq/L (ref 96–112)
Creatinine, Ser: 1.46 mg/dL (ref 0.40–1.50)
GFR: 47.16 mL/min — ABNORMAL LOW (ref >60.00)
Glucose, Bld: 103 mg/dL — ABNORMAL HIGH (ref 70–99)
Potassium: 4.6 meq/L (ref 3.5–5.1)
Sodium: 137 meq/L (ref 135–145)
Total Bilirubin: 0.6 mg/dL (ref 0.2–1.2)
Total Protein: 7.2 g/dL (ref 6.0–8.3)

## 2023-12-05 LAB — HEMOGLOBIN A1C: Hgb A1c MFr Bld: 5.8 % (ref 4.6–6.5)

## 2023-12-05 LAB — LIPID PANEL
Cholesterol: 132 mg/dL (ref 0–200)
HDL: 56.4 mg/dL (ref >39.00)
LDL Cholesterol: 54 mg/dL (ref 0–99)
NonHDL: 75.15
Total CHOL/HDL Ratio: 2
Triglycerides: 107 mg/dL (ref 0.0–149.0)
VLDL: 21.4 mg/dL (ref 0.0–40.0)

## 2023-12-05 LAB — CBC
HCT: 41.4 % (ref 39.0–52.0)
Hemoglobin: 14 g/dL (ref 13.0–17.0)
MCHC: 33.7 g/dL (ref 30.0–36.0)
MCV: 90.5 fl (ref 78.0–100.0)
Platelets: 165 10*3/uL (ref 150.0–400.0)
RBC: 4.58 Mil/uL (ref 4.22–5.81)
RDW: 13 % (ref 11.5–15.5)
WBC: 4.2 10*3/uL (ref 4.0–10.5)

## 2023-12-05 LAB — TSH: TSH: 1.09 u[IU]/mL (ref 0.35–5.50)

## 2023-12-05 NOTE — Progress Notes (Signed)
 [facility] Quality Team Note  Name: Torryn Hudspeth Date of Birth: 1950/06/22 MRN: [id number] Date: 12/05/2023  [facility] Quality Team has reviewed this patient's chart, please see recommendations below:  [facility] Quality Other; (CHART REVIEWED FOR CONTROLLING BLOOD PRESSURE AND COLORECTAL CANCER SCREENING. ABSTRACTED MOST RECENT CBP. COLOGUARD ORDERED BUT NOT YET COMPLETED.)

## 2023-12-16 DIAGNOSIS — F1120 Opioid dependence, uncomplicated: Secondary | ICD-10-CM | POA: Diagnosis not present

## 2023-12-20 DIAGNOSIS — F1120 Opioid dependence, uncomplicated: Secondary | ICD-10-CM | POA: Diagnosis not present

## 2023-12-27 ENCOUNTER — Encounter: Payer: Self-pay | Admitting: Physical Medicine and Rehabilitation

## 2023-12-27 ENCOUNTER — Ambulatory Visit: Admitting: Physical Medicine and Rehabilitation

## 2023-12-27 DIAGNOSIS — G8929 Other chronic pain: Secondary | ICD-10-CM | POA: Diagnosis not present

## 2023-12-27 DIAGNOSIS — M5441 Lumbago with sciatica, right side: Secondary | ICD-10-CM

## 2023-12-27 DIAGNOSIS — M5442 Lumbago with sciatica, left side: Secondary | ICD-10-CM

## 2023-12-27 DIAGNOSIS — M5416 Radiculopathy, lumbar region: Secondary | ICD-10-CM | POA: Diagnosis not present

## 2023-12-27 DIAGNOSIS — R202 Paresthesia of skin: Secondary | ICD-10-CM | POA: Diagnosis not present

## 2023-12-27 DIAGNOSIS — M47816 Spondylosis without myelopathy or radiculopathy, lumbar region: Secondary | ICD-10-CM

## 2023-12-27 NOTE — Progress Notes (Signed)
 Shaun [name] - 73 y.o. male MRN [id number]  Date of birth: 13-Jul-1950  Office Visit Note: Visit Date: 12/27/2023 PCP: Domenica Harlene LABOR, MD Referred by: Wheeler Harlene CROME, NP  Subjective: Chief Complaint  Patient presents with   Lower Back - Pain   HPI: Shaun [name] is a 73 y.o. male who comes in today per the request of chronic, worsening and severe bilateral lower back pain radiating down both lateral legs. Also reports numbness and tingling to bilateral lower extremities. He reports chronic lower back issues for 40 plus years, worsened over the last 5 years. His pain becomes severe with prolonged standing and walking. He describes pain as sore and aching sensation, currently rates as 8 out of 10. Sitting seems to alleviate his pain. Some relief of pain with home exercise regimen, rest and use of medications. No history of formal physical therapy. Lumbar radiographs from 2024 shows slight dextrocurvature, severe thoracolumbar degenerative disc disease. No history of lumbar surgery/injections. He is currently working part time making picture frames. Patient denies focal weakness. No recent trauma or falls.      Review of Systems  Musculoskeletal:  Positive for back pain.  Neurological:  Positive for tingling. Negative for focal weakness and weakness.  All other systems reviewed and are negative.  Otherwise per HPI.  Assessment & Plan: Visit Diagnoses:    ICD-10-CM   1. Chronic bilateral low back pain with bilateral sciatica  M54.42 MR LUMBAR SPINE WO CONTRAST   M54.41    G89.29     2. Radiculopathy, lumbar region  M54.16 MR LUMBAR SPINE WO CONTRAST    3. Facet arthropathy, lumbar  M47.816 MR LUMBAR SPINE WO CONTRAST    4. Paresthesia of skin  R20.2 MR LUMBAR SPINE WO CONTRAST       Plan: Findings:  Chronic, worsening and severe bilateral lower back pain radiating down both lateral legs. Paresthesias noted to bilateral lower extremities. Patient continues to have  severe pain despite good conservative therapies such as home exercise regimen, rest and use of medications. Patients clinical presentation and exam are consistent with lumbar radiculopathy, more of L5 nerve pattern. I am concerned about possible spinal canal stenosis. We discussed treatment plan in detail today, next step is to obtain lumbar MRI imaging. Depending on results of lumbar MRI imaging we discussed possibility of performing lumbar epidural steroid injection. Would also consider short course of formal physical therapy. Patient has no questions at this time. No red flag symptoms noted upon exam today.     Meds & Orders: No orders of the defined types were placed in this encounter.   Orders Placed This Encounter  Procedures   MR LUMBAR SPINE WO CONTRAST    Follow-up: Return for Lumbar MRI review.   Procedures: No procedures performed      Clinical History: No specialty comments available.   He reports that he has never smoked. He has never used smokeless tobacco.  Recent Labs    12/04/23 1539  HGBA1C 5.8    Objective:  VS:  HT:    WT:   BMI:     BP:   HR: bpm  TEMP: ( )  RESP:  Physical Exam Vitals and nursing note reviewed.  HENT:     Head: Normocephalic and atraumatic.     Right Ear: External ear normal.     Left Ear: External ear normal.     Nose: Nose normal.     Mouth/Throat:     Mouth: Mucous membranes  are moist.  Eyes:     Extraocular Movements: Extraocular movements intact.  Cardiovascular:     Rate and Rhythm: Normal rate.     Pulses: Normal pulses.  Pulmonary:     Effort: Pulmonary effort is normal.  Abdominal:     General: Abdomen is flat. There is no distension.  Musculoskeletal:        General: Tenderness present.     Cervical back: Normal range of motion.     Comments: Patient rises from seated position to standing without difficulty. Good lumbar range of motion. No pain noted with facet loading. 5/5 strength noted with bilateral hip flexion,  knee flexion/extension, ankle dorsiflexion/plantarflexion and EHL. No clonus noted bilaterally. No pain upon palpation of greater trochanters. No pain with internal/external rotation of bilateral hips. Sensation intact bilaterally. Negative slump test bilaterally. Ambulates without aid, gait steady.     Skin:    General: Skin is warm and dry.     Capillary Refill: Capillary refill takes less than 2 seconds.  Neurological:     General: No focal deficit present.     Mental Status: He is alert and oriented to person, place, and time.  Psychiatric:        Mood and Affect: Mood normal.        Behavior: Behavior normal.     Ortho Exam  Imaging: No results found.  Past Medical/Family/Surgical/Social History: Medications & Allergies reviewed per EMR, new medications updated. Patient Active Problem List   Diagnosis Date Noted   Hearing aid consultation 12/04/2023   Lesion of nose 12/04/2023   Lesion of tongue 05/27/2022   Recurrent oral ulcers 09/24/2020   Educated about COVID-19 virus infection 09/27/2019   Hyperlipidemia 02/04/2017   Allergy 01/30/2017   Encounter for Medicare annual wellness exam 01/27/2016   GERD (gastroesophageal reflux disease) 01/27/2016   Thrombocytopenia (HCC) 01/31/2015   Methadone use 01/31/2015   Myalgia 08/13/2014   Preventative health care 05/14/2013   History of oral lesions 05/14/2013   Left shoulder pain 12/16/2012   Hypertension 11/14/2012   Arm skin lesion, left 11/12/2012   Lymphadenopathy 05/22/2012   Hyperglycemia 05/14/2012   Low back pain 07/20/2008   Past Medical History:  Diagnosis Date   Allergic state 01/30/2017   Arm mass    Arm skin lesion, left 11/12/2012   And sun damaged skin   Elevated BP 11/14/2012   Encounter for Medicare annual wellness exam 01/27/2016   GERD (gastroesophageal reflux disease) 01/27/2016   Hepatitis C, chronic (HCC)    History of intravenous drug use in remission    History of oral lesions 05/14/2013   History  of oral lesions 07/29/2015   Hypertension 11/14/2012   Left shoulder pain 12/16/2012   Methadone use 01/31/2015   Myalgia and myositis 08/13/2014   Preventative health care 05/14/2013   Preventative health care 05/14/2013   Family History  Problem Relation Age of Onset   Osteoporosis Mother    Heart disease Father    Diabetes Father    Hyperlipidemia Father    Hypertension Father    Thyroid  disease Sister    Prostate cancer Brother    Thyroid  disease Maternal Grandmother    Diabetes Paternal Grandfather    Heart disease Cousin        MI   Diabetes Cousin    Obesity Cousin    Cancer Neg Hx        negative for colon and prostate   Past Surgical History:  Procedure Laterality Date  KNEE ARTHROSCOPY  1996   ligment repair   Social History   Occupational History   Not on file  Tobacco Use   Smoking status: Never   Smokeless tobacco: Never  Vaping Use   Vaping status: Never Used  Substance and Sexual Activity   Alcohol use: No    Alcohol/week: 0.0 standard drinks of alcohol   Drug use: No   Sexual activity: Yes    Comment: avoids nuts, fruits, lives with wife, makes picture frames, building maintenance

## 2023-12-27 NOTE — Progress Notes (Signed)
 Pain Scale   Average Pain 3 Patient advising he has lower back pain radiating to right leg at times, patient advising his pain increases when he is moving things and carrying objects., patient also advised when he sits an rests he does get some relief        +Driver, -BT, -Dye Allergies.

## 2023-12-28 DIAGNOSIS — F1120 Opioid dependence, uncomplicated: Secondary | ICD-10-CM | POA: Diagnosis not present

## 2023-12-30 DIAGNOSIS — F1120 Opioid dependence, uncomplicated: Secondary | ICD-10-CM | POA: Diagnosis not present

## 2023-12-31 DIAGNOSIS — L570 Actinic keratosis: Secondary | ICD-10-CM | POA: Diagnosis not present

## 2024-01-04 ENCOUNTER — Encounter: Payer: Self-pay | Admitting: Pharmacist

## 2024-01-04 ENCOUNTER — Other Ambulatory Visit: Payer: Self-pay | Admitting: Pharmacist

## 2024-01-04 DIAGNOSIS — L570 Actinic keratosis: Secondary | ICD-10-CM | POA: Insufficient documentation

## 2024-01-04 MED ORDER — LISINOPRIL 10 MG PO TABS: 10.0000 mg | ORAL_TABLET | Freq: Every day | ORAL | 0 refills | Status: DC

## 2024-01-04 NOTE — Progress Notes (Signed)
 Pharmacy Quality Measure Review  This patient is appearing on a report for being at risk of failing the adherence measure for hypertension (ACEi/ARB) medications this calendar year.   Medication: lisinopril  10mg  Last fill date: 08/07/2023 for 90 day supply  Reviewed recent refill history in Dr Annemarie database. Actual last refill date was 11/13/2023 for 90 day supply. Patient has no refills remaining. Next appointment with PCP is not currently scheduled but he had his yealry physical 12/04/2023.   BP Readings from Last 3 Encounters:  12/04/23 136/66  11/30/22 122/70  10/04/22 133/73     Insurance report was not up to date. No action needed at this time.  Sent in updated Rx to his pharmacy for future needs.   Madelin Ray, PharmD Clinical Pharmacist [facility]  Population Health [phone]

## 2024-01-08 ENCOUNTER — Encounter: Payer: Self-pay | Admitting: *Deleted

## 2024-01-08 DIAGNOSIS — L570 Actinic keratosis: Secondary | ICD-10-CM

## 2024-01-11 ENCOUNTER — Encounter: Payer: Self-pay | Admitting: Physical Medicine and Rehabilitation

## 2024-01-13 DIAGNOSIS — F1120 Opioid dependence, uncomplicated: Secondary | ICD-10-CM | POA: Diagnosis not present

## 2024-01-17 DIAGNOSIS — F1120 Opioid dependence, uncomplicated: Secondary | ICD-10-CM | POA: Diagnosis not present

## 2024-01-27 DIAGNOSIS — F1120 Opioid dependence, uncomplicated: Secondary | ICD-10-CM | POA: Diagnosis not present

## 2024-01-28 ENCOUNTER — Ambulatory Visit
Admission: RE | Admit: 2024-01-28 | Discharge: 2024-01-28 | Disposition: A | Discharge status: Home / Self Care | Source: Ambulatory Visit | Attending: Physical Medicine and Rehabilitation | Admitting: Physical Medicine and Rehabilitation

## 2024-01-28 DIAGNOSIS — M47816 Spondylosis without myelopathy or radiculopathy, lumbar region: Secondary | ICD-10-CM

## 2024-01-28 DIAGNOSIS — R202 Paresthesia of skin: Secondary | ICD-10-CM

## 2024-01-28 DIAGNOSIS — M48061 Spinal stenosis, lumbar region without neurogenic claudication: Secondary | ICD-10-CM | POA: Diagnosis not present

## 2024-01-28 DIAGNOSIS — M5117 Intervertebral disc disorders with radiculopathy, lumbosacral region: Secondary | ICD-10-CM | POA: Diagnosis not present

## 2024-01-28 DIAGNOSIS — G8929 Other chronic pain: Secondary | ICD-10-CM

## 2024-01-28 DIAGNOSIS — M5416 Radiculopathy, lumbar region: Secondary | ICD-10-CM

## 2024-02-10 DIAGNOSIS — F1120 Opioid dependence, uncomplicated: Secondary | ICD-10-CM | POA: Diagnosis not present

## 2024-02-14 DIAGNOSIS — F1120 Opioid dependence, uncomplicated: Secondary | ICD-10-CM | POA: Diagnosis not present

## 2024-02-24 DIAGNOSIS — F1120 Opioid dependence, uncomplicated: Secondary | ICD-10-CM | POA: Diagnosis not present

## 2024-02-29 ENCOUNTER — Encounter: Payer: Self-pay | Admitting: Physical Medicine and Rehabilitation

## 2024-02-29 ENCOUNTER — Other Ambulatory Visit: Payer: Self-pay | Admitting: Family

## 2024-02-29 ENCOUNTER — Ambulatory Visit (INDEPENDENT_AMBULATORY_CARE_PROVIDER_SITE_OTHER): Admitting: Physical Medicine and Rehabilitation

## 2024-02-29 DIAGNOSIS — G8929 Other chronic pain: Secondary | ICD-10-CM

## 2024-02-29 DIAGNOSIS — M5416 Radiculopathy, lumbar region: Secondary | ICD-10-CM

## 2024-02-29 DIAGNOSIS — M5116 Intervertebral disc disorders with radiculopathy, lumbar region: Secondary | ICD-10-CM | POA: Diagnosis not present

## 2024-02-29 NOTE — Progress Notes (Signed)
 Shaun [name] - 73 y.o. male MRN [id number]  Date of birth: 08-08-1950  Office Visit Note: Visit Date: 02/29/2024 PCP: Shaun Harlene LABOR, MD Referred by: Shaun Harlene LABOR, MD  Subjective: Chief Complaint  Patient presents with   Lower Back - Pain   HPI: Shaun [name] is a 73 y.o. male who comes in today for evaluation of chronic, worsening and severe bilateral lower back pain radiating down both lateral legs, right greater than left. Also reports numbness and tingling to bilateral lower extremities. Lower back issues for 40 plus years, worsens with standing and walking. Sitting does alleviate his pain. He describes pain as sore and aching sensation, currently rates as 7 out of 10. Recent lumbar MRI imaging shows multiple disc bulges from T10-11 through L5-S1 with varying degrees of foraminal and lateral recess stenosis, most severe at T11-12, T12-L1, L2-3, and L5-S1. No high grade spinal canal stenosis noted. Patient denies focal weakness. No recent trauma or falls.       Review of Systems  Musculoskeletal:  Positive for back pain.  Neurological:  Positive for tingling. Negative for focal weakness and weakness.  All other systems reviewed and are negative.  Otherwise per HPI.  Assessment & Plan: Visit Diagnoses:    ICD-10-CM   1. Chronic bilateral low back pain with bilateral sciatica  M54.42    M54.41    G89.29     2. Radiculopathy, lumbar region  M54.16     3. Intervertebral disc disorders with radiculopathy, lumbar region  M51.16        Plan: Findings:  Chronic, worsening and severe bilateral lower back pain radiating down both lateral legs, right greater than left. Patient continues to have severe pain despite good conservative therapies such as home exercise regimen, rest and use of medications. I discussed recent lumbar MRI with patient today using imaging and spine model. I explained that disc bulging is common, however there is multi level nerve impingement noted.  We discussed treatment plan in detail today. Next step is to perform diagnostic and hopefully therapeutic right L4-L5 interlaminar epidural steroid injection under fluoroscopic guidance. He is not currently taking anticoagulant medication. I discussed injection procedure in detail, he has no questions at this time. We will see him back for injection. No red flag symptoms noted upon exam today.     Meds & Orders: No orders of the defined types were placed in this encounter.  No orders of the defined types were placed in this encounter.   Follow-up: Return for Right L5-S1 interlaminar epidural steroid injection.   Procedures: No procedures performed      Clinical History: Narrative & Impression EXAM: MRI LUMBAR SPINE 01/28/2024 04:24:00 PM   TECHNIQUE: Multiplanar multisequence MRI of the lumbar spine was performed without the administration of intravenous contrast.   COMPARISON: None available.   CLINICAL HISTORY: Low back pain, symptoms persist with > 6 wks treatment. Lumbar spine wo; LBP radiating to both buttocks down both legs for years, getting worse; Hx of falling off of a tractor years ago, Bilateral leg numbness; No bowel/ bladder changes, no injections, no prior spine sx, no hx of CA, ; Prior x-rays   FINDINGS:   BONES AND ALIGNMENT: Normal alignment. Normal vertebral body heights. Bone marrow signal is unremarkable.   SPINAL CORD: The conus terminates normally.   SOFT TISSUES: No paraspinal mass.   L1-L2: Small disc bulge eccentric to the left with narrowing of the left lateral recess and moderate left foraminal stenosis.  L2-L3: Small disc bulge asymmetric to the left with severe narrowing of the left lateral recess, moderate left neural foraminal stenosis, and encroachment on the exiting nerve root in the extraforaminal zone.   L3-L4: Small disc bulge narrowing both lateral recesses. Moderate right foraminal stenosis.   L4-L5: Intermediate size  disc bulge with moderate bilateral foraminal stenosis and narrowing of both lateral recesses.   L5-S1: Intermediate size disc bulge with moderate right and severe left foraminal stenosis and left greater than right lateral recess narrowing.   IMPRESSION: 1. Multiple disc bulges from T10-11 through L5-S1 with varying degrees of foraminal and lateral recess stenosis, most severe at T11-12, T12-L1, L2-3, and L5-S1.   Electronically signed by: Shaun Stanford MD 02/08/2024 03:47 PM EDT RP Workstation: [id number]   He reports that he has never smoked. He has never used smokeless tobacco.  Recent Labs    12/04/23 1539  HGBA1C 5.8    Objective:  VS:  HT:    WT:   BMI:     BP:   HR: bpm  TEMP: ( )  RESP:  Physical Exam Vitals and nursing note reviewed.  HENT:     Head: Normocephalic and atraumatic.     Right Ear: External ear normal.     Left Ear: External ear normal.     Nose: Nose normal.     Mouth/Throat:     Mouth: Mucous membranes are moist.  Eyes:     Extraocular Movements: Extraocular movements intact.  Cardiovascular:     Rate and Rhythm: Normal rate.     Pulses: Normal pulses.  Pulmonary:     Effort: Pulmonary effort is normal.  Abdominal:     General: Abdomen is flat. There is no distension.  Musculoskeletal:        General: Tenderness present.     Cervical back: Normal range of motion.     Comments: Patient rises from seated position to standing without difficulty. Good lumbar range of motion. No pain noted with facet loading. 5/5 strength noted with bilateral hip flexion, knee flexion/extension, ankle dorsiflexion/plantarflexion and EHL. No clonus noted bilaterally. No pain upon palpation of greater trochanters. No pain with internal/external rotation of bilateral hips. Sensation intact bilaterally. Dysesthesias noted to bilateral L5 dermatomes. Negative slump test bilaterally. Ambulates without aid, gait steady.   Skin:    General: Skin is warm and dry.      Capillary Refill: Capillary refill takes less than 2 seconds.  Neurological:     General: No focal deficit present.     Mental Status: He is alert and oriented to person, place, and time.  Psychiatric:        Mood and Affect: Mood normal.        Behavior: Behavior normal.     Ortho Exam  Imaging: No results found.  Past Medical/Family/Surgical/Social History: Medications & Allergies reviewed per EMR, new medications updated. Patient Active Problem List   Diagnosis Date Noted   Actinic keratosis 01/04/2024   Hearing aid consultation 12/04/2023   Lesion of nose 12/04/2023   Lesion of tongue 05/27/2022   Recurrent oral ulcers 09/24/2020   Educated about COVID-19 virus infection 09/27/2019   Hyperlipidemia 02/04/2017   Allergy 01/30/2017   Encounter for Medicare annual wellness exam 01/27/2016   GERD (gastroesophageal reflux disease) 01/27/2016   Thrombocytopenia (HCC) 01/31/2015   Methadone use 01/31/2015   Myalgia 08/13/2014   Preventative health care 05/14/2013   History of oral lesions 05/14/2013   Left shoulder pain 12/16/2012  Hypertension 11/14/2012   Arm skin lesion, left 11/12/2012   Lymphadenopathy 05/22/2012   Hyperglycemia 05/14/2012   Low back pain 07/20/2008   Past Medical History:  Diagnosis Date   Allergic state 01/30/2017   Arm mass    Arm skin lesion, left 11/12/2012   And sun damaged skin   Elevated BP 11/14/2012   Encounter for Medicare annual wellness exam 01/27/2016   GERD (gastroesophageal reflux disease) 01/27/2016   Hepatitis C, chronic (HCC)    History of intravenous drug use in remission    History of oral lesions 05/14/2013   History of oral lesions 07/29/2015   Hypertension 11/14/2012   Left shoulder pain 12/16/2012   Methadone use 01/31/2015   Myalgia and myositis 08/13/2014   Preventative health care 05/14/2013   Preventative health care 05/14/2013   Family History  Problem Relation Age of Onset   Osteoporosis Mother    Heart disease Father     Diabetes Father    Hyperlipidemia Father    Hypertension Father    Thyroid  disease Sister    Prostate cancer Brother    Thyroid  disease Maternal Grandmother    Diabetes Paternal Grandfather    Heart disease Cousin        MI   Diabetes Cousin    Obesity Cousin    Cancer Neg Hx        negative for colon and prostate   Past Surgical History:  Procedure Laterality Date   KNEE ARTHROSCOPY  1996   ligment repair   Social History   Occupational History   Not on file  Tobacco Use   Smoking status: Never   Smokeless tobacco: Never  Vaping Use   Vaping status: Never Used  Substance and Sexual Activity   Alcohol use: No    Alcohol/week: 0.0 standard drinks of alcohol   Drug use: No   Sexual activity: Yes    Comment: avoids nuts, fruits, lives with wife, makes picture frames, building maintenance

## 2024-02-29 NOTE — Progress Notes (Signed)
 Pain Scale   Average Pain 4 Patient advising he has lower back pain radiating bilaterally to legs. Patient here for MRI review        +Driver, -BT, -Dye Allergies.

## 2024-03-04 ENCOUNTER — Other Ambulatory Visit: Payer: Self-pay | Admitting: Family Medicine

## 2024-03-04 DIAGNOSIS — K1379 Other lesions of oral mucosa: Secondary | ICD-10-CM

## 2024-03-04 DIAGNOSIS — Z8719 Personal history of other diseases of the digestive system: Secondary | ICD-10-CM

## 2024-03-04 MED ORDER — MAGIC MOUTHWASH
5.0000 mL | Freq: Four times a day (QID) | ORAL | 1 refills | Status: DC | PRN
Start: 2024-03-04 — End: 2024-03-06

## 2024-03-04 MED ORDER — FLUOCINONIDE 0.05 % EX GEL
CUTANEOUS | 0 refills | Status: DC
Start: 2024-03-04 — End: 2024-03-06

## 2024-03-04 NOTE — Telephone Encounter (Unsigned)
 Copied from CRM #[id number]. Topic: Clinical - Medication Refill >> Mar 04, 2024  3:01 PM Mia F wrote: Medication:  magic mouthwash SOLN  tiZANidine  (ZANAFLEX ) 4 MG tablet  fluocinonide  gel (LIDEX ) 0.05 %   Has the patient contacted their pharmacy? Yes (Agent: If no, request that the patient contact the pharmacy for the refill. If patient does not wish to contact the pharmacy document the reason why and proceed with request.) (Agent: If yes, when and what did the pharmacy advise?)  This is the patient's preferred pharmacy:  [facility] - [place], [place] - [address] [place] [address] Phone: [phone] Fax: [phone]  Is this the correct pharmacy for this prescription? Yes If no, delete pharmacy and type the correct one.   Has the prescription been filled recently? No  Is the patient out of the medication? Yes  Has the patient been seen for an appointment in the last year OR does the patient have an upcoming appointment? Yes  Can we respond through MyChart? Yes  Agent: Please be advised that Rx refills may take up to 3 business days. We ask that you follow-up with your pharmacy.

## 2024-03-06 MED ORDER — FLUOCINONIDE 0.05 % EX GEL
CUTANEOUS | 0 refills | Status: AC
Start: 2024-03-06 — End: ?

## 2024-03-06 MED ORDER — TIZANIDINE HCL 4 MG PO TABS: 2.0000 mg | ORAL_TABLET | Freq: Two times a day (BID) | ORAL | 3 refills | Status: AC | PRN

## 2024-03-06 MED ORDER — MAGIC MOUTHWASH
5.0000 mL | Freq: Four times a day (QID) | ORAL | 1 refills | Status: AC | PRN
Start: 2024-03-06 — End: ?

## 2024-03-06 NOTE — Telephone Encounter (Signed)
 Pt called asking for update that the meds be called in since he needs them

## 2024-03-06 NOTE — Telephone Encounter (Signed)
 Copied from CRM [id number]. Topic: Clinical - Prescription Issue >> Mar 06, 2024  9:13 AM Alfonso ORN wrote: Reason for CRM: pt called to follow up on rx requests from last week. Spoke to clinic access who will relay message to pcp to get it sent to [email or web address]

## 2024-03-09 DIAGNOSIS — F1120 Opioid dependence, uncomplicated: Secondary | ICD-10-CM | POA: Diagnosis not present

## 2024-03-26 ENCOUNTER — Ambulatory Visit: Admitting: Physical Medicine and Rehabilitation

## 2024-03-26 ENCOUNTER — Other Ambulatory Visit: Payer: Self-pay

## 2024-03-26 VITALS — BP 145/81 | HR 76

## 2024-03-26 DIAGNOSIS — M5416 Radiculopathy, lumbar region: Secondary | ICD-10-CM | POA: Diagnosis not present

## 2024-03-26 MED ORDER — METHYLPREDNISOLONE ACETATE 40 MG/ML IJ SUSP
40.0000 mg | Freq: Once | INTRAMUSCULAR | Status: AC
Start: 2024-03-26 — End: 2024-03-26
  Administered 2024-03-26: 40 mg

## 2024-03-26 NOTE — Progress Notes (Signed)
 Honor Fairbank - 73 y.o. male MRN [id number]  Date of birth: Dec 21, 1950  Office Visit Note: Visit Date: 03/26/2024 PCP: Domenica Harlene LABOR, MD Referred by: Domenica Harlene LABOR, MD  Subjective: Chief Complaint  Patient presents with   Lower Back - Pain   HPI:  Shaun [name] is a 73 y.o. male who comes in today at the request of Duwaine Pouch, FNP for planned Right L4-5 Lumbar Interlaminar epidural steroid injection with fluoroscopic guidance.  The patient has failed conservative care including home exercise, medications, time and activity modification.  This injection will be diagnostic and hopefully therapeutic.  Please see requesting physician notes for further details and justification.   ROS Otherwise per HPI.  Assessment & Plan: Visit Diagnoses:    ICD-10-CM   1. Lumbar radiculopathy  M54.16 XR C-ARM NO REPORT    Epidural Steroid injection    methylPREDNISolone  acetate (DEPO-MEDROL ) injection 40 mg      Plan: No additional findings.   Meds & Orders:  Meds ordered this encounter  Medications   methylPREDNISolone  acetate (DEPO-MEDROL ) injection 40 mg    Orders Placed This Encounter  Procedures   XR C-ARM NO REPORT   Epidural Steroid injection    Follow-up: Return for visit to requesting provider as needed.   Procedures: No procedures performed  Lumbar Epidural Steroid Injection - Interlaminar Approach with Fluoroscopic Guidance  Patient: Shaun [name]      Date of Birth: January 28, 1951 MRN: [id number] PCP: Domenica Harlene LABOR, MD      Visit Date: 03/26/2024   Universal Protocol:     Consent Given By: the patient  Position: PRONE  Additional Comments: Vital signs were monitored before and after the procedure. Patient was prepped and draped in the usual sterile fashion. The correct patient, procedure, and site was verified.   Injection Procedure Details:   Procedure diagnoses: Lumbar radiculopathy [M54.16]   Meds Administered:  Meds ordered this  encounter  Medications   methylPREDNISolone  acetate (DEPO-MEDROL ) injection 40 mg     Laterality: Right  Location/Site:  L5-S1  Needle: 3.5 in., 20 ga. Tuohy  Needle Placement: Paramedian epidural  Findings:   -Comments: Excellent flow of contrast into the epidural space.  Initial placement at L4-5 was unable to get loss of resistance from the angle and disc height loss creating some bony narrowing to entry into the epidural space.  I did put the medication at L5-S1 just 1 level below and he went in with these and with good flow contrast even through the L4-5 region.  Procedure Details: Using a paramedian approach from the side mentioned above, the region overlying the inferior lamina was localized under fluoroscopic visualization and the soft tissues overlying this structure were infiltrated with 4 ml. of 1% Lidocaine  without Epinephrine. The Tuohy needle was inserted into the epidural space using a paramedian approach.   The epidural space was localized using loss of resistance along with counter oblique bi-planar fluoroscopic views.  After negative aspirate for air, blood, and CSF, a 2 ml. volume of Isovue-250 was injected into the epidural space and the flow of contrast was observed. Radiographs were obtained for documentation purposes.    The injectate was administered into the level noted above.   Additional Comments:  The patient tolerated the procedure well Dressing: 2 x 2 sterile gauze and Band-Aid    Post-procedure details: Patient was observed during the procedure. Post-procedure instructions were reviewed.  Patient left the clinic in stable condition.   Clinical History: Narrative & Impression EXAM:  MRI LUMBAR SPINE 01/28/2024 04:24:00 PM   TECHNIQUE: Multiplanar multisequence MRI of the lumbar spine was performed without the administration of intravenous contrast.   COMPARISON: None available.   CLINICAL HISTORY: Low back pain, symptoms persist with > 6  wks treatment. Lumbar spine wo; LBP radiating to both buttocks down both legs for years, getting worse; Hx of falling off of a tractor years ago, Bilateral leg numbness; No bowel/ bladder changes, no injections, no prior spine sx, no hx of CA, ; Prior x-rays   FINDINGS:   BONES AND ALIGNMENT: Normal alignment. Normal vertebral body heights. Bone marrow signal is unremarkable.   SPINAL CORD: The conus terminates normally.   SOFT TISSUES: No paraspinal mass.   L1-L2: Small disc bulge eccentric to the left with narrowing of the left lateral recess and moderate left foraminal stenosis.   L2-L3: Small disc bulge asymmetric to the left with severe narrowing of the left lateral recess, moderate left neural foraminal stenosis, and encroachment on the exiting nerve root in the extraforaminal zone.   L3-L4: Small disc bulge narrowing both lateral recesses. Moderate right foraminal stenosis.   L4-L5: Intermediate size disc bulge with moderate bilateral foraminal stenosis and narrowing of both lateral recesses.   L5-S1: Intermediate size disc bulge with moderate right and severe left foraminal stenosis and left greater than right lateral recess narrowing.   IMPRESSION: 1. Multiple disc bulges from T10-11 through L5-S1 with varying degrees of foraminal and lateral recess stenosis, most severe at T11-12, T12-L1, L2-3, and L5-S1.   Electronically signed by: Franky Stanford MD 02/08/2024 03:47 PM EDT RP Workstation: [id number]     Objective:  VS:  HT:    WT:   BMI:     BP:(!) 145/81  HR:76bpm  TEMP: ( )  RESP:  Physical Exam Vitals and nursing note reviewed.  Constitutional:      General: He is not in acute distress.    Appearance: Normal appearance. He is not ill-appearing.  HENT:     Head: Normocephalic and atraumatic.     Right Ear: External ear normal.     Left Ear: External ear normal.     Nose: No congestion.  Eyes:     Extraocular Movements: Extraocular movements  intact.  Cardiovascular:     Rate and Rhythm: Normal rate.     Pulses: Normal pulses.  Pulmonary:     Effort: Pulmonary effort is normal. No respiratory distress.  Abdominal:     General: There is no distension.     Palpations: Abdomen is soft.  Musculoskeletal:        General: No tenderness or signs of injury.     Cervical back: Neck supple.     Right lower leg: No edema.     Left lower leg: No edema.     Comments: Patient has good distal strength without clonus.  Skin:    Findings: No erythema or rash.  Neurological:     General: No focal deficit present.     Mental Status: He is alert and oriented to person, place, and time.     Sensory: No sensory deficit.     Motor: No weakness or abnormal muscle tone.     Coordination: Coordination normal.  Psychiatric:        Mood and Affect: Mood normal.        Behavior: Behavior normal.      Imaging: XR C-ARM NO REPORT Result Date: 03/26/2024 Please see Notes tab for imaging impression.

## 2024-03-26 NOTE — Progress Notes (Signed)
 Pain Scale   Average Pain 6 Patient advising he has lower back pain radiating to right leg, pain is constant.        +Driver, -BT, -Dye Allergies.

## 2024-03-26 NOTE — Procedures (Signed)
 Lumbar Epidural Steroid Injection - Interlaminar Approach with Fluoroscopic Guidance  Patient: Shaun [name]      Date of Birth: [birth year] MRN: [id number] PCP: Domenica Harlene LABOR, MD      Visit Date: 03/26/2024   Universal Protocol:     Consent Given By: the patient  Position: PRONE  Additional Comments: Vital signs were monitored before and after the procedure. Patient was prepped and draped in the usual sterile fashion. The correct patient, procedure, and site was verified.   Injection Procedure Details:   Procedure diagnoses: Lumbar radiculopathy [M54.16]   Meds Administered:  Meds ordered this encounter  Medications   methylPREDNISolone  acetate (DEPO-MEDROL ) injection 40 mg     Laterality: Right  Location/Site:  L5-S1  Needle: 3.5 in., 20 ga. Tuohy  Needle Placement: Paramedian epidural  Findings:   -Comments: Excellent flow of contrast into the epidural space.  Initial placement at L4-5 was unable to get loss of resistance from the angle and disc height loss creating some bony narrowing to entry into the epidural space.  I did put the medication at L5-S1 just 1 level below and he went in with these and with good flow contrast even through the L4-5 region.  Procedure Details: Using a paramedian approach from the side mentioned above, the region overlying the inferior lamina was localized under fluoroscopic visualization and the soft tissues overlying this structure were infiltrated with 4 ml. of 1% Lidocaine  without Epinephrine. The Tuohy needle was inserted into the epidural space using a paramedian approach.   The epidural space was localized using loss of resistance along with counter oblique bi-planar fluoroscopic views.  After negative aspirate for air, blood, and CSF, a 2 ml. volume of Isovue-250 was injected into the epidural space and the flow of contrast was observed. Radiographs were obtained for documentation purposes.    The injectate was administered  into the level noted above.   Additional Comments:  The patient tolerated the procedure well Dressing: 2 x 2 sterile gauze and Band-Aid    Post-procedure details: Patient was observed during the procedure. Post-procedure instructions were reviewed.  Patient left the clinic in stable condition.

## 2024-04-04 NOTE — Progress Notes (Signed)
 Shaun [name]                                          MRN: [id number]   04/04/2024   The [facility] Quality Team Specialist reviewed this patient medical record for the purposes of chart review for care gap closure. The following were reviewed: chart review for care gap closure-colorectal cancer screening.    [facility] Quality Team

## 2024-04-06 DIAGNOSIS — F1120 Opioid dependence, uncomplicated: Secondary | ICD-10-CM | POA: Diagnosis not present

## 2024-04-08 DIAGNOSIS — F1120 Opioid dependence, uncomplicated: Secondary | ICD-10-CM | POA: Diagnosis not present

## 2024-04-14 ENCOUNTER — Encounter: Payer: Self-pay | Admitting: Radiology

## 2024-04-24 DIAGNOSIS — F1120 Opioid dependence, uncomplicated: Secondary | ICD-10-CM | POA: Diagnosis not present

## 2024-05-13 ENCOUNTER — Other Ambulatory Visit: Payer: Self-pay | Admitting: Family Medicine

## 2024-05-18 DIAGNOSIS — F1120 Opioid dependence, uncomplicated: Secondary | ICD-10-CM | POA: Diagnosis not present

## 2024-05-26 ENCOUNTER — Ambulatory Visit

## 2024-05-27 ENCOUNTER — Ambulatory Visit

## 2024-05-28 DIAGNOSIS — R7303 Prediabetes: Secondary | ICD-10-CM | POA: Insufficient documentation

## 2024-05-28 NOTE — Progress Notes (Unsigned)
 Subjective:     Patient ID: Shaun [name], male    DOB: 04-16-1951, 73 y.o.   MRN: [id number]  No chief complaint on file.   HPI  Discussed the use of AI scribe software for clinical note transcription with the patient, who gave verbal consent to proceed.  History of Present Illness      History of Present Illness Shaun [name] is a 73 year old male with hypertension and back pain who presents for medication management and evaluation of a possible hernia.  He has not been checking blood pressure at home. He is on a low dose of lisinopril , last refilled a few weeks ago. His blood pressure was elevated at his last visit. He has been less active because of back pain.  He has chronic back pain. He had back injections a couple of months ago with minimal relief. Pain limits walking, standing, and sleeping on his right side. He takes tizanidine  mainly at night, which improves pain enough for him to sleep.  He has noticed new swelling above the right groin for a couple of months. It feels like pressure, is nonpainful, and is only on the right. He has not had similar symptoms before.  He reports significant stress. His 44 year old mother has worsening dementia with forgetfulness and personality changes. His wife is in treatment for lung cancer and plans to start radiation after Christmas. Overall he feels he is managing this well despite these life stressors.  Patient denies fever, chills, SOB, CP, palpitations, dyspnea, edema, HA, vision changes, N/V/D, abdominal pain, urinary symptoms, rash, weight changes, and recent illness or hospitalizations.      12/04/2023    3:41 PM 11/30/2022    9:09 AM 05/25/2022    9:11 AM  GAD 7 : Generalized Anxiety Score  Nervous, Anxious, on Edge 1 0 0  Control/stop worrying 1 0 0  Worry too much - different things 1 0 0  Trouble relaxing 0 0 0  Restless 0 0 0  Easily annoyed or irritable 1 0 0  Afraid - awful might happen 0 0 0  Total GAD 7  Score 4 0 0  Anxiety Difficulty Not difficult at all Not difficult at all Not difficult at all        05/29/2024    9:47 AM 12/04/2023    3:41 PM 11/30/2022    9:09 AM 10/04/2022    9:13 AM 05/25/2022    9:11 AM  Depression screen PHQ 2/9  Decreased Interest 0 0 0 0 0  Down, Depressed, Hopeless 1 1 0 0 0  PHQ - 2 Score 1 1 0 0 0  Altered sleeping  1 0    Tired, decreased energy  1 0    Change in appetite  0 0    Feeling bad or failure about yourself   0 0    Trouble concentrating  0 0    Moving slowly or fidgety/restless  0     Suicidal thoughts  0 0    PHQ-9 Score  3  0     Difficult doing work/chores  Not difficult at all Not difficult at all       Data saved with a previous flowsheet row definition        Back injections helping?    Patient denies fever, chills, SOB, CP, palpitations, dyspnea, edema, HA, vision changes, N/V/D, abdominal pain, urinary symptoms, rash, weight changes, and recent illness or hospitalizations.   Health Maintenance Due  Topic Date  Due   Medicare Annual Wellness (AWV)  10/04/2023    Past Medical History:  Diagnosis Date   Allergic state 01/30/2017   Arm mass    Arm skin lesion, left 11/12/2012   And sun damaged skin   Elevated BP 11/14/2012   Encounter for Medicare annual wellness exam 01/27/2016   GERD (gastroesophageal reflux disease) 01/27/2016   Hepatitis C, chronic (HCC)    History of intravenous drug use in remission    History of oral lesions 05/14/2013   History of oral lesions 07/29/2015   Hypertension 11/14/2012   Left shoulder pain 12/16/2012   Methadone use 01/31/2015   Myalgia and myositis 08/13/2014   Preventative health care 05/14/2013   Preventative health care 05/14/2013    Past Surgical History:  Procedure Laterality Date   KNEE ARTHROSCOPY  1996   ligment repair    Family History  Problem Relation Age of Onset   Osteoporosis Mother    Heart disease Father    Diabetes Father    Hyperlipidemia Father    Hypertension  Father    Thyroid  disease Sister    Prostate cancer Brother    Thyroid  disease Maternal Grandmother    Diabetes Paternal Grandfather    Heart disease Cousin        MI   Diabetes Cousin    Obesity Cousin    Cancer Neg Hx        negative for colon and prostate    Social History   Socioeconomic History   Marital status: Married    Spouse name: Not on file   Number of children: Not on file   Years of education: Not on file   Highest education level: Not on file  Occupational History   Not on file  Tobacco Use   Smoking status: Never   Smokeless tobacco: Never  Vaping Use   Vaping status: Never Used  Substance and Sexual Activity   Alcohol use: No    Alcohol/week: 0.0 standard drinks of alcohol   Drug use: No   Sexual activity: Yes    Comment: avoids nuts, fruits, lives with wife, makes picture frames, building maintenance  Other Topics Concern   Not on file  Social History Narrative   Not on file   Social Drivers of Health   Tobacco Use: Low Risk (05/29/2024)   Patient History    Smoking Tobacco Use: Never    Smokeless Tobacco Use: Never    Passive Exposure: Not on file  Financial Resource Strain: Low Risk (10/03/2021)   Overall Financial Resource Strain (CARDIA)    Difficulty of Paying Living Expenses: Not hard at all  Food Insecurity: No Food Insecurity (10/04/2022)   Hunger Vital Sign    Worried About Running Out of Food in the Last Year: Never true    Ran Out of Food in the Last Year: Never true  Transportation Needs: No Transportation Needs (10/04/2022)   PRAPARE - Administrator, Civil Service (Medical): No    Lack of Transportation (Non-Medical): No  Physical Activity: Sufficiently Active (10/03/2021)   Exercise Vital Sign    Days of Exercise per Week: 7 days    Minutes of Exercise per Session: 30 min  Stress: No Stress Concern Present (10/03/2021)   [employer] of Occupational Health - Occupational Stress Questionnaire    Feeling of  Stress : Only a little  Social Connections: Moderately Isolated (10/03/2021)   Social Connection and Isolation Panel    Frequency of Communication with  Friends and Family: More than three times a week    Frequency of Social Gatherings with Friends and Family: More than three times a week    Attends Religious Services: Never    Database Administrator or Organizations: No    Attends Banker Meetings: Never    Marital Status: Married  Catering Manager Violence: Not At Risk (10/04/2022)   Humiliation, Afraid, Rape, and Kick questionnaire    Fear of Current or Ex-Partner: No    Emotionally Abused: No    Physically Abused: No    Sexually Abused: No  Depression (PHQ2-9): Low Risk (05/29/2024)   Depression (PHQ2-9)    PHQ-2 Score: 1  Alcohol Screen: Low Risk (10/04/2022)   Alcohol Screen    Last Alcohol Screening Score (AUDIT): 0  Housing: Low Risk (10/04/2022)   Housing    Last Housing Risk Score: 0  Utilities: Not At Risk (10/04/2022)   AHC Utilities    Threatened with loss of utilities: No  Health Literacy: Not on file    Outpatient Medications Prior to Visit  Medication Sig Dispense Refill   fluocinonide  gel (LIDEX ) 0.05 % APPLY TO TONGUE TWICE DAILY AS NEEDED 60 g 0   methadone (DOLOPHINE) 10 MG/5ML solution Take 8 mg by mouth every morning.     Multiple Vitamin (MULTIVITAMIN) tablet Take 1 tablet by mouth every morning.      tiZANidine  (ZANAFLEX ) 4 MG tablet Take 0.5-1 tablets (2-4 mg total) by mouth 2 (two) times daily as needed for muscle spasms. AS NEEDED FOR MUSCLE SPASMS 30 tablet 3   lisinopril  (ZESTRIL ) 10 MG tablet Take 1 tablet (10 mg total) by mouth daily. Needs appt 90 tablet 0   magic mouthwash SOLN Take 5 mLs by mouth 4 (four) times daily as needed for mouth pain. Swish and spit 240 mL 1   No facility-administered medications prior to visit.    Allergies[1]  ROS See HPI    Objective:    Physical Exam Vitals reviewed.  Constitutional:       General: He is not in acute distress.    Appearance: He is not toxic-appearing.  HENT:     Head: Normocephalic and atraumatic.     Mouth/Throat:     Mouth: Mucous membranes are moist.     Pharynx: Oropharynx is clear.  Eyes:     Pupils: Pupils are equal, round, and reactive to light.  Cardiovascular:     Rate and Rhythm: Normal rate and regular rhythm.     Pulses: Normal pulses.     Heart sounds: Normal heart sounds. No murmur heard. Pulmonary:     Effort: Pulmonary effort is normal. No respiratory distress.     Breath sounds: Normal breath sounds. No wheezing.  Abdominal:     General: Abdomen is flat. Bowel sounds are normal. There is no distension.     Palpations: Abdomen is soft. There is no mass.     Tenderness: There is no abdominal tenderness. There is no guarding.     Hernia: A hernia is present.     Comments: Right inguinal hernia palpated, NTWP  Musculoskeletal:        General: No swelling.     Cervical back: Neck supple.  Skin:    General: Skin is warm and dry.  Neurological:     General: No focal deficit present.     Mental Status: He is alert and oriented to person, place, and time.  Psychiatric:  Mood and Affect: Mood normal.        Behavior: Behavior normal.        Thought Content: Thought content normal.        Judgment: Judgment normal.      BP (!) 149/71   Pulse 72   Ht 6' (1.829 m)   Wt 162 lb 9.6 oz (73.8 kg)   SpO2 100%   BMI 22.05 kg/m  Wt Readings from Last 3 Encounters:  05/29/24 162 lb 9.6 oz (73.8 kg)  12/04/23 160 lb 9.6 oz (72.8 kg)  07/02/23 164 lb (74.4 kg)       Assessment & Plan:   Problem List Items Addressed This Visit       Cardiovascular and Mediastinum   Hypertension   Increase lisinopril  to 20 mg daily FU 2 weeks nurse visit recheck BP and BMP Encouraged heart healthy diet such as the DASH diet and exercise as tolerated.        Relevant Medications   lisinopril  (ZESTRIL ) 20 MG tablet   Other Relevant Orders    Basic Metabolic Panel (BMET)     Digestive   History of oral lesions - Primary   Less frequent since finishing treatments for Hep C but still happens occasionally. Manged with flucinonide gel (Lidex ) 0.05%. Recent received refill.       Recurrent oral ulcers   Recurrent oral ulcers triggered by certain foods. Managed with Magic Mouthwash as needed. -Refill Magic Mouthwash         Endocrine   Hyperglycemia   Relevant Orders   Comp Met (CMET)   HgB A1c     Other   Hyperlipidemia   Upate lipid panel today.  Encourage heart healthy diet such as MIND or DASH diet, increase exercise, avoid trans fats, simple carbohydrates and processed foods, consider a krill or fish or flaxseed oil cap daily.        Relevant Medications   lisinopril  (ZESTRIL ) 20 MG tablet   Inguinal hernia without obstruction or gangrene   Refer to general surgery for further evaluation      Relevant Orders   Ambulatory referral to General Surgery   Low back pain   Following with Orthopedics, receiving steroid injections and pain managed with zanaflex  prn.  MRI Lumbar Spine 01/2024 IMPRESSION: 1. Multiple disc bulges from T10-11 through L5-S1 with varying degrees of foraminal and lateral recess stenosis, most severe at T11-12, T12-L1, L2-3, and L5-S1.      Prediabetes   Last hgba1c acceptable, minimize simple carbs. Increase exercise as tolerated. Continue current meds       Chronic low back pain Pain persists, affecting sleep and mobility. Previous injections provided minimal relief. Surgery not pursued currently. Tizanidine  used at night. - Continue tizanidine  at night. - Consider surgical consultation if symptoms worsen or circumstances change.  FU 2 weeks RN visit for BP recheck and labwork  FU 4 months chronic conditions  I have discontinued Derrian Kannan's magic mouthwash and lisinopril . I am also having him start on lisinopril  and magic mouthwash. Additionally, I am having him maintain  his methadone, multivitamin, tiZANidine , and fluocinonide  gel.  Meds ordered this encounter  Medications   lisinopril  (ZESTRIL ) 20 MG tablet    Sig: Take 1 tablet (20 mg total) by mouth daily.    Dispense:  90 tablet    Refill:  2    Supervising Provider:   DOMENICA BLACKBIRD A [[id number]]   magic mouthwash SOLN    Sig: Take 5 mLs by  mouth 4 (four) times daily as needed for mouth pain. Swish and spit    Dispense:  240 mL    Refill:  1    ([facility]) Diphenhydramine (12.5ml/5ml) 210ml Nystatin (100,000 units/75ml), 30ml Hydrocortisone 60mg  tablet    Supervising Provider:   DOMENICA BLACKBIRD A [[id number]]      [1]  Allergies Allergen Reactions   Tomato Other (See Comments)    Mouth

## 2024-05-28 NOTE — Assessment & Plan Note (Signed)
 Less frequent since finishing treatments for Hep C but still happens occasionally. Manged with flucinonide gel (Lidex ) 0.05%. Recent received refill.

## 2024-05-28 NOTE — Assessment & Plan Note (Signed)
 Well controlled, no changes to meds.  Continue lisinopril  10 mg daily. Encouraged heart healthy diet such as the DASH diet and exercise as tolerated.

## 2024-05-28 NOTE — Assessment & Plan Note (Signed)
 Upate lipid panel today.  Encourage heart healthy diet such as MIND or DASH diet, increase exercise, avoid trans fats, simple carbohydrates and processed foods, consider a krill or fish or flaxseed oil cap daily.

## 2024-05-28 NOTE — Assessment & Plan Note (Signed)
 Last hgba1c acceptable, minimize simple carbs. Increase exercise as tolerated. Continue current meds

## 2024-05-28 NOTE — Assessment & Plan Note (Signed)
 Following with Orthopedics, receiving steroid injections and pain managed with zanaflex  prn.  MRI Lumbar Spine 01/2024 IMPRESSION: 1. Multiple disc bulges from T10-11 through L5-S1 with varying degrees of foraminal and lateral recess stenosis, most severe at T11-12, T12-L1, L2-3, and L5-S1.

## 2024-05-29 ENCOUNTER — Ambulatory Visit: Admitting: Student

## 2024-05-29 ENCOUNTER — Encounter: Payer: Self-pay | Admitting: Student

## 2024-05-29 VITALS — BP 149/71 | HR 72 | Ht 72.0 in | Wt 162.6 lb

## 2024-05-29 DIAGNOSIS — K1379 Other lesions of oral mucosa: Secondary | ICD-10-CM

## 2024-05-29 DIAGNOSIS — M545 Low back pain, unspecified: Secondary | ICD-10-CM | POA: Diagnosis not present

## 2024-05-29 DIAGNOSIS — R739 Hyperglycemia, unspecified: Secondary | ICD-10-CM

## 2024-05-29 DIAGNOSIS — Z8719 Personal history of other diseases of the digestive system: Secondary | ICD-10-CM

## 2024-05-29 DIAGNOSIS — I10 Essential (primary) hypertension: Secondary | ICD-10-CM

## 2024-05-29 DIAGNOSIS — R7303 Prediabetes: Secondary | ICD-10-CM | POA: Diagnosis not present

## 2024-05-29 DIAGNOSIS — K4090 Unilateral inguinal hernia, without obstruction or gangrene, not specified as recurrent: Secondary | ICD-10-CM | POA: Diagnosis not present

## 2024-05-29 DIAGNOSIS — E785 Hyperlipidemia, unspecified: Secondary | ICD-10-CM

## 2024-05-29 DIAGNOSIS — G8929 Other chronic pain: Secondary | ICD-10-CM

## 2024-05-29 MED ORDER — MAGIC MOUTHWASH: 5.0000 mL | Freq: Four times a day (QID) | ORAL | 1 refills | Status: AC | PRN

## 2024-05-29 MED ORDER — LISINOPRIL 20 MG PO TABS: 20.0000 mg | ORAL_TABLET | Freq: Every day | ORAL | 2 refills | Status: AC

## 2024-05-29 NOTE — Assessment & Plan Note (Signed)
 Recurrent oral ulcers triggered by certain foods. Managed with Magic Mouthwash as needed. -Refill Magic Mouthwash

## 2024-05-29 NOTE — Assessment & Plan Note (Signed)
Refer to general surgery for further evaluation

## 2024-06-06 ENCOUNTER — Ambulatory Visit: Payer: Self-pay | Admitting: General Surgery

## 2024-08-11 ENCOUNTER — Ambulatory Visit (HOSPITAL_COMMUNITY): Admit: 2024-08-11 | Admitting: General Surgery

## 2024-08-11 SURGERY — REPAIR, HERNIA, INGUINAL, ROBOT-ASSISTED, LAPAROSCOPIC, USING MESH
Anesthesia: General | Laterality: Right

## 2024-08-19 ENCOUNTER — Encounter: Admitting: Physician Assistant

## 2024-10-09 ENCOUNTER — Ambulatory Visit: Admitting: Family Medicine
